# Patient Record
Sex: Female | Born: 1950
Health system: Southern US, Community
[De-identification: ages and names within clinical notes are randomized; demographics above are authoritative.]

## PROBLEM LIST (undated history)

## (undated) DIAGNOSIS — F329 Major depressive disorder, single episode, unspecified: Secondary | ICD-10-CM

## (undated) DIAGNOSIS — I10 Essential (primary) hypertension: Secondary | ICD-10-CM

## (undated) DIAGNOSIS — E119 Type 2 diabetes mellitus without complications: Secondary | ICD-10-CM

## (undated) DIAGNOSIS — I499 Cardiac arrhythmia, unspecified: Secondary | ICD-10-CM

## (undated) DIAGNOSIS — F32A Depression, unspecified: Secondary | ICD-10-CM

## (undated) DIAGNOSIS — D649 Anemia, unspecified: Secondary | ICD-10-CM

## (undated) DIAGNOSIS — F419 Anxiety disorder, unspecified: Secondary | ICD-10-CM

## (undated) DIAGNOSIS — M1990 Unspecified osteoarthritis, unspecified site: Secondary | ICD-10-CM

## (undated) HISTORY — PX: ABDOMINAL SURGERY: SHX537

## (undated) HISTORY — DX: Essential (primary) hypertension: I10

## (undated) HISTORY — PX: ABDOMINAL HYSTERECTOMY: SHX81

## (undated) HISTORY — PX: REFRACTIVE SURGERY: SHX103

## (undated) HISTORY — PX: CHOLECYSTECTOMY: SHX55

---

## 2005-12-01 ENCOUNTER — Emergency Department (HOSPITAL_COMMUNITY): Admission: EM | Admit: 2005-12-01 | Discharge: 2005-12-01 | Payer: Self-pay | Admitting: Emergency Medicine

## 2006-01-24 ENCOUNTER — Ambulatory Visit (HOSPITAL_COMMUNITY): Admission: RE | Admit: 2006-01-24 | Discharge: 2006-01-24 | Payer: Self-pay | Admitting: Family Medicine

## 2007-06-11 ENCOUNTER — Ambulatory Visit (HOSPITAL_COMMUNITY): Admission: RE | Admit: 2007-06-11 | Discharge: 2007-06-11 | Payer: Self-pay | Admitting: Family Medicine

## 2007-11-05 ENCOUNTER — Ambulatory Visit (HOSPITAL_COMMUNITY): Admission: RE | Admit: 2007-11-05 | Discharge: 2007-11-05 | Payer: Self-pay | Admitting: Obstetrics and Gynecology

## 2009-03-04 ENCOUNTER — Ambulatory Visit (HOSPITAL_COMMUNITY): Admission: RE | Admit: 2009-03-04 | Discharge: 2009-03-04 | Payer: Self-pay | Admitting: Family Medicine

## 2012-02-26 ENCOUNTER — Other Ambulatory Visit (HOSPITAL_COMMUNITY): Payer: Self-pay | Admitting: Family Medicine

## 2012-02-26 DIAGNOSIS — Z139 Encounter for screening, unspecified: Secondary | ICD-10-CM

## 2012-03-06 ENCOUNTER — Telehealth: Payer: Self-pay

## 2012-03-06 NOTE — Telephone Encounter (Signed)
LMOM to call.

## 2012-03-11 NOTE — Telephone Encounter (Signed)
LMOM to call.

## 2012-03-12 NOTE — Telephone Encounter (Signed)
Pt called and said that her husband has been in [facility] and having some problems at this time. She will wait til later and schedule colonoscopy. I will send letter to PCP. She will call when ready. York Spaniel she is not having any problems).

## 2012-04-01 ENCOUNTER — Ambulatory Visit (HOSPITAL_COMMUNITY)
Admission: RE | Admit: 2012-04-01 | Discharge: 2012-04-01 | Disposition: A | Discharge status: Home / Self Care | Payer: BC Managed Care – PPO | Source: Ambulatory Visit | Attending: Family Medicine | Admitting: Family Medicine

## 2012-04-01 DIAGNOSIS — Z139 Encounter for screening, unspecified: Secondary | ICD-10-CM

## 2012-04-01 DIAGNOSIS — Z1231 Encounter for screening mammogram for malignant neoplasm of breast: Secondary | ICD-10-CM | POA: Insufficient documentation

## 2012-04-04 ENCOUNTER — Other Ambulatory Visit: Payer: Self-pay | Admitting: Family Medicine

## 2012-04-04 DIAGNOSIS — R928 Other abnormal and inconclusive findings on diagnostic imaging of breast: Secondary | ICD-10-CM

## 2012-04-16 ENCOUNTER — Other Ambulatory Visit: Payer: Self-pay | Admitting: Family Medicine

## 2012-04-16 ENCOUNTER — Ambulatory Visit (HOSPITAL_COMMUNITY)
Admission: RE | Admit: 2012-04-16 | Discharge: 2012-04-16 | Disposition: A | Discharge status: Home / Self Care | Payer: BC Managed Care – PPO | Source: Ambulatory Visit | Attending: Family Medicine | Admitting: Family Medicine

## 2012-04-16 DIAGNOSIS — R928 Other abnormal and inconclusive findings on diagnostic imaging of breast: Secondary | ICD-10-CM

## 2012-04-16 DIAGNOSIS — N6009 Solitary cyst of unspecified breast: Secondary | ICD-10-CM | POA: Insufficient documentation

## 2012-11-18 ENCOUNTER — Other Ambulatory Visit (HOSPITAL_COMMUNITY): Payer: Self-pay | Admitting: Family Medicine

## 2012-11-18 DIAGNOSIS — Z139 Encounter for screening, unspecified: Secondary | ICD-10-CM

## 2012-11-19 ENCOUNTER — Other Ambulatory Visit (HOSPITAL_COMMUNITY): Payer: Self-pay | Admitting: Family Medicine

## 2012-11-19 DIAGNOSIS — M810 Age-related osteoporosis without current pathological fracture: Secondary | ICD-10-CM

## 2012-11-19 DIAGNOSIS — Z09 Encounter for follow-up examination after completed treatment for conditions other than malignant neoplasm: Secondary | ICD-10-CM

## 2012-11-19 DIAGNOSIS — Z139 Encounter for screening, unspecified: Secondary | ICD-10-CM

## 2012-11-20 ENCOUNTER — Ambulatory Visit (HOSPITAL_COMMUNITY)
Admission: RE | Admit: 2012-11-20 | Discharge: 2012-11-20 | Disposition: A | Discharge status: Home / Self Care | Payer: BC Managed Care – PPO | Source: Ambulatory Visit | Attending: Family Medicine | Admitting: Family Medicine

## 2012-11-20 DIAGNOSIS — Z780 Asymptomatic menopausal state: Secondary | ICD-10-CM | POA: Insufficient documentation

## 2012-11-20 DIAGNOSIS — M810 Age-related osteoporosis without current pathological fracture: Secondary | ICD-10-CM

## 2012-11-20 DIAGNOSIS — Z139 Encounter for screening, unspecified: Secondary | ICD-10-CM

## 2012-11-26 ENCOUNTER — Ambulatory Visit (HOSPITAL_COMMUNITY)
Admission: RE | Admit: 2012-11-26 | Discharge: 2012-11-26 | Disposition: A | Discharge status: Home / Self Care | Payer: BC Managed Care – PPO | Source: Ambulatory Visit | Attending: Family Medicine | Admitting: Family Medicine

## 2012-11-26 DIAGNOSIS — Z09 Encounter for follow-up examination after completed treatment for conditions other than malignant neoplasm: Secondary | ICD-10-CM

## 2012-11-26 DIAGNOSIS — R928 Other abnormal and inconclusive findings on diagnostic imaging of breast: Secondary | ICD-10-CM | POA: Insufficient documentation

## 2012-11-27 ENCOUNTER — Telehealth: Payer: Self-pay

## 2012-11-27 NOTE — Telephone Encounter (Signed)
Pt was referred to [place] some time back and letter was mailed in February. Pt's husband had been sick with cancer and has passed. Patient is ready now to schedule her colonoscopy and would like to have it before the end of the year if possible. [phone]

## 2012-12-02 NOTE — Telephone Encounter (Signed)
Called pt and she said she is not quite sure she wants to do it just yet, let her think about it and she will give me a call back.

## 2012-12-09 ENCOUNTER — Other Ambulatory Visit (INDEPENDENT_AMBULATORY_CARE_PROVIDER_SITE_OTHER): Payer: Self-pay | Admitting: *Deleted

## 2012-12-09 ENCOUNTER — Telehealth (INDEPENDENT_AMBULATORY_CARE_PROVIDER_SITE_OTHER): Payer: Self-pay | Admitting: *Deleted

## 2012-12-09 ENCOUNTER — Encounter (INDEPENDENT_AMBULATORY_CARE_PROVIDER_SITE_OTHER): Payer: Self-pay | Admitting: *Deleted

## 2012-12-09 DIAGNOSIS — Z1211 Encounter for screening for malignant neoplasm of colon: Secondary | ICD-10-CM

## 2012-12-09 MED ORDER — PEG-KCL-NACL-NASULF-NA ASC-C 100 G PO SOLR: 1.0000 | Freq: Once | ORAL | Status: DC

## 2012-12-09 NOTE — Telephone Encounter (Signed)
Patient needs movi prep 

## 2012-12-09 NOTE — Telephone Encounter (Signed)
  Procedure: tcs  Reason/Indication:  screening  Has patient had this procedure before?  no  If so, when, by whom and where?    Is there a family history of colon cancer?  no  Who?  What age when diagnosed?    Is patient diabetic?   Pre-diabetic      Does patient have prosthetic heart valve?  no  Do you have a pacemaker?  no  Has patient ever had endocarditis? no  Has patient had joint replacement within last 12 months?  no  Does patient tend to be constipated or take laxatives? no  Is patient on Coumadin, Plavix and/or Aspirin? yes  Medications: asa 81 mg daily, quinapril 40 mg daily, taztia 360 mg daily, hctz 25 mg daily, fish ol daily, alprazolam 0.5 mg prn  Allergies:   Medication Adjustment: asa 2 days  Procedure date & time: 01/02/13 at 825

## 2012-12-09 NOTE — Telephone Encounter (Signed)
agree

## 2012-12-23 NOTE — Telephone Encounter (Signed)
Pt is scheduled with Dr. Karilyn Cota for colonoscopy on 01/02/2013 per epic. Reminder letter that I wrote to her was not mailed.

## 2012-12-24 ENCOUNTER — Encounter (HOSPITAL_COMMUNITY): Payer: Self-pay | Admitting: Pharmacy Technician

## 2013-01-02 ENCOUNTER — Ambulatory Visit (HOSPITAL_COMMUNITY)
Admission: RE | Admit: 2013-01-02 | Discharge: 2013-01-02 | Disposition: A | Discharge status: Home / Self Care | Payer: BC Managed Care – PPO | Source: Ambulatory Visit | Attending: Internal Medicine | Admitting: Internal Medicine

## 2013-01-02 ENCOUNTER — Encounter (HOSPITAL_COMMUNITY): Payer: Self-pay | Admitting: *Deleted

## 2013-01-02 ENCOUNTER — Encounter (HOSPITAL_COMMUNITY)
Admission: RE | Disposition: A | Discharge status: Home / Self Care | Payer: Self-pay | Source: Ambulatory Visit | Attending: Internal Medicine

## 2013-01-02 DIAGNOSIS — D126 Benign neoplasm of colon, unspecified: Secondary | ICD-10-CM | POA: Insufficient documentation

## 2013-01-02 DIAGNOSIS — Z7982 Long term (current) use of aspirin: Secondary | ICD-10-CM | POA: Insufficient documentation

## 2013-01-02 DIAGNOSIS — Z79899 Other long term (current) drug therapy: Secondary | ICD-10-CM | POA: Insufficient documentation

## 2013-01-02 DIAGNOSIS — K644 Residual hemorrhoidal skin tags: Secondary | ICD-10-CM

## 2013-01-02 DIAGNOSIS — I10 Essential (primary) hypertension: Secondary | ICD-10-CM | POA: Insufficient documentation

## 2013-01-02 DIAGNOSIS — Z1211 Encounter for screening for malignant neoplasm of colon: Secondary | ICD-10-CM

## 2013-01-02 DIAGNOSIS — K5730 Diverticulosis of large intestine without perforation or abscess without bleeding: Secondary | ICD-10-CM | POA: Insufficient documentation

## 2013-01-02 HISTORY — DX: Depression, unspecified: F32.A

## 2013-01-02 HISTORY — DX: Essential (primary) hypertension: I10

## 2013-01-02 HISTORY — DX: Major depressive disorder, single episode, unspecified: F32.9

## 2013-01-02 HISTORY — PX: COLONOSCOPY: SHX5424

## 2013-01-02 HISTORY — DX: Anxiety disorder, unspecified: F41.9

## 2013-01-02 SURGERY — COLONOSCOPY
Anesthesia: Moderate Sedation

## 2013-01-02 MED ORDER — STERILE WATER FOR IRRIGATION IR SOLN
Status: DC | PRN
  Administered 2013-01-02: 09:00:00

## 2013-01-02 MED ORDER — MEPERIDINE HCL 50 MG/ML IJ SOLN
INTRAMUSCULAR | Status: DC | PRN
  Administered 2013-01-02 (×2): 25 mg via INTRAVENOUS

## 2013-01-02 MED ORDER — MIDAZOLAM HCL 5 MG/5ML IJ SOLN
INTRAMUSCULAR | Status: AC
  Filled 2013-01-02: qty 10

## 2013-01-02 MED ORDER — MEPERIDINE HCL 50 MG/ML IJ SOLN
INTRAMUSCULAR | Status: AC
  Filled 2013-01-02: qty 1

## 2013-01-02 MED ORDER — MIDAZOLAM HCL 5 MG/5ML IJ SOLN
INTRAMUSCULAR | Status: DC | PRN
  Administered 2013-01-02 (×4): 2 mg via INTRAVENOUS

## 2013-01-02 MED ORDER — SODIUM CHLORIDE 0.9 % IV SOLN
INTRAVENOUS | Status: DC
  Administered 2013-01-02: 08:00:00 via INTRAVENOUS

## 2013-01-02 NOTE — Op Note (Signed)
COLONOSCOPY PROCEDURE REPORT  PATIENT:  Patricia Sharp  MR#:  [id number] Birthdate:  Jun 10, 1950, 62 y.o., female Endoscopist:  Dr. Malissa Hippo, MD Referred By:  Dr. Kirk Ruths, MD  Procedure Date: 01/02/2013  Procedure:   Colonoscopy  Indications:  Patient is 62 year old Caucasian female was undergoing average risk screening colonoscopy.  Informed Consent:  The procedure and risks were reviewed with the patient and informed consent was obtained.  Medications:  Demerol 50 mg IV Versed 8 mg IV  Description of procedure:  After a digital rectal exam was performed, that colonoscope was advanced from the anus through the rectum and colon to the area of the cecum, ileocecal valve and appendiceal orifice. The cecum was deeply intubated. These structures were well-seen and photographed for the record. From the level of the cecum and ileocecal valve, the scope was slowly and cautiously withdrawn. The mucosal surfaces were carefully surveyed utilizing scope tip to flexion to facilitate fold flattening as needed. The scope was pulled down into the rectum where a thorough exam including retroflexion was performed.  Findings:   Prep satisfactory. Two small polyps removed from the sigmoid colon. One polyp was cold snared and other removed using cold biopsy forceps. Few diverticula at sigmoid colon. Normal rectal mucosa. Small hemorrhoids below the dentate line and anal papillae.   Therapeutic/Diagnostic Maneuvers Performed:  See above  Complications:  None  Cecal Withdrawal Time:  11 minutes  Impression:  Examination performed to cecum. Two small polyps removed from the sigmoid colon; one was cold snared and other one removed using biopsy forceps. Mild sigmoid colon diverticulosis. Small external hemorrhoids and anal papillae.  Recommendations:  Standard instructions given. I will contact patient with biopsy results and further recommendations.  Kaysa Roulhac U  01/02/2013  9:00 AM  CC: Dr. Kirk Ruths, MD & Dr. Bonnetta Barry ref. provider found

## 2013-01-02 NOTE — H&P (Signed)
Patricia Sharp is an 62 y.o. female.   Chief Complaint: Patient is here for colonoscopy. HPI: Patient is 10-year-old Caucasian female who is here for screening colonoscopy. She denies abdominal pain change in her bowel habits or rectal bleeding. Family history is negative for CRC. This is patient's first exam.  Past Medical History  Diagnosis Date  . Anxiety   . Hypertension   . Depression     Past Surgical History  Procedure Laterality Date  . Abdominal hysterectomy    . Abdominal surgery      to remove fibroid tumors  . Refractive surgery Bilateral     Family History  Problem Relation Age of Onset  . Colon cancer Neg Hx    Social History:  reports that she has never smoked. She does not have any smokeless tobacco history on file. She reports that she does not drink alcohol or use illicit drugs.  Allergies: No Known Allergies  Medications Prior to Admission  Medication Sig Dispense Refill  . ALPRAZolam (XANAX) 0.5 MG tablet Take 0.5 mg by mouth at bedtime as needed for anxiety.      Marland Kitchen diltiazem (TIAZAC) 360 MG 24 hr capsule Take 360 mg by mouth daily.      . hydrochlorothiazide (HYDRODIURIL) 25 MG tablet Take 25 mg by mouth daily.      . Omega-3 Fatty Acids (FISH OIL) 1000 MG CAPS Take 1,000 mg by mouth daily.      . peg 3350 powder (MOVIPREP) 100 G SOLR Take 1 kit (200 g total) by mouth once.  1 kit  0  . quinapril (ACCUPRIL) 40 MG tablet Take 40 mg by mouth at bedtime.      Marland Kitchen aspirin EC 81 MG tablet Take 81 mg by mouth daily.        No results found for this or any previous visit (from the past 48 hour(s)). No results found.  ROS  Blood pressure 165/68, pulse 70, temperature 97.7 F (36.5 C), temperature source Oral, resp. rate 13, height 5\' 1"  (1.549 m), weight 156 lb (70.761 kg), SpO2 97.00%. Physical Exam  Constitutional: She appears well-developed and well-nourished.  HENT:  Mouth/Throat: Oropharynx is clear and moist.  Eyes: Conjunctivae are normal. No scleral  icterus.  Neck: No thyromegaly present.  Cardiovascular: Normal rate, regular rhythm and normal heart sounds.   Respiratory: Effort normal and breath sounds normal.  GI: Soft. She exhibits no distension and no mass. There is no tenderness.  Musculoskeletal: She exhibits no edema.  Lymphadenopathy:    She has no cervical adenopathy.  Neurological: She is alert.  Skin: Skin is warm and dry.     Assessment/Plan Average risk screening colonoscopy.  Cayce Quezada U 01/02/2013, 8:26 AM

## 2013-01-06 ENCOUNTER — Encounter (HOSPITAL_COMMUNITY): Payer: Self-pay | Admitting: Internal Medicine

## 2013-01-13 ENCOUNTER — Encounter (INDEPENDENT_AMBULATORY_CARE_PROVIDER_SITE_OTHER): Payer: Self-pay | Admitting: *Deleted

## 2014-03-08 ENCOUNTER — Encounter: Payer: Self-pay | Admitting: Obstetrics and Gynecology

## 2014-03-08 ENCOUNTER — Ambulatory Visit (INDEPENDENT_AMBULATORY_CARE_PROVIDER_SITE_OTHER): Payer: BLUE CROSS/BLUE SHIELD | Admitting: Obstetrics and Gynecology

## 2014-03-08 VITALS — BP 140/70 | Ht 61.0 in | Wt 165.8 lb

## 2014-03-08 DIAGNOSIS — N765 Ulceration of vagina: Secondary | ICD-10-CM

## 2014-03-08 MED ORDER — ESTROGENS, CONJUGATED 0.625 MG/GM VA CREA: 0.5000 | TOPICAL_CREAM | Freq: Every day | VAGINAL | Status: DC

## 2014-03-08 MED ORDER — ACYCLOVIR 5 % EX CREA: 1.0000 "application " | TOPICAL_CREAM | CUTANEOUS | Status: DC

## 2014-03-08 NOTE — Progress Notes (Signed)
Patient ID: [name], female   DOB: [birth year], 64 y.o.   MRN: [id number]   [facility] Visit  Patient name: Patricia Sharp MRN [id number]  Date of birth: Feb 23, 1950  CC & HPI:  Patricia Sharp is a 64 y.o. female s/p abdominal hysterectomy presenting today for 3 sores on her buttocks that started last week. Pt states pain becomes worse with sitting. She notes diarrhea 1 week ago and thinks episodes may be cause of the sore. Pt is widowed and has been abstinent, but is considering becoming sexual active again. She has tight band that restricts opening of vagina. Pt denies swollen lymph nodes or similar sores around the mouth.    ROS:  A complete 10 system review of systems was obtained and all systems are negative except as noted in the HPI and PMH.    Pertinent History Reviewed:   Reviewed: Significant for abdominal hysterectomy Medical         Past Medical History  Diagnosis Date  . Anxiety   . Hypertension   . Depression                               Surgical Hx:    Past Surgical History  Procedure Laterality Date  . Abdominal hysterectomy    . Abdominal surgery      to remove fibroid tumors  . Refractive surgery Bilateral   . Colonoscopy N/A 01/02/2013    Procedure: COLONOSCOPY;  Surgeon: Rogene Houston, MD;  [facility];  Service: Endoscopy;  Laterality: N/A;  825   Medications: Reviewed & Updated - see associated section                       Current outpatient prescriptions:  .  ALPRAZolam (XANAX) 0.5 MG tablet, Take 0.5 mg by mouth at bedtime as needed for anxiety., Disp: , Rfl:  .  aspirin EC 81 MG tablet, Take 81 mg by mouth daily., Disp: , Rfl:  .  diltiazem (TIAZAC) 360 MG 24 hr capsule, Take 360 mg by mouth daily., Disp: , Rfl:  .  hydrochlorothiazide (HYDRODIURIL) 25 MG tablet, Take 25 mg by mouth daily., Disp: , Rfl:  .  Omega-3 Fatty Acids (FISH OIL) 1000 MG CAPS, Take 1,000 mg by mouth daily., Disp: , Rfl:  .  quinapril (ACCUPRIL) 40 MG  tablet, Take 40 mg by mouth at bedtime., Disp: , Rfl:    Social History: Reviewed -  reports that she has never smoked. She does not have any smokeless tobacco history on file.  Objective Findings:  Vitals: Blood pressure 140/70, height 5\' 1"  (1.549 m), weight 165 lb 12.8 oz (75.206 kg).  Physical Examination: General appearance - alert, well appearing, and in no distress and oriented to person, place, and time Pelvic - normal external genitalia, vulva, vagina, cervix VULVA: normal appearing vulva with no masses, tenderness or lesions VAGINA: normal appearing vagina with normal color and discharge, no lesions; average vaginal length; atrophic tissues CERVIX: normal appearing cervix without discharge or lesions UTERUS: surgically removed ADNEXA: surgically removed Buttocks: Rapidly healing 3 mm x 3 mm ulceration on right gluteal area; 2 actively healing small 1 mm ulcerations on the left side of introitus  Assessment & Plan:   A:  1. Rapidly healing ulcerations; 1 on gluteal area and 2 left side of introitus 2. Hymen band causing vaginal tightness 3. Postmenopausal state  P:  1. Estrogen per vagina. 2. Follow-up in 2 weeks regarding hymen band 3. Topical anti-viral cream acyclovir.  This chart was scribed for Jonnie Kind, MD by Tula Nakayama, ED Scribe. This patient was seen in room 2 and the patient's care was started at 10:18 AM.   I personally performed the services described in this documentation, which was scribed in my presence. The recorded information has been reviewed and considered accurate. It has been edited as necessary during review. Jonnie Kind, MD

## 2014-03-09 LAB — HSV 2 ANTIBODY, IGG

## 2014-03-25 ENCOUNTER — Ambulatory Visit (INDEPENDENT_AMBULATORY_CARE_PROVIDER_SITE_OTHER): Payer: BC Managed Care – PPO | Admitting: Obstetrics and Gynecology

## 2014-03-25 VITALS — BP 140/76 | Ht 61.0 in | Wt 165.0 lb

## 2014-03-25 DIAGNOSIS — N9089 Other specified noninflammatory disorders of vulva and perineum: Secondary | ICD-10-CM

## 2014-03-25 NOTE — Progress Notes (Signed)
Patient ID: [name], female   DOB: [birth year], 64 y.o.   MRN: [id number] This chart was SCRIBED for Mallory Shirk, MD by Stephania Fragmin, ED Scribe. This patient was seen in room 2 and the patient's care was started at 4:46 PM.   [facility] Visit  Patient name: AMAZIAH GHOSH MRN [id number]  Date of birth: May 05, 1950 CC & HPI:  CELIE DESROCHERS is a 64 y.o. female s/p abdominal hysterectomy presenting today for follow up of sores on her buttocks and vaginal thinning after being seen by me 2.5 weeks ago, on 03/08/14. She reports that she has pain relief on her buttocks since being treated. She is continuing to use Premarin.   Patient reports having a history of Cesarean section.   ROS:  A complete 10 system review of systems was obtained and all systems are negative except as noted in the HPI and PMH.    Pertinent History Reviewed:   Reviewed: Significant for abdominal hysterectomy and removal of fibroid tumors Medical         Past Medical History  Diagnosis Date  . Anxiety   . Hypertension   . Depression                               Surgical Hx:    Past Surgical History  Procedure Laterality Date  . Abdominal hysterectomy    . Abdominal surgery      to remove fibroid tumors  . Refractive surgery Bilateral   . Colonoscopy N/A 01/02/2013    Procedure: COLONOSCOPY;  Surgeon: Rogene Houston, MD;  [facility];  Service: Endoscopy;  Laterality: N/A;  825   Medications: Reviewed & Updated - see associated section                       Current outpatient prescriptions:  .  acyclovir cream (ZOVIRAX) 5 %, Apply 1 application topically every 4 (four) hours. While awake., Disp: 15 g, Rfl: 0 .  ALPRAZolam (XANAX) 0.5 MG tablet, Take 0.5 mg by mouth at bedtime as needed for anxiety., Disp: , Rfl:  .  aspirin EC 81 MG tablet, Take 81 mg by mouth daily., Disp: , Rfl:  .  conjugated estrogens (PREMARIN) vaginal cream, Place 0.5 Applicatorfuls vaginally at bedtime. For vaginal  thinning, Disp: 42.5 g, Rfl: 12 .  diltiazem (TIAZAC) 360 MG 24 hr capsule, Take 360 mg by mouth daily., Disp: , Rfl:  .  hydrochlorothiazide (HYDRODIURIL) 25 MG tablet, Take 25 mg by mouth daily., Disp: , Rfl:  .  Omega-3 Fatty Acids (FISH OIL) 1000 MG CAPS, Take 1,000 mg by mouth daily., Disp: , Rfl:  .  quinapril (ACCUPRIL) 40 MG tablet, Take 40 mg by mouth at bedtime., Disp: , Rfl:    Social History: Reviewed -  reports that she has never smoked. She does not have any smokeless tobacco history on file.  Objective Findings:  Vitals: Blood pressure 140/76, height 5\' 1"  (1.549 m), weight 165 lb (74.844 kg).  Physical Examination: General appearance - alert, well appearing, and in no distress, oriented to person, place, and time and overweight Mental status - alert, oriented to person, place, and time, normal mood, behavior, speech, dress, motor activity, and thought processes, affect appropriate to mood   Pelvic - normal external genitalia VULVA: normal appearing vulva with no masses, tenderness or lesions,  VAGINA: Hymen band remains intact,  but does not preclude large speculum  CERVIX: normal appearing cervix without discharge or lesions,  UTERUS: uterus is normal size, shape, consistency and nontender,  ADNEXA: normal adnexa in size, nontender and no masses,   Assessment & Plan:   A:   1. Vaginal dryness S/P abdominal hysterectomy   P:  1. Will give names of lubricants for patient to use, [place], [place], etc.   I personally performed the services described in this documentation, which was SCRIBED in my presence. The recorded information has been reviewed and considered accurate. It has been edited as necessary during review. Jonnie Kind, MD

## 2014-03-26 ENCOUNTER — Ambulatory Visit: Payer: BLUE CROSS/BLUE SHIELD | Admitting: Obstetrics and Gynecology

## 2014-03-31 ENCOUNTER — Telehealth: Payer: Self-pay | Admitting: *Deleted

## 2014-04-01 ENCOUNTER — Ambulatory Visit: Payer: BC Managed Care – PPO | Admitting: Obstetrics and Gynecology

## 2014-04-01 NOTE — Telephone Encounter (Signed)
Pt states that she has something very personal that she wants to discuss with Dr. Glo Herring. Pt refused to give me any information. Pt was given appointment for in the morning with Dr. Glo Herring at 9:45.

## 2014-04-02 ENCOUNTER — Encounter: Payer: Self-pay | Admitting: Obstetrics and Gynecology

## 2014-04-02 ENCOUNTER — Ambulatory Visit (INDEPENDENT_AMBULATORY_CARE_PROVIDER_SITE_OTHER): Payer: BC Managed Care – PPO | Admitting: Obstetrics and Gynecology

## 2014-04-02 VITALS — BP 220/100 | Ht 61.0 in | Wt 169.0 lb

## 2014-04-02 DIAGNOSIS — N930 Postcoital and contact bleeding: Secondary | ICD-10-CM | POA: Diagnosis not present

## 2014-04-02 DIAGNOSIS — IMO0002 Reserved for concepts with insufficient information to code with codable children: Secondary | ICD-10-CM

## 2014-04-02 DIAGNOSIS — N941 Unspecified dyspareunia: Secondary | ICD-10-CM | POA: Insufficient documentation

## 2014-04-02 NOTE — Progress Notes (Addendum)
Patient ID: [name], female   DOB: Mar 04, 1950, 64 y.o.   MRN: [id number]    [facility] Visit  Patient name: Patricia Sharp MRN [id number]  Date of birth: 08/01/1950  CC & HPI:  Patricia Sharp is a 64 y.o. female presenting today for intermittent vaginal bleeding. Pt was seen here on 2/22 regarding a restrictive hymen band and vaginal dryness. She reports recent intercourse caused bleeding and pain. Pt has tried lubricant and Premarin for her symptoms.  Had bleeding last nite with coitus.  ROS:  A complete 10 system review of systems was obtained and all systems are negative except as noted in the HPI and PMH.   Pertinent History Reviewed:   Reviewed: Significant for abdominal hysterectomy Medical         Past Medical History  Diagnosis Date  . Anxiety   . Hypertension   . Depression                               Surgical Hx:    Past Surgical History  Procedure Laterality Date  . Abdominal hysterectomy    . Abdominal surgery      to remove fibroid tumors  . Refractive surgery Bilateral   . Colonoscopy N/A 01/02/2013    Procedure: COLONOSCOPY;  Surgeon: Rogene Houston, MD;  [facility];  Service: Endoscopy;  Laterality: N/A;  825   Medications: Reviewed & Updated - see associated section                       Current outpatient prescriptions:  .  acyclovir cream (ZOVIRAX) 5 %, Apply 1 application topically every 4 (four) hours. While awake., Disp: 15 g, Rfl: 0 .  ALPRAZolam (XANAX) 0.5 MG tablet, Take 0.5 mg by mouth at bedtime as needed for anxiety., Disp: , Rfl:  .  aspirin EC 81 MG tablet, Take 81 mg by mouth daily., Disp: , Rfl:  .  conjugated estrogens (PREMARIN) vaginal cream, Place 0.5 Applicatorfuls vaginally at bedtime. For vaginal thinning, Disp: 42.5 g, Rfl: 12 .  diltiazem (TIAZAC) 360 MG 24 hr capsule, Take 360 mg by mouth daily., Disp: , Rfl:  .  hydrochlorothiazide (HYDRODIURIL) 25 MG tablet, Take 25 mg by mouth daily., Disp: , Rfl:  .   Omega-3 Fatty Acids (FISH OIL) 1000 MG CAPS, Take 1,000 mg by mouth daily., Disp: , Rfl:  .  quinapril (ACCUPRIL) 40 MG tablet, Take 40 mg by mouth at bedtime., Disp: , Rfl:    Social History: Reviewed -  reports that she has never smoked. She does not have any smokeless tobacco history on file.  Objective Findings:  Vitals: Blood pressure 220/100, height 5\' 1"  (1.549 m), weight 169 lb (76.658 kg).  Physical Examination: General appearance - alert, well appearing, and in no distress and oriented to person, place, and time Mental status - alert, oriented to person, place, and time, normal mood, behavior, speech, dress, motor activity, and thought processes Pelvic - normal external genitalia, vulva, vagina VULVA: normal appearing vulva with no masses, tenderness or lesions VAGINA: normal appearing vagina with normal color and discharge, no lesions; restrictive hymen band causing bleeding with releasing incisons done at 5, 7 oclock after local anesth, as per pt request.. Procedure confirmed, allergies reviewedl. CERVIX: surgically absent UTERUS: surgically absent ADNEXA: surgically absent  10:36 AM 2 releasing incisions made on hymen band at 5  and 7 o'clock  Assessment & Plan:   A:  1. Restrictive hymen band causing bleeding 2. 2 releasing incisions made on hymen band at 5 and 7 o'clock  P:  1. Follow-up PRN.    This chart was scribed for Jonnie Kind, MD by Tula Nakayama, Medical Scribe. This patient was seen in room 2 and the patient's care was started at 10:13 AM.   I personally performed the services described in this documentation, which was SCRIBED in my presence. The recorded information has been reviewed and considered accurate. It has been edited as necessary during review. Jonnie Kind, MD

## 2014-04-02 NOTE — Progress Notes (Signed)
Patient ID: Patricia Sharp, female   DOB: April 24, 1950, 64 y.o.   MRN: [id number] Pt here today to discuss a personal problem with Dr. Glo Herring. Pt would not give any details.

## 2014-05-04 ENCOUNTER — Telehealth: Payer: Self-pay | Admitting: *Deleted

## 2014-05-06 NOTE — Telephone Encounter (Signed)
Pt is s/p bilateral hymenotomy to reduce dyspareunia. Pt's partner is having difficulties with maintaining erection.  Pt will make appt to see if further surgery would be beneficial

## 2014-05-31 ENCOUNTER — Other Ambulatory Visit: Payer: Self-pay | Admitting: Obstetrics and Gynecology

## 2015-03-17 ENCOUNTER — Other Ambulatory Visit (HOSPITAL_COMMUNITY): Payer: Self-pay | Admitting: Family Medicine

## 2015-03-17 DIAGNOSIS — N6002 Solitary cyst of left breast: Secondary | ICD-10-CM

## 2015-03-17 DIAGNOSIS — M8580 Other specified disorders of bone density and structure, unspecified site: Secondary | ICD-10-CM

## 2015-03-17 DIAGNOSIS — Z780 Asymptomatic menopausal state: Secondary | ICD-10-CM

## 2015-03-23 ENCOUNTER — Other Ambulatory Visit (HOSPITAL_COMMUNITY): Payer: BC Managed Care – PPO

## 2015-03-29 ENCOUNTER — Ambulatory Visit (HOSPITAL_COMMUNITY)
Admission: RE | Admit: 2015-03-29 | Discharge: 2015-03-29 | Disposition: A | Discharge status: Home / Self Care | Payer: Medicare Other | Source: Ambulatory Visit | Attending: Family Medicine | Admitting: Family Medicine

## 2015-03-29 DIAGNOSIS — Z780 Asymptomatic menopausal state: Secondary | ICD-10-CM | POA: Insufficient documentation

## 2015-03-29 DIAGNOSIS — Z1231 Encounter for screening mammogram for malignant neoplasm of breast: Secondary | ICD-10-CM | POA: Diagnosis present

## 2015-03-29 DIAGNOSIS — M8580 Other specified disorders of bone density and structure, unspecified site: Secondary | ICD-10-CM

## 2015-03-29 DIAGNOSIS — N6002 Solitary cyst of left breast: Secondary | ICD-10-CM

## 2015-04-18 ENCOUNTER — Other Ambulatory Visit: Payer: Self-pay | Admitting: Obstetrics and Gynecology

## 2015-05-02 ENCOUNTER — Ambulatory Visit (INDEPENDENT_AMBULATORY_CARE_PROVIDER_SITE_OTHER): Payer: Medicare Other | Admitting: Obstetrics and Gynecology

## 2015-05-02 ENCOUNTER — Encounter: Payer: Self-pay | Admitting: Obstetrics and Gynecology

## 2015-05-02 VITALS — BP 120/80 | Ht 61.0 in | Wt 169.5 lb

## 2015-05-02 DIAGNOSIS — N898 Other specified noninflammatory disorders of vagina: Secondary | ICD-10-CM

## 2015-05-02 DIAGNOSIS — Z90710 Acquired absence of both cervix and uterus: Secondary | ICD-10-CM | POA: Diagnosis not present

## 2015-05-02 DIAGNOSIS — R300 Dysuria: Secondary | ICD-10-CM | POA: Diagnosis not present

## 2015-05-02 DIAGNOSIS — N941 Unspecified dyspareunia: Secondary | ICD-10-CM

## 2015-05-02 NOTE — Progress Notes (Addendum)
Patient ID: [name], female   DOB: 1950/12/21, 65 y.o.   MRN: [id number]   [facility] Visit  Patient name: Patricia Sharp MRN [id number]  Date of birth: 1950-03-08  CC & HPI:  Patricia Sharp is a 65 y.o. female, with PMHx noted below, presenting today for improving dysuria with associated vaginal irritation.  Pt remains ambivalent about intimate relationship with occasional partner.  ROS:  Review of Systems  Genitourinary: Positive for dysuria.       + for vaginal irritation   All other systems reviewed and are negative.  Pertinent History Reviewed:   Reviewed: Significant for HTN, abd hysterectomy, abd surgery (removal of fibroid tumors)  Medical         Past Medical History  Diagnosis Date  . Anxiety   . Hypertension   . Depression                               Surgical Hx:    Past Surgical History  Procedure Laterality Date  . Abdominal hysterectomy    . Abdominal surgery      to remove fibroid tumors  . Refractive surgery Bilateral   . Colonoscopy N/A 01/02/2013    Procedure: COLONOSCOPY;  Surgeon: Rogene Houston, MD;  [facility];  Service: Endoscopy;  Laterality: N/A;  825   Medications: Reviewed & Updated - see associated section                       Current outpatient prescriptions:  .  acyclovir ointment (ZOVIRAX) 5 %, APPLY TO AFFECTED AREA EVERY 4 HOURS WHILE AWAKE., Disp: 30 g, Rfl: prn .  ALPRAZolam (XANAX) 0.5 MG tablet, Take 0.5 mg by mouth at bedtime as needed for anxiety., Disp: , Rfl:  .  aspirin EC 81 MG tablet, Take 81 mg by mouth daily., Disp: , Rfl:  .  diltiazem (TIAZAC) 360 MG 24 hr capsule, Take 360 mg by mouth daily., Disp: , Rfl:  .  hydrochlorothiazide (HYDRODIURIL) 25 MG tablet, Take 25 mg by mouth daily., Disp: , Rfl:  .  Omega-3 Fatty Acids (FISH OIL) 1000 MG CAPS, Take 1,000 mg by mouth daily., Disp: , Rfl:  .  PREMARIN vaginal cream, APPLY 1/2 APPLICATORFUL VAGINALLY AT BEDTIME FOR VAGINAL THINNING., Disp: 30 g,  Rfl: 3 .  quinapril (ACCUPRIL) 40 MG tablet, Take 40 mg by mouth at bedtime., Disp: , Rfl:    Social History: Reviewed -  reports that she has never smoked. She does not have any smokeless tobacco history on file.  Objective Findings:  Vitals: Blood pressure 120/80, height 5\' 1"  (1.549 m), weight 169 lb 8 oz (76.885 kg).  Physical Examination: General appearance - alert, well appearing, and in no distress and oriented to person, place, and time Pelvic - normal external genitalia, vulva, vagina, cervix, uterus and adnexa,  VULVA: normal appearing vulva with no masses, tenderness or lesions,  VAGINA: normal appearing vagina with normal color and discharge, no lesions, restrictive hymen band was transected at last visit 1 yr ago,  CERVIX: absent,  UTERUS: absent,  ADNEXA: absent    Assessment & Plan:   A:  1. Dyspareunia 2 conflict over illicit relationship 3 atrophic vag tissue, stable on estrace.  P:  1. Lengthy discussion in exam room x 15 mins and again pt with even more concerns requesting further conversation in office. Over relationship,  whether to continue it. 2. Resolved introital stenosis from hymen band. 3     By signing my name below, I, Terressa Koyanagi, attest that this documentation has been prepared under the direction and in the presence of Mallory Shirk, MD. Electronically Signed: Terressa Koyanagi, ED Scribe. 05/02/2015. 4:03 PM.   I personally performed the services described in this documentation, which was SCRIBED in my presence. The recorded information has been reviewed and considered accurate. It has been edited as necessary during review. Jonnie Kind, MD

## 2015-05-02 NOTE — Progress Notes (Signed)
Patient ID: Patricia Sharp, female   DOB: June 21, 1950, 65 y.o.   MRN: [id number] Pt here today for vaginal irritation, burning with urination, and dysuria. Pt states that her symptoms are better but she just wants to be sure. Pt states that she has something that she would like to speak with Dr. Glo Herring alone about.

## 2015-05-13 ENCOUNTER — Telehealth: Payer: Self-pay | Admitting: Obstetrics and Gynecology

## 2015-05-17 NOTE — Telephone Encounter (Signed)
Pt called on phone, she doesn't recall what the medicine was to be for, so there's nothing to call in at this time . Pt okay with this.

## 2016-04-17 ENCOUNTER — Other Ambulatory Visit (HOSPITAL_COMMUNITY): Payer: Self-pay | Admitting: Family Medicine

## 2016-04-17 DIAGNOSIS — Z1231 Encounter for screening mammogram for malignant neoplasm of breast: Secondary | ICD-10-CM

## 2016-04-25 ENCOUNTER — Ambulatory Visit (HOSPITAL_COMMUNITY)
Admission: RE | Admit: 2016-04-25 | Discharge: 2016-04-25 | Disposition: A | Discharge status: Home / Self Care | Payer: Medicare Other | Source: Ambulatory Visit | Attending: Family Medicine | Admitting: Family Medicine

## 2016-04-25 DIAGNOSIS — Z1231 Encounter for screening mammogram for malignant neoplasm of breast: Secondary | ICD-10-CM | POA: Diagnosis present

## 2017-04-03 IMAGING — US US BREAST LTD UNI LEFT INC AXILLA
1 series · 4 of 4 positions shown · non-contrast
Comparison: Previous exam(s).

CLINICAL DATA: Patient presents for short-term evaluation of
probably benign left breast mass.

EXAM:
DIGITAL DIAGNOSTIC BILATERAL MAMMOGRAM WITH 3D TOMOSYNTHESIS WITH
CAD
ULTRASOUND LEFT BREAST

[Series 1: us breast ltd uni left inc axilla · 0.07mm/px · 4 of 4 slices shown]
[im 1/4]
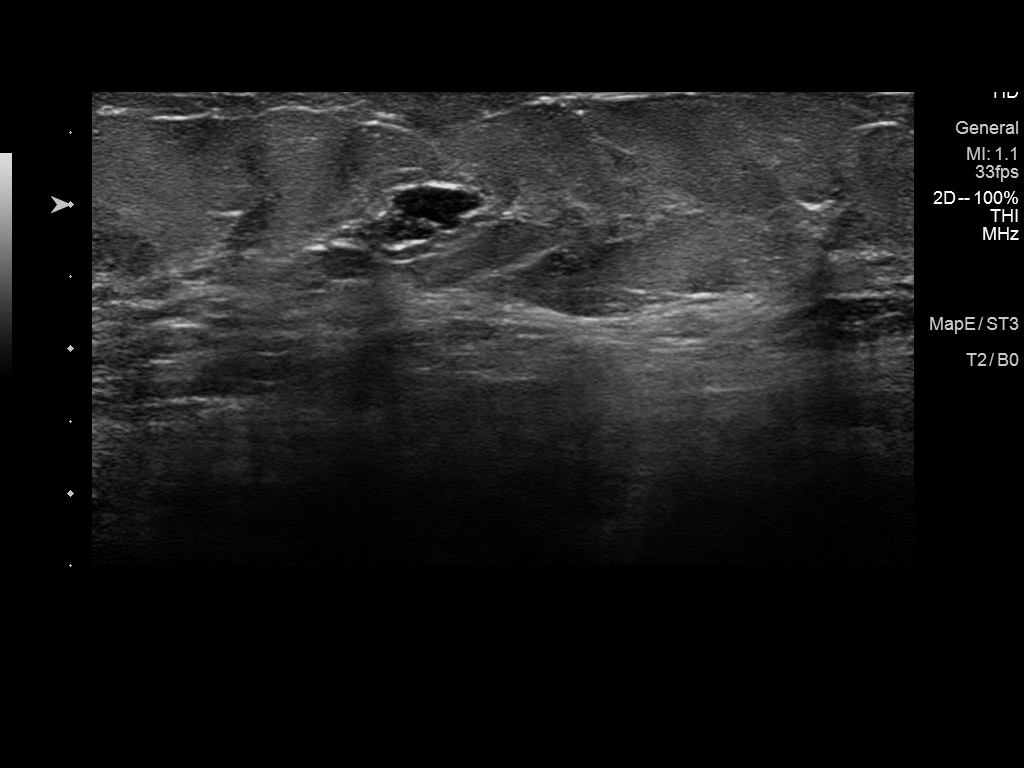
[im 2/4]
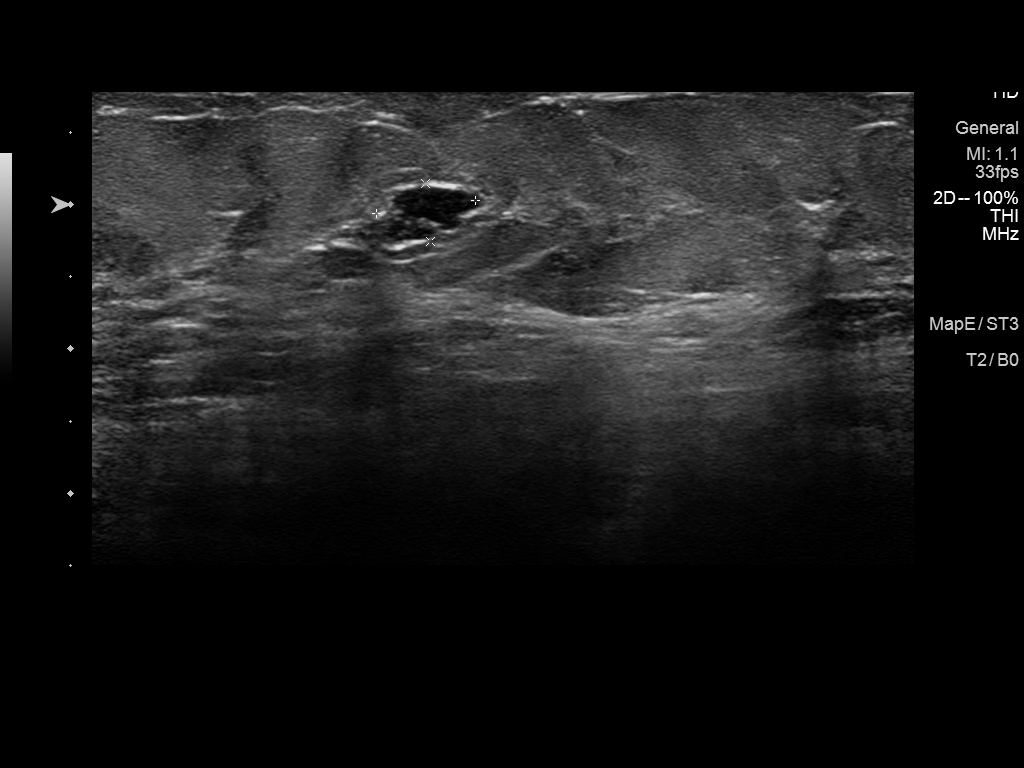
[im 3/4]
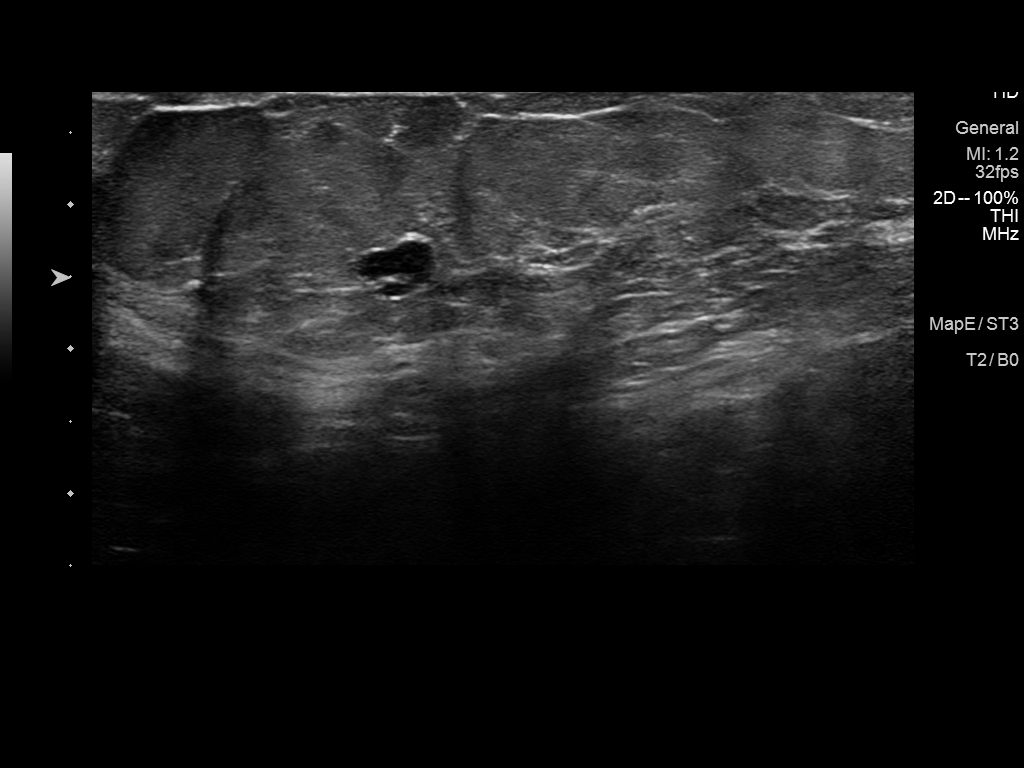
[im 4/4]
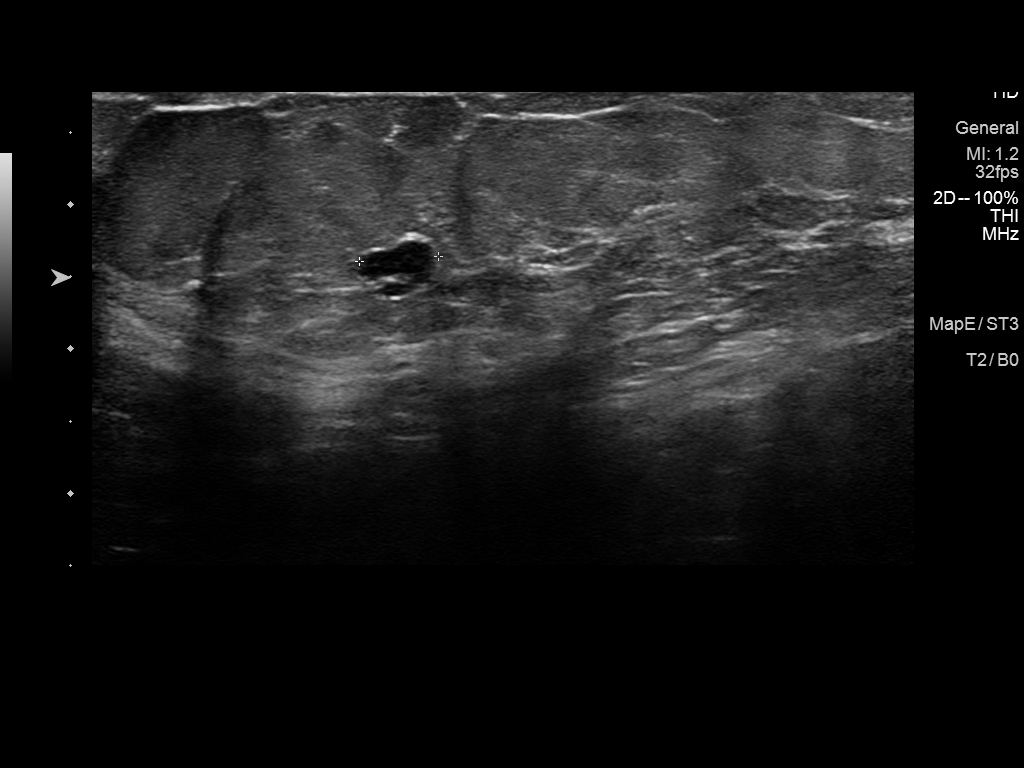

[4 of 4 positions shown; findings below may reference images not displayed]

ACR Breast Density Category b: There are scattered areas of
fibroglandular density.
FINDINGS: Grossly unchanged mass within the 6 o'clock position left breast. No
additional concerning masses, calcifications or architectural
distortion identified within either breast.

Mammographic images were processed with CAD.

On physical exam, I palpate no discrete mass within the 6 o'clock
position left breast.

Targeted ultrasound is performed, showing a 7 x 4 x 6 mm cluster of
cysts within the left breast 6 o'clock position 3 cm from nipple,
grossly unchanged from prior exam.
IMPRESSION: Benign left breast cluster of microcysts.

No mammographic evidence for malignancy.

RECOMMENDATION:
Screening mammogram in one year.(Code:75-H-KN1)

I have discussed the findings and recommendations with the patient.
Results were also provided in writing at the conclusion of the
visit. If applicable, a reminder letter will be sent to the patient
regarding the next appointment.

BI-RADS CATEGORY  2: Benign.

## 2017-04-26 ENCOUNTER — Other Ambulatory Visit (HOSPITAL_COMMUNITY): Payer: Self-pay | Admitting: Family Medicine

## 2017-04-26 DIAGNOSIS — Z1231 Encounter for screening mammogram for malignant neoplasm of breast: Secondary | ICD-10-CM

## 2017-05-01 ENCOUNTER — Ambulatory Visit (HOSPITAL_COMMUNITY)
Admission: RE | Admit: 2017-05-01 | Discharge: 2017-05-01 | Disposition: A | Discharge status: Home / Self Care | Payer: Medicare Other | Source: Ambulatory Visit | Attending: Family Medicine | Admitting: Family Medicine

## 2017-05-01 DIAGNOSIS — Z1231 Encounter for screening mammogram for malignant neoplasm of breast: Secondary | ICD-10-CM | POA: Insufficient documentation

## 2017-06-13 ENCOUNTER — Other Ambulatory Visit (HOSPITAL_COMMUNITY): Payer: Self-pay | Admitting: Physician Assistant

## 2017-06-13 ENCOUNTER — Ambulatory Visit (HOSPITAL_COMMUNITY)
Admission: RE | Admit: 2017-06-13 | Discharge: 2017-06-13 | Disposition: A | Discharge status: Home / Self Care | Payer: Medicare Other | Source: Ambulatory Visit | Attending: Physician Assistant | Admitting: Physician Assistant

## 2017-06-13 DIAGNOSIS — M25562 Pain in left knee: Secondary | ICD-10-CM

## 2017-06-13 DIAGNOSIS — M2342 Loose body in knee, left knee: Secondary | ICD-10-CM | POA: Insufficient documentation

## 2017-06-13 DIAGNOSIS — Z6833 Body mass index (BMI) 33.0-33.9, adult: Secondary | ICD-10-CM | POA: Insufficient documentation

## 2017-06-13 DIAGNOSIS — M25462 Effusion, left knee: Secondary | ICD-10-CM | POA: Insufficient documentation

## 2017-06-13 DIAGNOSIS — E6609 Other obesity due to excess calories: Secondary | ICD-10-CM | POA: Diagnosis present

## 2017-10-29 ENCOUNTER — Encounter (INDEPENDENT_AMBULATORY_CARE_PROVIDER_SITE_OTHER): Payer: Self-pay | Admitting: *Deleted

## 2017-10-29 ENCOUNTER — Ambulatory Visit (INDEPENDENT_AMBULATORY_CARE_PROVIDER_SITE_OTHER): Payer: Medicare Other | Admitting: Internal Medicine

## 2017-10-29 ENCOUNTER — Encounter (INDEPENDENT_AMBULATORY_CARE_PROVIDER_SITE_OTHER): Payer: Self-pay | Admitting: Internal Medicine

## 2017-10-29 VITALS — BP 190/80 | HR 64 | Temp 97.9°F | Ht 61.0 in | Wt 169.6 lb

## 2017-10-29 DIAGNOSIS — R1013 Epigastric pain: Secondary | ICD-10-CM

## 2017-10-29 DIAGNOSIS — I10 Essential (primary) hypertension: Secondary | ICD-10-CM

## 2017-10-29 HISTORY — DX: Essential (primary) hypertension: I10

## 2017-10-29 NOTE — Progress Notes (Signed)
   Subjective:    Patient ID: Patricia Sharp, female    DOB: Nov 18, 1950, 67 y.o.   MRN: [id number]  HPI Referred by Dr. Hilma Favors for epigastric pain.  A couple of weeks ago she felt bad. She says her knee and leg were swollen. She was given a steroid injection. She says she was also treated for a UTI. She also had tightness and bloating in her abdomen. Bloating and tightness lasted for about 2 weeks. She also had a feeling in her chest (burning). She says today her symptoms are better. She says the Protonix is working. She says she had an EKG which was normal. She also had an US abdomen 10/17/2017 which was normal.  She had been taking Nabumetone  500mg  BID for about 2 weeks. She says she feels 50% better.  Her last colonoscopy was in November of 2014 (average risk). Examination performed to cecum. Two small polyps removed from the sigmoid colon; one was cold snared and other one removed using biopsy forceps. Mild sigmoid colon diverticulosis. Small external hemorrhoids and anal papillae. Biopsy: benign polypoid colonic mucosa. Appetite is good. No weight loss. BMs are normal. No change in her stools.  10/10/2017 Albumin 4.5, toal bili 0.4, ALP 92, AST 12, ALT 16.   Hx of hypertension.   Review of Systems     Objective:   Physical Exam Blood pressure (!) 190/80, pulse 64, temperature 97.9 F (36.6 C), height 5\' 1"  (1.549 m), weight 169 lb 9.6 oz (76.9 kg). Alert and oriented. Skin warm and dry. Oral mucosa is moist.   . Sclera anicteric, conjunctivae is pink. Thyroid not enlarged. No cervical lymphadenopathy. Lungs clear. Heart regular rate and rhythm.  Abdomen is soft. Bowel sounds are positive. No hepatomegaly. No abdominal masses felt. No tenderness.  No edema to lower extremities.           Assessment & Plan:  Epigastric pain: GERD needs to be ruled out.  The risks of bleeding, perforation and infection were reviewed with patient.

## 2017-10-29 NOTE — Patient Instructions (Signed)
The risks of bleeding, perforation and infection were reviewed with patient.  

## 2017-11-04 ENCOUNTER — Ambulatory Visit (INDEPENDENT_AMBULATORY_CARE_PROVIDER_SITE_OTHER): Payer: Medicare Other | Admitting: Internal Medicine

## 2017-11-25 ENCOUNTER — Encounter (HOSPITAL_COMMUNITY): Payer: Self-pay | Admitting: *Deleted

## 2017-11-25 ENCOUNTER — Other Ambulatory Visit: Payer: Self-pay

## 2017-11-25 ENCOUNTER — Ambulatory Visit (HOSPITAL_COMMUNITY)
Admission: RE | Admit: 2017-11-25 | Discharge: 2017-11-25 | Disposition: A | Discharge status: Home / Self Care | Payer: Medicare Other | Source: Ambulatory Visit | Attending: Internal Medicine | Admitting: Internal Medicine

## 2017-11-25 ENCOUNTER — Encounter (HOSPITAL_COMMUNITY)
Admission: RE | Disposition: A | Discharge status: Home / Self Care | Payer: Self-pay | Source: Ambulatory Visit | Attending: Internal Medicine

## 2017-11-25 DIAGNOSIS — E119 Type 2 diabetes mellitus without complications: Secondary | ICD-10-CM | POA: Diagnosis not present

## 2017-11-25 DIAGNOSIS — R1013 Epigastric pain: Secondary | ICD-10-CM | POA: Insufficient documentation

## 2017-11-25 DIAGNOSIS — Z7982 Long term (current) use of aspirin: Secondary | ICD-10-CM | POA: Diagnosis not present

## 2017-11-25 DIAGNOSIS — K2950 Unspecified chronic gastritis without bleeding: Secondary | ICD-10-CM | POA: Insufficient documentation

## 2017-11-25 DIAGNOSIS — F419 Anxiety disorder, unspecified: Secondary | ICD-10-CM | POA: Insufficient documentation

## 2017-11-25 DIAGNOSIS — I10 Essential (primary) hypertension: Secondary | ICD-10-CM | POA: Insufficient documentation

## 2017-11-25 DIAGNOSIS — K228 Other specified diseases of esophagus: Secondary | ICD-10-CM | POA: Insufficient documentation

## 2017-11-25 DIAGNOSIS — Z79899 Other long term (current) drug therapy: Secondary | ICD-10-CM | POA: Insufficient documentation

## 2017-11-25 DIAGNOSIS — K2970 Gastritis, unspecified, without bleeding: Secondary | ICD-10-CM | POA: Diagnosis not present

## 2017-11-25 DIAGNOSIS — Z881 Allergy status to other antibiotic agents status: Secondary | ICD-10-CM | POA: Diagnosis not present

## 2017-11-25 DIAGNOSIS — Z7984 Long term (current) use of oral hypoglycemic drugs: Secondary | ICD-10-CM | POA: Insufficient documentation

## 2017-11-25 HISTORY — DX: Type 2 diabetes mellitus without complications: E11.9

## 2017-11-25 HISTORY — PX: ESOPHAGOGASTRODUODENOSCOPY: SHX5428

## 2017-11-25 HISTORY — PX: BIOPSY: SHX5522

## 2017-11-25 SURGERY — EGD (ESOPHAGOGASTRODUODENOSCOPY)
Anesthesia: Moderate Sedation

## 2017-11-25 MED ORDER — MEPERIDINE HCL 50 MG/ML IJ SOLN
INTRAMUSCULAR | Status: DC | PRN
  Administered 2017-11-25 (×2): 25 mg

## 2017-11-25 MED ORDER — SODIUM CHLORIDE 0.9 % IV SOLN
INTRAVENOUS | Status: DC
  Administered 2017-11-25: 10:00:00 via INTRAVENOUS

## 2017-11-25 MED ORDER — LIDOCAINE VISCOUS HCL 2 % MT SOLN
OROMUCOSAL | Status: DC | PRN
  Administered 2017-11-25: 1 via OROMUCOSAL

## 2017-11-25 MED ORDER — STERILE WATER FOR IRRIGATION IR SOLN
Status: DC | PRN
  Administered 2017-11-25: 1.5 mL

## 2017-11-25 MED ORDER — PANTOPRAZOLE SODIUM 40 MG PO TBEC: 40.0000 mg | DELAYED_RELEASE_TABLET | Freq: Every day | ORAL | 2 refills | Status: DC

## 2017-11-25 MED ORDER — MIDAZOLAM HCL 5 MG/5ML IJ SOLN
INTRAMUSCULAR | Status: AC
  Filled 2017-11-25: qty 10

## 2017-11-25 MED ORDER — MIDAZOLAM HCL 5 MG/5ML IJ SOLN
INTRAMUSCULAR | Status: DC | PRN
  Administered 2017-11-25 (×4): 2 mg via INTRAVENOUS
  Administered 2017-11-25: 1 mg via INTRAVENOUS

## 2017-11-25 MED ORDER — LIDOCAINE VISCOUS HCL 2 % MT SOLN
OROMUCOSAL | Status: AC
  Filled 2017-11-25: qty 15

## 2017-11-25 MED ORDER — MEPERIDINE HCL 50 MG/ML IJ SOLN
INTRAMUSCULAR | Status: AC
  Filled 2017-11-25: qty 1

## 2017-11-25 NOTE — Discharge Instructions (Signed)
Resume aspirin on 11/26/2017. Pantoprazole 40 mg by mouth 30 minutes before breakfast daily. Resume other medications and diet as before. No driving for 24 hours. Physician will call with biopsy results.  Esophagogastroduodenoscopy, Care After Refer to this sheet in the next few weeks. These instructions provide you with information about caring for yourself after your procedure. Your health care provider may also give you more specific instructions. Your treatment has been planned according to current medical practices, but problems sometimes occur. Call your health care provider if you have any problems or questions after your procedure. Dr Laural Golden:  [phone]; after hours and weekends call the hospital and have the GI doctor on call paged.  They will call you back. What can I expect after the procedure? After the procedure, it is common to have:  A sore throat.  Bloating..  Fatigue.  Follow these instructions at home:  Do not eat or drink anything until the numbing medicine (local anesthetic) has worn off and your gag reflex has returned. You will know that the local anesthetic has worn off when you can swallow comfortably.  Do not drive for 24 hours if you received a medicine to help you relax (sedative).  If your health care provider took a tissue sample for testing during the procedure, make sure to get your test results. This is your responsibility. Ask your health care provider or the department performing the test when your results will be ready.  Keep all follow-up visits as told by your health care provider. This is important. Contact a health care provider if:  You cannot stop coughing.  You are not urinating.  You are urinating less than usual. Get help right away if:  You have trouble swallowing.  You cannot eat or drink.  You have throat or chest pain that gets worse.  You have nausea or vomiting.  You have a fever.  You have severe abdominal pain.  You  have black, tarry, or bloody stools. This information is not intended to replace advice given to you by your health care provider. Make sure you discuss any questions you have with your health care provider. Document Released: 12/19/2011 Document Revised: 06/09/2015 Document Reviewed: 11/25/2014 Elsevier Interactive Patient Education  [employer].

## 2017-11-25 NOTE — Progress Notes (Signed)
Blood glucose 140

## 2017-11-25 NOTE — H&P (Signed)
Patricia Sharp is an 67 y.o. female.   Chief Complaint: Patient is here for EGD. HPI: Patient is 67 year old Caucasian female who presents with few weeks history of epigastric pain as well as retrosternal pain which she is not sure if it has heartburn or otherwise.  She took pantoprazole and noted 50% improvement.  She denies nausea vomiting melena or rectal bleeding.  She she had an EKG by Dr. Hilma Favors and was within normal limits.  She has an appointment to see her cardiologist.  Pain is not associated with dyspnea and usually occurs after meals. She also had ultrasound on 10/17/2017 which revealed 10 mm hemangioma fatty liver but no evidence of dilated bile duct. She is status post cholecystectomy. She is on low-dose aspirin and she may take Advil occasionally.  Past Medical History:  Diagnosis Date  . Anxiety   . Depression   . Diabetes mellitus without complication ([place])   . Essential hypertension, benign 10/29/2017  . Hypertension     Past Surgical History:  Procedure Laterality Date  . ABDOMINAL HYSTERECTOMY    . ABDOMINAL SURGERY     to remove fibroid tumors  . COLONOSCOPY N/A 01/02/2013   Procedure: COLONOSCOPY;  Surgeon: Rogene Houston, MD;  [facility];  Service: Endoscopy;  Laterality: N/A;  825  . REFRACTIVE SURGERY Bilateral     Family History  Problem Relation Age of Onset  . Colon cancer Neg Hx    Social History:  reports that she has never smoked. She has never used smokeless tobacco. She reports that she does not drink alcohol or use drugs.  Allergies:  Allergies  Allergen Reactions  . Azithromycin Rash  . Cefuroxime Axetil Rash    Medications Prior to Admission  Medication Sig Dispense Refill  . ALPRAZolam (XANAX) 0.5 MG tablet Take 0.25-0.5 mg by mouth at bedtime as needed for anxiety or sleep.     . Carboxymethylcellulose Sodium (THERATEARS OP) Place 1 drop into both eyes daily.    . Cholecalciferol (VITAMIN D3) 1000 units CAPS Take 1,000  Units by mouth daily.     Marland Kitchen diltiazem (TIAZAC) 360 MG 24 hr capsule Take 360 mg by mouth daily.    . furosemide (LASIX) 40 MG tablet Take 40 mg by mouth daily as needed for fluid.     . hydrochlorothiazide (HYDRODIURIL) 25 MG tablet Take 25 mg by mouth daily.    . metFORMIN (GLUCOPHAGE) 500 MG tablet Take 250 mg by mouth 2 (two) times daily with a meal.    . Omega-3 Fatty Acids (FISH OIL) 1000 MG CAPS Take 1,000 mg by mouth daily.    . pantoprazole (PROTONIX) 40 MG tablet Take 40 mg by mouth daily as needed (heartburn).     . quinapril (ACCUPRIL) 40 MG tablet Take 40 mg by mouth daily.     Marland Kitchen aspirin EC 81 MG tablet Take 81 mg by mouth daily.      No results found for this or any previous visit (from the past 48 hour(s)). No results found.  ROS  Blood pressure (!) 169/59, pulse 66, temperature 97.8 F (36.6 C), temperature source Oral, resp. rate 19, height 5\' 1"  (1.549 m), weight 76.9 kg, SpO2 96 %. Physical Exam  Constitutional: She appears well-developed and well-nourished.  HENT:  Mouth/Throat: Oropharynx is clear and moist.  Eyes: Conjunctivae are normal. No scleral icterus.  Neck: No thyromegaly present.  Cardiovascular: Normal rate, regular rhythm and normal heart sounds.  No murmur heard. Respiratory: Effort  normal and breath sounds normal.  GI:  Abdomen is full with long midline scar.  It is soft and nontender with organomegaly or masses.  Musculoskeletal: She exhibits edema.  Trace edema to right leg and 1+ edema to left leg. No calf tenderness noted.  Lymphadenopathy:    She has no cervical adenopathy.  Neurological: She is alert.  Skin: Skin is warm and dry.     Assessment/Plan Epigastric pain as well as atypical chest pain. Diagnostic EGD.  Hildred Laser, MD 11/25/2017, 10:14 AM

## 2017-11-25 NOTE — Op Note (Signed)
[facility] Patient Name: Gwendy Boeder Procedure Date: 11/25/2017 10:02 AM MRN: [id number] Date of Birth: 1950-08-22 Attending MD: Hildred Laser , MD CSN: [id number] Age: 67 Admit Type: Outpatient Procedure:                Upper GI endoscopy Indications:              Epigastric abdominal pain Providers:                Hildred Laser, MD, Lurline Del, RN Referring MD:             Halford Chessman, MD Medicines:                Lidocaine spray, Meperidine 50 mg IV, Midazolam 9                            mg IV Complications:            No immediate complications. Estimated Blood Loss:     Estimated blood loss was minimal. Procedure:                Pre-Anesthesia Assessment:                           - Prior to the procedure, a History and Physical                            was performed, and patient medications and                            allergies were reviewed. The patient's tolerance of                            previous anesthesia was also reviewed. The risks                            and benefits of the procedure and the sedation                            options and risks were discussed with the patient.                            All questions were answered, and informed consent                            was obtained. Prior Anticoagulants: The patient                            last took aspirin 2 days prior to the procedure.                            ASA Grade Assessment: II - A patient with mild                            systemic disease. After reviewing the risks and  benefits, the patient was deemed in satisfactory                            condition to undergo the procedure.                           After obtaining informed consent, the endoscope was                            passed under direct vision. Throughout the                            procedure, the patient's blood pressure, pulse, and                            oxygen  saturations were monitored continuously. The                            GIF-H190 ([id number]) scope was introduced through the                            mouth, and advanced to the second part of duodenum.                            The upper GI endoscopy was accomplished without                            difficulty. The patient tolerated the procedure                            well. Scope In: 10:28:26 AM Scope Out: 10:36:14 AM Total Procedure Duration: 0 hours 7 minutes 48 seconds  Findings:      The examined esophagus was normal.      The Z-line was irregular and was found 36 cm from the incisors.      Patchy inflammation characterized by congestion (edema), erosions and       erythema was found in the gastric body. Biopsies were taken with a cold       forceps for histology.      The exam of the stomach was otherwise normal.      The duodenal bulb and second portion of the duodenum were normal. Impression:               - Normal esophagus.                           - Z-line irregular, 36 cm from the incisors.                           - Gastritis at body. Biopsied.                           - Normal duodenal bulb and second portion of the                            duodenum.  Moderate Sedation:      Moderate (conscious) sedation was administered by the endoscopy nurse       and supervised by the endoscopist. The following parameters were       monitored: oxygen saturation, heart rate, blood pressure, CO2       capnography and response to care. Total physician intraservice time was       16 minutes. Recommendation:           - Patient has a contact number available for                            emergencies. The signs and symptoms of potential                            delayed complications were discussed with the                            patient. Return to normal activities tomorrow.                            Written discharge instructions were provided to the                             patient.                           - Resume previous diet today.                           - Continue present medications.                           - Take Pantoprazole daily instead of on as needed                            basis.                           - No aspirin, ibuprofen, naproxen, or other                            non-steroidal anti-inflammatory drugs for 1 day.                           - Await pathology results. Procedure Code(s):        --- Professional ---                           [phone], Esophagogastroduodenoscopy, flexible,                            transoral; with biopsy, single or multiple                           G0500, Moderate sedation services provided by the  same physician or other qualified health care                            professional performing a gastrointestinal                            endoscopic service that sedation supports,                            requiring the presence of an independent trained                            observer to assist in the monitoring of the                            patient's level of consciousness and physiological                            status; initial 15 minutes of intra-service time;                            patient age 27 years or older (additional time may                            be reported with [phone], as appropriate) Diagnosis Code(s):        --- Professional ---                           K22.8, Other specified diseases of esophagus                           K29.70, Gastritis, unspecified, without bleeding                           R10.13, Epigastric pain CPT copyright 2018 American Medical Association. All rights reserved. The codes documented in this report are preliminary and upon coder review may  be revised to meet current compliance requirements. Hildred Laser, MD Hildred Laser, MD 11/25/2017 10:49:23 AM This report has been signed electronically. Number  of Addenda: 0

## 2017-11-28 LAB — GLUCOSE, CAPILLARY: GLUCOSE-CAPILLARY: 140 mg/dL — AB (ref 70–99)

## 2017-11-29 ENCOUNTER — Encounter (HOSPITAL_COMMUNITY): Payer: Self-pay | Admitting: Internal Medicine

## 2017-12-03 NOTE — Progress Notes (Signed)
Cardiology Office Note   Date:  12/06/2017   ID:  Patricia Sharp, Patricia Sharp 1950-09-30, MRN [id number]  PCP:  Sharilyn Sites, MD  Cardiologist:   Jenkins Rouge, MD   No chief complaint on file.     History of Present Illness: Patricia Sharp is a 67 y.o. female who presents for consultation regarding chest pain Referred by Dr Hilma Favors CRF;s include DM and HTN. Significant GERD Seen by GI Dr Laural Golden with 50% improvement in epigastric / retrosternal pain with Protonix.   EGD 11/25/17 with gastritis normal esophagus.  No history of CAD or vascular disease Has not been evaluated by Cardiology Before  Most of her symptoms in am after taking diuretic. Feels something funny in her chest Not usually exertional  She seems a bit anxious about her health. Has exertional dyspnea    Past Medical History:  Diagnosis Date  . Anxiety   . Depression   . Diabetes mellitus without complication ([place])   . Essential hypertension, benign 10/29/2017  . Hypertension     Past Surgical History:  Procedure Laterality Date  . ABDOMINAL HYSTERECTOMY    . ABDOMINAL SURGERY     to remove fibroid tumors  . BIOPSY  11/25/2017   Procedure: BIOPSY;  Surgeon: Rogene Houston, MD;  [facility];  Service: Endoscopy;;  gastric  . COLONOSCOPY N/A 01/02/2013   Procedure: COLONOSCOPY;  Surgeon: Rogene Houston, MD;  [facility];  Service: Endoscopy;  Laterality: N/A;  825  . ESOPHAGOGASTRODUODENOSCOPY N/A 11/25/2017   Procedure: ESOPHAGOGASTRODUODENOSCOPY (EGD);  Surgeon: Rogene Houston, MD;  [facility];  Service: Endoscopy;  Laterality: N/A;  12:00-moved to 11/11 @ 9:55am per Lelon Frohlich  . REFRACTIVE SURGERY Bilateral      Current Outpatient Medications  Medication Sig Dispense Refill  . ALPRAZolam (XANAX) 0.5 MG tablet Take 0.25-0.5 mg by mouth at bedtime as needed for anxiety or sleep.     Marland Kitchen aspirin EC 81 MG tablet Take 1 tablet (81 mg total) by mouth daily.    .  Carboxymethylcellulose Sodium (THERATEARS OP) Place 1 drop into both eyes daily.    . Cholecalciferol (VITAMIN D3) 1000 units CAPS Take 1,000 Units by mouth daily.     Marland Kitchen diltiazem (TIAZAC) 360 MG 24 hr capsule Take 360 mg by mouth daily.    . furosemide (LASIX) 40 MG tablet Take 40 mg by mouth daily as needed for fluid.     . hydrochlorothiazide (HYDRODIURIL) 25 MG tablet Take 25 mg by mouth daily.    . metFORMIN (GLUCOPHAGE) 500 MG tablet Take 250 mg by mouth 2 (two) times daily with a meal.    . Omega-3 Fatty Acids (FISH OIL) 1000 MG CAPS Take 1,000 mg by mouth daily.    . pantoprazole (PROTONIX) 40 MG tablet Take 1 tablet (40 mg total) by mouth daily before breakfast. 30 tablet 2  . quinapril (ACCUPRIL) 40 MG tablet Take 40 mg by mouth daily.      No current facility-administered medications for this visit.     Allergies:   Azithromycin and Cefuroxime axetil    Social History:  The patient  reports that she has never smoked. She has never used smokeless tobacco. She reports that she does not drink alcohol or use drugs.   Family History:  The patient's family history is not on file.    ROS:  Please see the history of present illness.   Otherwise, review of systems are positive  for none.   All other systems are reviewed and negative.    PHYSICAL EXAM: VS:  BP (!) 146/60   Pulse 79   Ht 5\' 1"  (1.549 m)   Wt 173 lb (78.5 kg)   SpO2 93%   BMI 32.69 kg/m  , BMI Body mass index is 32.69 kg/m. Affect appropriate Healthy:  appears stated age 27: normal Neck supple with no adenopathy JVP normal no bruits no thyromegaly Lungs clear with no wheezing and good diaphragmatic motion Heart:  S1/S2 no murmur, no rub, gallop or click PMI normal Abdomen: benighn, BS positve, no tenderness, no AAA no bruit.  No HSM or HJR Distal pulses intact with no bruits No edema Neuro non-focal Skin warm and dry No muscular weakness    EKG:  NSR nonspecific ST changes    Recent Labs: No  results found for requested labs within last 8760 hours.    Lipid Panel No results found for: CHOL, TRIG, HDL, CHOLHDL, VLDL, LDLCALC, LDLDIRECT    Wt Readings from Last 3 Encounters:  12/06/17 173 lb (78.5 kg)  11/25/17 169 lb 8.5 oz (76.9 kg)  10/29/17 169 lb 9.6 oz (76.9 kg)      Other studies Reviewed: Additional studies/ records that were reviewed today include: Notes from primary Dr Hilma Favors , ECG labs Notes from GI Dr Laural Golden With EGD .    ASSESSMENT AND PLAN:  1.  Chest Pain:  Atypical but persistent with no GI source found on EGD Given risk factors including DM will order lexiscan myovue  2.  GERD:  Continue protonix biopsy results from EGD pending 3. HTN:  Well controlled.  Continue current medications and low sodium Dash type diet.   4.  DM:  Discussed low carb diet.  Target hemoglobin A1c is 6.5 or less.  Continue current medications. 5. Dyspnea:  Functional normal exam f/u TTE to assess RV/LV function    Current medicines are reviewed at length with the patient today.  The patient does not have concerns regarding medicines.  The following changes have been made:  no change  Labs/ tests ordered today include: Lex Myovue and TTE  No orders of the defined types were placed in this encounter.    Disposition:   FU with cardiology PRN if stress testing normal      Signed, Jenkins Rouge, MD  12/06/2017 10:54 AM    [facility] [place], [place],   [address] Phone: [phone]; Fax: [phone]

## 2017-12-06 ENCOUNTER — Encounter: Payer: Self-pay | Admitting: Cardiovascular Disease

## 2017-12-06 ENCOUNTER — Ambulatory Visit: Payer: Medicare Other | Admitting: Cardiovascular Disease

## 2017-12-06 VITALS — BP 146/60 | HR 79 | Ht 61.0 in | Wt 173.0 lb

## 2017-12-06 DIAGNOSIS — R079 Chest pain, unspecified: Secondary | ICD-10-CM

## 2017-12-06 DIAGNOSIS — R0600 Dyspnea, unspecified: Secondary | ICD-10-CM | POA: Diagnosis not present

## 2017-12-06 NOTE — Patient Instructions (Signed)
Medication Instructions:  Your physician recommends that you continue on your current medications as directed. Please refer to the Current Medication list given to you today.  If you need a refill on your cardiac medications before your next appointment, please call your pharmacy.   Lab work: none If you have labs (blood work) drawn today and your tests are completely normal, you will receive your results only by: Marland Kitchen MyChart Message (if you have MyChart) OR . A paper copy in the mail If you have any lab test that is abnormal or we need to change your treatment, we will call you to review the results.  Testing/Procedures: Your physician has requested that you have an echocardiogram. Echocardiography is a painless test that uses sound waves to create images of your heart. It provides your doctor with information about the size and shape of your heart and how well your heart's chambers and valves are working. This procedure takes approximately one hour. There are no restrictions for this procedure.  Your physician has requested that you have a lexiscan myoview. For further information please visit [email or web address]. Please follow instruction sheet, as given.    Follow-Up: as needed with Dr.Nishan

## 2017-12-16 ENCOUNTER — Ambulatory Visit (HOSPITAL_COMMUNITY)
Admission: RE | Admit: 2017-12-16 | Discharge: 2017-12-16 | Disposition: A | Discharge status: Home / Self Care | Payer: Medicare Other | Source: Ambulatory Visit | Attending: Cardiovascular Disease | Admitting: Cardiovascular Disease

## 2017-12-16 ENCOUNTER — Telehealth: Payer: Self-pay | Admitting: Cardiovascular Disease

## 2017-12-16 ENCOUNTER — Encounter (HOSPITAL_COMMUNITY)
Admission: RE | Admit: 2017-12-16 | Discharge: 2017-12-16 | Disposition: A | Discharge status: Home / Self Care | Payer: Medicare Other | Source: Ambulatory Visit | Attending: Cardiovascular Disease | Admitting: Cardiovascular Disease

## 2017-12-16 ENCOUNTER — Encounter (HOSPITAL_BASED_OUTPATIENT_CLINIC_OR_DEPARTMENT_OTHER)
Admission: RE | Admit: 2017-12-16 | Discharge: 2017-12-16 | Disposition: A | Discharge status: Home / Self Care | Payer: Medicare Other | Source: Ambulatory Visit | Attending: Cardiovascular Disease | Admitting: Cardiovascular Disease

## 2017-12-16 DIAGNOSIS — R079 Chest pain, unspecified: Secondary | ICD-10-CM | POA: Diagnosis present

## 2017-12-16 DIAGNOSIS — R0600 Dyspnea, unspecified: Secondary | ICD-10-CM | POA: Diagnosis present

## 2017-12-16 LAB — NM MYOCAR MULTI W/SPECT W/WALL MOTION / EF
CHL CUP NUCLEAR SDS: 3
CHL CUP NUCLEAR SRS: 0
CSEPPHR: 86 {beats}/min
LV dias vol: 78 mL (ref 46–106)
LV sys vol: 24 mL
RATE: 0.28
Rest HR: 62 {beats}/min
SSS: 3
TID: 1.04

## 2017-12-16 MED ORDER — REGADENOSON 0.4 MG/5ML IV SOLN
INTRAVENOUS | Status: AC
  Administered 2017-12-16: 0.4 mg via INTRAVENOUS
  Filled 2017-12-16: qty 5

## 2017-12-16 MED ORDER — TECHNETIUM TC 99M TETROFOSMIN IV KIT
10.0000 | PACK | Freq: Once | INTRAVENOUS | Status: AC | PRN
  Administered 2017-12-16: 10.22 via INTRAVENOUS

## 2017-12-16 MED ORDER — TECHNETIUM TC 99M TETROFOSMIN IV KIT
30.0000 | PACK | Freq: Once | INTRAVENOUS | Status: AC | PRN
  Administered 2017-12-16: 30 via INTRAVENOUS

## 2017-12-16 MED ORDER — SODIUM CHLORIDE 0.9% FLUSH
INTRAVENOUS | Status: AC
  Administered 2017-12-16: 10 mL via INTRAVENOUS
  Filled 2017-12-16: qty 10

## 2017-12-16 NOTE — Telephone Encounter (Signed)
Pt rtn call re Echo, told EF normal, if anything else she needs to know pls call  [phone] otherwise will wait until she is called for stress test results

## 2017-12-16 NOTE — Progress Notes (Signed)
*  PRELIMINARY RESULTS* Echocardiogram 2D Echocardiogram has been performed.  Leavy Cella 12/16/2017, 9:40 AM

## 2017-12-16 NOTE — Telephone Encounter (Signed)
Patient aware of echo and stress test results.

## 2018-06-19 ENCOUNTER — Other Ambulatory Visit (HOSPITAL_COMMUNITY): Payer: Self-pay | Admitting: Family Medicine

## 2018-06-19 DIAGNOSIS — Z1231 Encounter for screening mammogram for malignant neoplasm of breast: Secondary | ICD-10-CM

## 2018-06-25 ENCOUNTER — Ambulatory Visit (HOSPITAL_COMMUNITY)
Admission: RE | Admit: 2018-06-25 | Discharge: 2018-06-25 | Disposition: A | Discharge status: Home / Self Care | Payer: Medicare Other | Source: Ambulatory Visit | Attending: Family Medicine | Admitting: Family Medicine

## 2018-06-25 ENCOUNTER — Other Ambulatory Visit: Payer: Self-pay

## 2018-06-25 DIAGNOSIS — Z1231 Encounter for screening mammogram for malignant neoplasm of breast: Secondary | ICD-10-CM | POA: Diagnosis present

## 2019-02-12 DIAGNOSIS — H401131 Primary open-angle glaucoma, bilateral, mild stage: Secondary | ICD-10-CM | POA: Diagnosis not present

## 2019-02-12 DIAGNOSIS — H2513 Age-related nuclear cataract, bilateral: Secondary | ICD-10-CM | POA: Diagnosis not present

## 2019-04-08 DIAGNOSIS — M25562 Pain in left knee: Secondary | ICD-10-CM | POA: Diagnosis not present

## 2019-05-14 DIAGNOSIS — H2513 Age-related nuclear cataract, bilateral: Secondary | ICD-10-CM | POA: Diagnosis not present

## 2019-05-14 DIAGNOSIS — H401131 Primary open-angle glaucoma, bilateral, mild stage: Secondary | ICD-10-CM | POA: Diagnosis not present

## 2019-05-19 DIAGNOSIS — Z0000 Encounter for general adult medical examination without abnormal findings: Secondary | ICD-10-CM | POA: Diagnosis not present

## 2019-05-19 DIAGNOSIS — E119 Type 2 diabetes mellitus without complications: Secondary | ICD-10-CM | POA: Diagnosis not present

## 2019-05-19 DIAGNOSIS — I10 Essential (primary) hypertension: Secondary | ICD-10-CM | POA: Diagnosis not present

## 2019-05-19 DIAGNOSIS — Z6834 Body mass index (BMI) 34.0-34.9, adult: Secondary | ICD-10-CM | POA: Diagnosis not present

## 2019-05-19 DIAGNOSIS — M2342 Loose body in knee, left knee: Secondary | ICD-10-CM | POA: Diagnosis not present

## 2019-05-19 DIAGNOSIS — Z1389 Encounter for screening for other disorder: Secondary | ICD-10-CM | POA: Diagnosis not present

## 2019-05-19 DIAGNOSIS — F419 Anxiety disorder, unspecified: Secondary | ICD-10-CM | POA: Diagnosis not present

## 2019-07-08 DIAGNOSIS — M1712 Unilateral primary osteoarthritis, left knee: Secondary | ICD-10-CM | POA: Diagnosis not present

## 2019-08-13 DIAGNOSIS — H2513 Age-related nuclear cataract, bilateral: Secondary | ICD-10-CM | POA: Diagnosis not present

## 2019-08-13 DIAGNOSIS — H401131 Primary open-angle glaucoma, bilateral, mild stage: Secondary | ICD-10-CM | POA: Diagnosis not present

## 2019-08-24 DIAGNOSIS — E6609 Other obesity due to excess calories: Secondary | ICD-10-CM | POA: Diagnosis not present

## 2019-08-24 DIAGNOSIS — Z6834 Body mass index (BMI) 34.0-34.9, adult: Secondary | ICD-10-CM | POA: Diagnosis not present

## 2019-08-24 DIAGNOSIS — M2342 Loose body in knee, left knee: Secondary | ICD-10-CM | POA: Diagnosis not present

## 2019-08-24 DIAGNOSIS — D509 Iron deficiency anemia, unspecified: Secondary | ICD-10-CM | POA: Diagnosis not present

## 2019-08-24 DIAGNOSIS — M1712 Unilateral primary osteoarthritis, left knee: Secondary | ICD-10-CM | POA: Diagnosis not present

## 2019-08-24 DIAGNOSIS — E7849 Other hyperlipidemia: Secondary | ICD-10-CM | POA: Diagnosis not present

## 2019-08-24 DIAGNOSIS — E559 Vitamin D deficiency, unspecified: Secondary | ICD-10-CM | POA: Diagnosis not present

## 2019-08-24 DIAGNOSIS — Z01810 Encounter for preprocedural cardiovascular examination: Secondary | ICD-10-CM | POA: Diagnosis not present

## 2019-08-24 DIAGNOSIS — E119 Type 2 diabetes mellitus without complications: Secondary | ICD-10-CM | POA: Diagnosis not present

## 2019-08-25 ENCOUNTER — Other Ambulatory Visit (HOSPITAL_COMMUNITY): Payer: Self-pay | Admitting: Family Medicine

## 2019-08-25 DIAGNOSIS — Z1231 Encounter for screening mammogram for malignant neoplasm of breast: Secondary | ICD-10-CM

## 2019-09-01 ENCOUNTER — Ambulatory Visit (HOSPITAL_COMMUNITY)
Admission: RE | Admit: 2019-09-01 | Discharge: 2019-09-01 | Disposition: A | Discharge status: Home / Self Care | Payer: Medicare PPO | Source: Ambulatory Visit | Attending: Family Medicine | Admitting: Family Medicine

## 2019-09-01 ENCOUNTER — Other Ambulatory Visit: Payer: Self-pay

## 2019-09-01 DIAGNOSIS — Z1231 Encounter for screening mammogram for malignant neoplasm of breast: Secondary | ICD-10-CM | POA: Diagnosis not present

## 2019-09-08 ENCOUNTER — Other Ambulatory Visit (HOSPITAL_COMMUNITY): Payer: Self-pay | Admitting: Physician Assistant

## 2019-09-08 DIAGNOSIS — R928 Other abnormal and inconclusive findings on diagnostic imaging of breast: Secondary | ICD-10-CM

## 2019-09-15 ENCOUNTER — Ambulatory Visit (HOSPITAL_COMMUNITY)
Admission: RE | Admit: 2019-09-15 | Discharge: 2019-09-15 | Disposition: A | Discharge status: Home / Self Care | Payer: Medicare PPO | Source: Ambulatory Visit | Attending: Physician Assistant | Admitting: Physician Assistant

## 2019-09-15 ENCOUNTER — Other Ambulatory Visit: Payer: Self-pay

## 2019-09-15 DIAGNOSIS — R928 Other abnormal and inconclusive findings on diagnostic imaging of breast: Secondary | ICD-10-CM | POA: Insufficient documentation

## 2019-10-07 DIAGNOSIS — M1712 Unilateral primary osteoarthritis, left knee: Secondary | ICD-10-CM | POA: Diagnosis not present

## 2019-10-14 DIAGNOSIS — M1712 Unilateral primary osteoarthritis, left knee: Secondary | ICD-10-CM | POA: Diagnosis not present

## 2019-10-21 DIAGNOSIS — M1712 Unilateral primary osteoarthritis, left knee: Secondary | ICD-10-CM | POA: Diagnosis not present

## 2019-11-17 DIAGNOSIS — H2513 Age-related nuclear cataract, bilateral: Secondary | ICD-10-CM | POA: Diagnosis not present

## 2019-11-17 DIAGNOSIS — H401131 Primary open-angle glaucoma, bilateral, mild stage: Secondary | ICD-10-CM | POA: Diagnosis not present

## 2020-02-08 DIAGNOSIS — M1712 Unilateral primary osteoarthritis, left knee: Secondary | ICD-10-CM | POA: Diagnosis not present

## 2020-03-02 DIAGNOSIS — S8002XA Contusion of left knee, initial encounter: Secondary | ICD-10-CM | POA: Diagnosis not present

## 2020-03-02 DIAGNOSIS — M1712 Unilateral primary osteoarthritis, left knee: Secondary | ICD-10-CM | POA: Diagnosis not present

## 2020-03-23 DIAGNOSIS — H401131 Primary open-angle glaucoma, bilateral, mild stage: Secondary | ICD-10-CM | POA: Diagnosis not present

## 2020-03-23 DIAGNOSIS — H2513 Age-related nuclear cataract, bilateral: Secondary | ICD-10-CM | POA: Diagnosis not present

## 2020-04-13 DIAGNOSIS — F419 Anxiety disorder, unspecified: Secondary | ICD-10-CM | POA: Diagnosis not present

## 2020-04-13 DIAGNOSIS — E7849 Other hyperlipidemia: Secondary | ICD-10-CM | POA: Diagnosis not present

## 2020-04-13 DIAGNOSIS — I10 Essential (primary) hypertension: Secondary | ICD-10-CM | POA: Diagnosis not present

## 2020-04-13 DIAGNOSIS — Z6833 Body mass index (BMI) 33.0-33.9, adult: Secondary | ICD-10-CM | POA: Diagnosis not present

## 2020-04-13 DIAGNOSIS — E6609 Other obesity due to excess calories: Secondary | ICD-10-CM | POA: Diagnosis not present

## 2020-04-13 DIAGNOSIS — M1712 Unilateral primary osteoarthritis, left knee: Secondary | ICD-10-CM | POA: Diagnosis not present

## 2020-04-13 DIAGNOSIS — Z7901 Long term (current) use of anticoagulants: Secondary | ICD-10-CM | POA: Diagnosis not present

## 2020-04-13 DIAGNOSIS — Z7984 Long term (current) use of oral hypoglycemic drugs: Secondary | ICD-10-CM | POA: Diagnosis not present

## 2020-04-13 DIAGNOSIS — Z01810 Encounter for preprocedural cardiovascular examination: Secondary | ICD-10-CM | POA: Diagnosis not present

## 2020-04-13 DIAGNOSIS — Z1389 Encounter for screening for other disorder: Secondary | ICD-10-CM | POA: Diagnosis not present

## 2020-04-13 DIAGNOSIS — E118 Type 2 diabetes mellitus with unspecified complications: Secondary | ICD-10-CM | POA: Diagnosis not present

## 2020-04-13 DIAGNOSIS — E559 Vitamin D deficiency, unspecified: Secondary | ICD-10-CM | POA: Diagnosis not present

## 2020-04-13 DIAGNOSIS — D509 Iron deficiency anemia, unspecified: Secondary | ICD-10-CM | POA: Diagnosis not present

## 2020-04-25 DIAGNOSIS — M1712 Unilateral primary osteoarthritis, left knee: Secondary | ICD-10-CM | POA: Diagnosis not present

## 2020-04-28 NOTE — Patient Instructions (Addendum)
DUE TO COVID-19 ONLY ONE VISITOR IS ALLOWED TO COME WITH YOU AND STAY IN THE WAITING ROOM ONLY DURING PRE OP AND PROCEDURE DAY OF SURGERY. THE 1 VISITOR  MAY VISIT WITH YOU AFTER SURGERY IN YOUR PRIVATE ROOM DURING VISITING HOURS ONLY!  YOU NEED TO HAVE A COVID 19 TEST ON: 05/06/20 @ 9:00 AM , THIS TEST MUST BE DONE BEFORE SURGERY,  COVID TESTING SITE [facility] [place] [address], IT IS ON THE RIGHT GOING OUT [address] APPROXIMATELY  2 MINUTES PAST [employer] ON THE RIGHT. ONCE YOUR COVID TEST IS COMPLETED,  PLEASE BEGIN THE QUARANTINE INSTRUCTIONS AS OUTLINED IN YOUR HANDOUT.                Stephonie Wilcoxen Morss    Your procedure is scheduled on: 05/10/20   Report to [facility] Main  Entrance   Report to short stay at: 5:15 AM     Call this number if you have problems the morning of surgery [phone]    Remember:  NO SOLID FOOD AFTER MIDNIGHT THE NIGHT PRIOR TO SURGERY. NOTHING BY MOUTH EXCEPT CLEAR LIQUIDS UNTIL: 4:30 AM .   CLEAR LIQUID DIET  Foods Allowed                                                                     Foods Excluded  Coffee and tea, regular and decaf                             liquids that you cannot  Plain Jell-O any favor except red or purple                                           see through such as: Fruit ices (not with fruit pulp)                                     milk, soups, orange juice  Iced Popsicles                                    All solid food Carbonated beverages, regular and diet                                    Cranberry, grape and apple juices Sports drinks like Gatorade Lightly seasoned clear broth or consume(fat free) Sugar, honey syrup  Sample Menu Breakfast                                Lunch                                     Supper Cranberry juice  Beef broth                            Chicken broth Jell-O                                     Grape juice                            Apple juice Coffee or tea                        Jell-O                                      Popsicle                                                Coffee or tea                        Coffee or tea  _____________________________________________________________________   BRUSH YOUR TEETH MORNING OF SURGERY AND RINSE YOUR MOUTH OUT, NO CHEWING GUM CANDY OR MINTS.    Take these medicines the morning of surgery with A SIP OF WATER: diltiazem.  How to Manage Your Diabetes Before and After Surgery  Why is it important to control my blood sugar before and after surgery? . Improving blood sugar levels before and after surgery helps healing and can limit problems. . A way of improving blood sugar control is eating a healthy diet by: o  Eating less sugar and carbohydrates o  Increasing activity/exercise o  Talking with your doctor about reaching your blood sugar goals . High blood sugars (greater than 180 mg/dL) can raise your risk of infections and slow your recovery, so you will need to focus on controlling your diabetes during the weeks before surgery. . Make sure that the doctor who takes care of your diabetes knows about your planned surgery including the date and location.  How do I manage my blood sugar before surgery? . Check your blood sugar at least 4 times a day, starting 2 days before surgery, to make sure that the level is not too high or low. o Check your blood sugar the morning of your surgery when you wake up and every 2 hours until you get to the Short Stay unit. . If your blood sugar is less than 70 mg/dL, you will need to treat for low blood sugar: o Do not take insulin. o Treat a low blood sugar (less than 70 mg/dL) with  cup of clear juice (cranberry or apple), 4 glucose tablets, OR glucose gel. o Recheck blood sugar in 15 minutes after treatment (to make sure it is greater than 70 mg/dL). If your blood sugar is not greater than 70 mg/dL on recheck, call  [phone] for further instructions. . Report your blood sugar to the short stay nurse when you get to Short Stay.  . If you are admitted to the hospital after surgery: o Your blood sugar will be checked by the staff and you will probably be given  insulin after surgery (instead of oral diabetes medicines) to make sure you have good blood sugar levels. o The goal for blood sugar control after surgery is 80-180 mg/dL.   WHAT DO I DO ABOUT MY DIABETES MEDICATION?  Marland Kitchen Do not take oral diabetes medicines (pills) the morning of surgery.  . THE DAY BEFORE SURGERY, take Metformin as usual.      THE MORNING OF SURGERY, DO NOT TAKE ANY DIABETIC MEDICATIONS DAY OF YOUR SURGERY                               You may not have any metal on your body including hair pins and              piercings  Do not wear jewelry, make-up, lotions, powders or perfumes, deodorant             Do not wear nail polish on your fingernails.  Do not shave  48 hours prior to surgery.    Do not bring valuables to the hospital. [place].  Contacts, dentures or bridgework may not be worn into surgery.  Leave suitcase in the car. After surgery it may be brought to your room.     Patients discharged the day of surgery will not be allowed to drive home. IF YOU ARE HAVING SURGERY AND GOING HOME THE SAME DAY, YOU MUST HAVE AN ADULT TO DRIVE YOU HOME AND BE WITH YOU FOR 24 HOURS. YOU MAY GO HOME BY TAXI OR UBER OR ORTHERWISE, BUT AN ADULT MUST ACCOMPANY YOU HOME AND STAY WITH YOU FOR 24 HOURS.  Name and phone number of your driver:  Special Instructions: N/A              Please read over the following fact sheets you were given: _____________________________________________________________________          [facility] - Preparing for Surgery Before surgery, you can play an important role.  Because skin is not sterile, your skin needs to be as free of germs as possible.  You  can reduce the number of germs on your skin by washing with CHG (chlorahexidine gluconate) soap before surgery.  CHG is an antiseptic cleaner which kills germs and bonds with the skin to continue killing germs even after washing. Please DO NOT use if you have an allergy to CHG or antibacterial soaps.  If your skin becomes reddened/irritated stop using the CHG and inform your nurse when you arrive at Short Stay. Do not shave (including legs and underarms) for at least 48 hours prior to the first CHG shower.  You may shave your face/neck. Please follow these instructions carefully:  1.  Shower with CHG Soap the night before surgery and the  morning of Surgery.  2.  If you choose to wash your hair, wash your hair first as usual with your  normal  shampoo.  3.  After you shampoo, rinse your hair and body thoroughly to remove the  shampoo.                           4.  Use CHG as you would any other liquid soap.  You can apply chg directly  to the skin and wash  Gently with a scrungie or clean washcloth.  5.  Apply the CHG Soap to your body ONLY FROM THE NECK DOWN.   Do not use on face/ open                           Wound or open sores. Avoid contact with eyes, ears mouth and genitals (private parts).                       Wash face,  Genitals (private parts) with your normal soap.             6.  Wash thoroughly, paying special attention to the area where your surgery  will be performed.  7.  Thoroughly rinse your body with warm water from the neck down.  8.  DO NOT shower/wash with your normal soap after using and rinsing off  the CHG Soap.                9.  Pat yourself dry with a clean towel.            10.  Wear clean pajamas.            11.  Place clean sheets on your bed the night of your first shower and do not  sleep with pets. Day of Surgery : Do not apply any lotions/deodorants the morning of surgery.  Please wear clean clothes to the hospital/surgery center.  FAILURE  TO FOLLOW THESE INSTRUCTIONS MAY RESULT IN THE CANCELLATION OF YOUR SURGERY PATIENT SIGNATURE_________________________________  NURSE SIGNATURE__________________________________  ________________________________________________________________________   Adam Phenix  An incentive spirometer is a tool that can help keep your lungs clear and active. This tool measures how well you are filling your lungs with each breath. Taking long deep breaths may help reverse or decrease the chance of developing breathing (pulmonary) problems (especially infection) following:  A long period of time when you are unable to move or be active. BEFORE THE PROCEDURE   If the spirometer includes an indicator to show your best effort, your nurse or respiratory therapist will set it to a desired goal.  If possible, sit up straight or lean slightly forward. Try not to slouch.  Hold the incentive spirometer in an upright position. INSTRUCTIONS FOR USE  1. Sit on the edge of your bed if possible, or sit up as far as you can in bed or on a chair. 2. Hold the incentive spirometer in an upright position. 3. Breathe out normally. 4. Place the mouthpiece in your mouth and seal your lips tightly around it. 5. Breathe in slowly and as deeply as possible, raising the piston or the ball toward the top of the column. 6. Hold your breath for 3-5 seconds or for as long as possible. Allow the piston or ball to fall to the bottom of the column. 7. Remove the mouthpiece from your mouth and breathe out normally. 8. Rest for a few seconds and repeat Steps 1 through 7 at least 10 times every 1-2 hours when you are awake. Take your time and take a few normal breaths between deep breaths. 9. The spirometer may include an indicator to show your best effort. Use the indicator as a goal to work toward during each repetition. 10. After each set of 10 deep breaths, practice coughing to be sure your lungs are clear. If you have an  incision (the cut made at the time of  surgery), support your incision when coughing by placing a pillow or rolled up towels firmly against it. Once you are able to get out of bed, walk around indoors and cough well. You may stop using the incentive spirometer when instructed by your caregiver.  RISKS AND COMPLICATIONS  Take your time so you do not get dizzy or light-headed.  If you are in pain, you may need to take or ask for pain medication before doing incentive spirometry. It is harder to take a deep breath if you are having pain. AFTER USE  Rest and breathe slowly and easily.  It can be helpful to keep track of a log of your progress. Your caregiver can provide you with a simple table to help with this. If you are using the spirometer at home, follow these instructions: [place] IF:   You are having difficultly using the spirometer.  You have trouble using the spirometer as often as instructed.  Your pain medication is not giving enough relief while using the spirometer.  You develop fever of 100.5 F (38.1 C) or higher. SEEK IMMEDIATE MEDICAL CARE IF:   You cough up bloody sputum that had not been present before.  You develop fever of 102 F (38.9 C) or greater.  You develop worsening pain at or near the incision site. MAKE SURE YOU:   Understand these instructions.  Will watch your condition.  Will get help right away if you are not doing well or get worse. Document Released: 05/14/2006 Document Revised: 03/26/2011 Document Reviewed: 07/15/2006 [facility] Patient Information 2014 [place], [place].   ________________________________________________________________________

## 2020-05-02 ENCOUNTER — Other Ambulatory Visit: Payer: Self-pay

## 2020-05-02 ENCOUNTER — Encounter (HOSPITAL_COMMUNITY): Payer: Self-pay

## 2020-05-02 ENCOUNTER — Encounter (HOSPITAL_COMMUNITY)
Admission: RE | Admit: 2020-05-02 | Discharge: 2020-05-02 | Disposition: A | Discharge status: Home / Self Care | Payer: Medicare PPO | Source: Ambulatory Visit | Attending: Orthopedic Surgery | Admitting: Orthopedic Surgery

## 2020-05-02 DIAGNOSIS — Z01812 Encounter for preprocedural laboratory examination: Secondary | ICD-10-CM | POA: Diagnosis not present

## 2020-05-02 HISTORY — DX: Unspecified osteoarthritis, unspecified site: M19.90

## 2020-05-02 LAB — CBC
HCT: 42.7 % (ref 36.0–46.0)
Hemoglobin: 14 g/dL (ref 12.0–15.0)
MCH: 29.4 pg (ref 26.0–34.0)
MCHC: 32.8 g/dL (ref 30.0–36.0)
MCV: 89.7 fL (ref 80.0–100.0)
Platelets: 340 10*3/uL (ref 150–400)
RBC: 4.76 MIL/uL (ref 3.87–5.11)
RDW: 13 % (ref 11.5–15.5)
WBC: 10.6 10*3/uL — ABNORMAL HIGH (ref 4.0–10.5)
nRBC: 0 % (ref 0.0–0.2)

## 2020-05-02 LAB — SURGICAL PCR SCREEN
MRSA, PCR: NEGATIVE
Staphylococcus aureus: NEGATIVE

## 2020-05-02 LAB — BASIC METABOLIC PANEL
Anion gap: 10 (ref 5–15)
BUN: 15 mg/dL (ref 8–23)
CO2: 30 mmol/L (ref 22–32)
Calcium: 9.9 mg/dL (ref 8.9–10.3)
Chloride: 102 mmol/L (ref 98–111)
Creatinine, Ser: 0.84 mg/dL (ref 0.44–1.00)
GFR, Estimated: >60 mL/min (ref >60)
Glucose, Bld: 87 mg/dL (ref 70–99)
Potassium: 3.6 mmol/L (ref 3.5–5.1)
Sodium: 142 mmol/L (ref 135–145)

## 2020-05-02 LAB — HEMOGLOBIN A1C
Hgb A1c MFr Bld: 6.5 % — ABNORMAL HIGH (ref 4.8–5.6)
Mean Plasma Glucose: 139.85 mg/dL

## 2020-05-02 LAB — GLUCOSE, CAPILLARY: Glucose-Capillary: 90 mg/dL (ref 70–99)

## 2020-05-02 NOTE — Care Plan (Signed)
Ortho Bundle Case Management Note  Patient Details  Name: Patricia Sharp MRN: [id number] Date of Birth: March 19, 1950   met with patient in the office prior to surgery. She will discharge to home with family to assist. Has walker, CPM ordered. OPPT set up with [facility]. Patient and MD in agreement with plan. Choice offered                   DME Arranged:  [facility] DME Agency:  [employer]  HH Arranged:    [place] Agency:     Additional Comments: Please contact me with any questions of if this plan should need to change.  Ladell Heads,  [place] [facility] Specialist  [phone] 05/02/2020, 4:56 PM

## 2020-05-02 NOTE — Progress Notes (Signed)
COVID Vaccine Completed: Yes Date COVID Vaccine completed: 04/14/19 COVID vaccine manufacturer:  Moderna     PCP -Dr. Sharilyn Sites  Cardiologist -   Chest x-ray -  EKG - 04/13/20: Chart Stress Test -  ECHO - 12/16/17 Cardiac Cath -  Pacemaker/ICD device last checked:  Sleep Study -  CPAP -   Fasting Blood Sugar - N/A Checks Blood Sugar __0___ times a day  Blood Thinner Instructions: Aspirin Instructions:Hold 5 days before as per surgeon's instructions. Last Dose:  Anesthesia review: HTN,DIA  Patient denies shortness of breath, fever, cough and chest pain at PAT appointment   Patient verbalized understanding of instructions that were given to them at the PAT appointment. Patient was also instructed that they will need to review over the PAT instructions again at home before surgery.

## 2020-05-06 ENCOUNTER — Other Ambulatory Visit (HOSPITAL_COMMUNITY)
Admission: RE | Admit: 2020-05-06 | Discharge: 2020-05-06 | Disposition: A | Discharge status: Home / Self Care | Payer: Medicare PPO | Source: Ambulatory Visit | Attending: Orthopedic Surgery | Admitting: Orthopedic Surgery

## 2020-05-06 DIAGNOSIS — Z20822 Contact with and (suspected) exposure to covid-19: Secondary | ICD-10-CM | POA: Insufficient documentation

## 2020-05-06 DIAGNOSIS — Z01812 Encounter for preprocedural laboratory examination: Secondary | ICD-10-CM | POA: Diagnosis not present

## 2020-05-06 LAB — SARS CORONAVIRUS 2 (TAT 6-24 HRS): SARS Coronavirus 2: NEGATIVE

## 2020-05-06 NOTE — H&P (Signed)
KNEE ARTHROPLASTY ADMISSION H&P  Patient ID: DANNON PERLOW MRN: [id number] DOB/AGE: 1950/04/07 70 y.o.  Chief Complaint: left knee pain.  Planned Procedure Date: 05/10/20 Medical Clearance by Dr. Hilma Favors   Cardiac Clearance by Dr. Hilma Favors   HPI: Patricia Sharp is a 70 y.o. female who presents for evaluation of djd left knee. The patient has a history of pain and functional disability in the left knee due to arthritis and has failed non-surgical conservative treatments for greater than 12 weeks to include NSAID's and/or analgesics, corticosteriod injections, viscosupplementation injections, use of assistive devices, weight reduction as appropriate and activity modification.  Onset of symptoms was gradual, starting 2 years ago with gradually worsening course since that time. The patient noted no past surgery on the left knee.  Patient currently rates pain at 5 out of 10 with activity. Patient has night pain, worsening of pain with activity and weight bearing and pain that interferes with activities of daily living.  Patient has evidence of subchondral sclerosis, periarticular osteophytes and joint space narrowing by imaging studies.  There is no active infection.  Past Medical History:  Diagnosis Date  . Anxiety   . Arthritis   . Depression   . Diabetes mellitus without complication ([place])   . Essential hypertension, benign 10/29/2017  . Hypertension    Past Surgical History:  Procedure Laterality Date  . ABDOMINAL HYSTERECTOMY    . ABDOMINAL SURGERY     to remove fibroid tumors  . BIOPSY  11/25/2017   Procedure: BIOPSY;  Surgeon: Rogene Houston, MD;  Location: [facility];  Service: Endoscopy;;  gastric  . CHOLECYSTECTOMY    . COLONOSCOPY N/A 01/02/2013   Procedure: COLONOSCOPY;  Surgeon: Rogene Houston, MD;  Location: [facility];  Service: Endoscopy;  Laterality: N/A;  825  . ESOPHAGOGASTRODUODENOSCOPY N/A 11/25/2017   Procedure: ESOPHAGOGASTRODUODENOSCOPY (EGD);  Surgeon:  Rogene Houston, MD;  Location: [facility];  Service: Endoscopy;  Laterality: N/A;  12:00-moved to 11/11 @ 9:55am per Lelon Frohlich  . REFRACTIVE SURGERY Bilateral    Allergies  Allergen Reactions  . Azithromycin Rash  . Cefuroxime Axetil Rash   Prior to Admission medications   Medication Sig Start Date End Date Taking? Authorizing Provider  acetaminophen (TYLENOL) 650 MG CR tablet Take 1,300 mg by mouth every 8 (eight) hours as needed for pain.   Yes [provider]  ALPRAZolam (XANAX) 0.5 MG tablet Take 0.25-0.5 mg by mouth at bedtime as needed for anxiety or sleep.    Yes [provider]  aspirin EC 81 MG tablet Take 1 tablet (81 mg total) by mouth daily. 11/26/17  Yes Rehman, Mechele Dawley, MD  Carboxymethylcellulose Sodium (THERATEARS OP) Place 1 drop into both eyes daily.   Yes [provider]  Cholecalciferol (VITAMIN D3) 1000 units CAPS Take 1,000 Units by mouth daily.    Yes [provider]  diltiazem (TIAZAC) 360 MG 24 hr capsule Take 360 mg by mouth daily.   Yes [provider]  furosemide (LASIX) 40 MG tablet Take 40 mg by mouth daily as needed for fluid.    Yes [provider]  hydrochlorothiazide (HYDRODIURIL) 25 MG tablet Take 25 mg by mouth daily.   Yes [provider]  ibuprofen (ADVIL) 200 MG tablet Take 400 mg by mouth every 8 (eight) hours as needed for moderate pain.   Yes [provider]  metFORMIN (GLUCOPHAGE) 500 MG tablet Take 250 mg by mouth 2 (two) times daily with a  meal.   Yes [provider]  Omega-3 Fatty Acids (FISH OIL) 1000 MG CAPS Take 1,000 mg by mouth daily.   Yes [provider]  quinapril (ACCUPRIL) 40 MG tablet Take 40 mg by mouth daily.    Yes [provider]   Social History   Socioeconomic History  . Marital status: Widowed    Spouse name: Not on file  . Number of children: Not on file  . Years of education: Not on file  . Highest education level: Not on  file  Occupational History  . Not on file  Tobacco Use  . Smoking status: Never Smoker  . Smokeless tobacco: Never Used  Vaping Use  . Vaping Use: Never used  Substance and Sexual Activity  . Alcohol use: No  . Drug use: No  . Sexual activity: Not on file  Other Topics Concern  . Not on file  Social History Narrative  . Not on file   Social Determinants of Health   Financial Resource Strain: Not on file  Food Insecurity: Not on file  Transportation Needs: Not on file  Physical Activity: Not on file  Stress: Not on file  Social Connections: Not on file   Family History  Problem Relation Age of Onset  . Colon cancer Neg Hx     ROS: Currently denies lightheadedness, dizziness, Fever, chills, CP, SOB.   No personal history of DVT, PE, MI, or CVA. No loose teeth or dentures. All other systems have been reviewed and were otherwise currently negative with the exception of those mentioned in the HPI and as above.  Objective: Vitals: Ht: 4'11" Wt: 165.8 lbs Temp: 97.6 BP: 171/76 Pulse: 65 O2 95% on room air.   Physical Exam: General: Alert, NAD.  Antalgic Gait  HEENT: EOMI, Good Neck Extension, head is normocephalic, atraumatic. No pharyngeal erythema Pulm: No increased work of breathing.  Clear B/L A/P w/o crackle or wheeze.  CV: RRR, No m/g/r appreciated  GI: soft, NT, ND. BS x 4 quadrants Neuro: CN II-XII grossly intact without focal deficit.  Sensation intact distally Skin: No lesions in the area of chief complaint MSK/Surgical Site: left knee w/o redness or effusion. Pain with ROM. Not TTP. ROM 0-120.  Decreased strength.  +EHL/FHL. NVI.    Imaging Review Plain radiographs demonstrate moderate degenerative joint disease of the left knee.   The overall alignment ismild valgus. The bone quality appears to be fair for age and reported activity level.  Preoperative templating of the joint replacement has been completed, documented, and submitted to the Operating Room  personnel in order to optimize intra-operative equipment management.  Assessment: djd left knee Active Problems:   * No active hospital problems. *   Plan: Plan for Procedure(s): TOTAL KNEE ARTHROPLASTY  The patient history, physical exam, clinical judgement of the provider and imaging are consistent with end stage degenerative joint disease and total joint arthroplasty is deemed medically necessary. The treatment options including medical management, injection therapy, and arthroplasty were discussed at length. The risks and benefits of Procedure(s): TOTAL KNEE ARTHROPLASTY were presented and reviewed.  The risks of nonoperative treatment, versus surgical intervention including but not limited to continued pain, aseptic loosening, stiffness, dislocation/subluxation, infection, bleeding, nerve injury, blood clots, cardiopulmonary complications, morbidity, mortality, among others were discussed. The patient verbalizes understanding and wishes to proceed with the plan.   Patient is being [facility] to [facility] for inpatient treatment for surgery, pain control, PT, prophylactic antibiotics, VTE prophylaxis, progressive ambulation, ADL's and  discharge planning.   Dental prophylaxis discussed and recommended for 2 years postoperatively.   The patient does meet the criteria for TXA which will be used perioperatively.    ASA 81 mg BID will be used postoperatively for DVT prophylaxis in addition to SCDs, and early ambulation.  Plan for Tylenol, Celebrex, oxycodone for pain.   Robaxin for muscle spasms.    Zofran for nausea and vomiting.  Omeprazole for gastric protection.  Prescriptions will be sent to [facility].  The patient is planning to be discharged home with OPPT and into the care of her friend Terrence Dupont who can be reached at [phone] and her daughter Lexine Baton who can be reached at [phone].  Follow up appt is 05/18/20 at 11:15am    Alisa Graff Office  [phone] 05/06/2020 1:35 PM

## 2020-05-10 ENCOUNTER — Observation Stay (HOSPITAL_COMMUNITY): Payer: Medicare PPO

## 2020-05-10 ENCOUNTER — Other Ambulatory Visit: Payer: Self-pay

## 2020-05-10 ENCOUNTER — Ambulatory Visit (HOSPITAL_COMMUNITY): Payer: Medicare PPO | Admitting: Certified Registered"

## 2020-05-10 ENCOUNTER — Observation Stay (HOSPITAL_COMMUNITY)
Admission: RE | Admit: 2020-05-10 | Discharge: 2020-05-11 | Disposition: A | Discharge status: Home / Self Care | Payer: Medicare PPO | Source: Ambulatory Visit | Attending: Orthopedic Surgery | Admitting: Orthopedic Surgery

## 2020-05-10 ENCOUNTER — Encounter (HOSPITAL_COMMUNITY)
Admission: RE | Disposition: A | Discharge status: Home / Self Care | Payer: Self-pay | Source: Ambulatory Visit | Attending: Orthopedic Surgery

## 2020-05-10 ENCOUNTER — Encounter (HOSPITAL_COMMUNITY): Payer: Self-pay | Admitting: Orthopedic Surgery

## 2020-05-10 DIAGNOSIS — Z79899 Other long term (current) drug therapy: Secondary | ICD-10-CM | POA: Insufficient documentation

## 2020-05-10 DIAGNOSIS — Z7982 Long term (current) use of aspirin: Secondary | ICD-10-CM | POA: Insufficient documentation

## 2020-05-10 DIAGNOSIS — M1712 Unilateral primary osteoarthritis, left knee: Principal | ICD-10-CM | POA: Insufficient documentation

## 2020-05-10 DIAGNOSIS — Z96652 Presence of left artificial knee joint: Secondary | ICD-10-CM

## 2020-05-10 DIAGNOSIS — E119 Type 2 diabetes mellitus without complications: Secondary | ICD-10-CM | POA: Diagnosis not present

## 2020-05-10 DIAGNOSIS — Z7984 Long term (current) use of oral hypoglycemic drugs: Secondary | ICD-10-CM | POA: Insufficient documentation

## 2020-05-10 DIAGNOSIS — G8918 Other acute postprocedural pain: Secondary | ICD-10-CM | POA: Diagnosis not present

## 2020-05-10 DIAGNOSIS — I10 Essential (primary) hypertension: Secondary | ICD-10-CM | POA: Insufficient documentation

## 2020-05-10 HISTORY — PX: TOTAL KNEE ARTHROPLASTY: SHX125

## 2020-05-10 LAB — GLUCOSE, CAPILLARY
Glucose-Capillary: 104 mg/dL — ABNORMAL HIGH (ref 70–99)
Glucose-Capillary: 145 mg/dL — ABNORMAL HIGH (ref 70–99)

## 2020-05-10 SURGERY — ARTHROPLASTY, KNEE, TOTAL
Anesthesia: Regional | Site: Knee | Laterality: Left

## 2020-05-10 MED ORDER — LISINOPRIL 20 MG PO TABS
40.0000 mg | ORAL_TABLET | Freq: Every day | ORAL | Status: DC
  Administered 2020-05-10 – 2020-05-11 (×2): 40 mg via ORAL
  Filled 2020-05-10 (×2): qty 2

## 2020-05-10 MED ORDER — MIDAZOLAM HCL 2 MG/2ML IJ SOLN
INTRAMUSCULAR | Status: AC
  Filled 2020-05-10: qty 2

## 2020-05-10 MED ORDER — PANTOPRAZOLE SODIUM 40 MG PO TBEC
40.0000 mg | DELAYED_RELEASE_TABLET | Freq: Every day | ORAL | Status: DC
  Administered 2020-05-11: 40 mg via ORAL
  Filled 2020-05-10 (×2): qty 1

## 2020-05-10 MED ORDER — DOCUSATE SODIUM 100 MG PO CAPS
100.0000 mg | ORAL_CAPSULE | Freq: Two times a day (BID) | ORAL | Status: DC
  Administered 2020-05-10 – 2020-05-11 (×3): 100 mg via ORAL
  Filled 2020-05-10 (×3): qty 1

## 2020-05-10 MED ORDER — HYDROMORPHONE HCL 1 MG/ML IJ SOLN: 0.5000 mg | INTRAMUSCULAR | Status: DC | PRN

## 2020-05-10 MED ORDER — SODIUM CHLORIDE 0.9 % IV SOLN
1.0000 g | Freq: Four times a day (QID) | INTRAVENOUS | Status: AC
  Administered 2020-05-10 (×2): 1 g via INTRAVENOUS
  Filled 2020-05-10 (×2): qty 10

## 2020-05-10 MED ORDER — FENTANYL CITRATE (PF) 250 MCG/5ML IJ SOLN
INTRAMUSCULAR | Status: AC
  Filled 2020-05-10: qty 5

## 2020-05-10 MED ORDER — CEFAZOLIN SODIUM-DEXTROSE 2-4 GM/100ML-% IV SOLN
INTRAVENOUS | Status: AC
  Filled 2020-05-10: qty 100

## 2020-05-10 MED ORDER — AMISULPRIDE (ANTIEMETIC) 5 MG/2ML IV SOLN: 10.0000 mg | Freq: Once | INTRAVENOUS | Status: DC | PRN

## 2020-05-10 MED ORDER — ONDANSETRON HCL 4 MG/2ML IJ SOLN
INTRAMUSCULAR | Status: DC | PRN
  Administered 2020-05-10: 4 mg via INTRAVENOUS

## 2020-05-10 MED ORDER — BUPIVACAINE LIPOSOME 1.3 % IJ SUSP
20.0000 mL | Freq: Once | INTRAMUSCULAR | Status: DC
  Filled 2020-05-10: qty 20

## 2020-05-10 MED ORDER — PHENOL 1.4 % MT LIQD: 1.0000 | OROMUCOSAL | Status: DC | PRN

## 2020-05-10 MED ORDER — MIDAZOLAM HCL 2 MG/2ML IJ SOLN
INTRAMUSCULAR | Status: DC | PRN
  Administered 2020-05-10: .5 mg via INTRAVENOUS
  Administered 2020-05-10: 2 mg via INTRAVENOUS
  Administered 2020-05-10: 1 mg via INTRAVENOUS
  Administered 2020-05-10: .5 mg via INTRAVENOUS

## 2020-05-10 MED ORDER — METOCLOPRAMIDE HCL 5 MG PO TABS
5.0000 mg | ORAL_TABLET | Freq: Three times a day (TID) | ORAL | Status: DC | PRN
  Filled 2020-05-10: qty 2

## 2020-05-10 MED ORDER — BISACODYL 10 MG RE SUPP: 10.0000 mg | Freq: Every day | RECTAL | Status: DC | PRN

## 2020-05-10 MED ORDER — METHOCARBAMOL 500 MG PO TABS
500.0000 mg | ORAL_TABLET | Freq: Four times a day (QID) | ORAL | Status: DC | PRN
  Administered 2020-05-10: 500 mg via ORAL
  Filled 2020-05-10: qty 1

## 2020-05-10 MED ORDER — VANCOMYCIN HCL IN DEXTROSE 1-5 GM/200ML-% IV SOLN
1000.0000 mg | INTRAVENOUS | Status: AC
  Administered 2020-05-10: 500 mg via INTRAVENOUS
  Filled 2020-05-10: qty 200

## 2020-05-10 MED ORDER — OXYCODONE HCL 5 MG PO TABS
10.0000 mg | ORAL_TABLET | ORAL | Status: DC | PRN
  Administered 2020-05-10 – 2020-05-11 (×2): 10 mg via ORAL
  Filled 2020-05-10 (×2): qty 2

## 2020-05-10 MED ORDER — DILTIAZEM HCL ER BEADS 240 MG PO CP24: 360.0000 mg | ORAL_CAPSULE | Freq: Every day | ORAL | Status: DC

## 2020-05-10 MED ORDER — PROPOFOL 10 MG/ML IV BOLUS
INTRAVENOUS | Status: DC | PRN
  Administered 2020-05-10: 30 mg via INTRAVENOUS
  Administered 2020-05-10 (×2): 20 mg via INTRAVENOUS

## 2020-05-10 MED ORDER — STERILE WATER FOR IRRIGATION IR SOLN
Status: DC | PRN
  Administered 2020-05-10: 2000 mL

## 2020-05-10 MED ORDER — BUPIVACAINE IN DEXTROSE 0.75-8.25 % IT SOLN
INTRATHECAL | Status: DC | PRN
  Administered 2020-05-10: 1.6 mL via INTRATHECAL

## 2020-05-10 MED ORDER — HYDROCHLOROTHIAZIDE 25 MG PO TABS
25.0000 mg | ORAL_TABLET | Freq: Every day | ORAL | Status: DC
  Administered 2020-05-10 – 2020-05-11 (×2): 25 mg via ORAL
  Filled 2020-05-10 (×2): qty 1

## 2020-05-10 MED ORDER — ACETAMINOPHEN 500 MG PO TABS
1000.0000 mg | ORAL_TABLET | Freq: Once | ORAL | Status: AC
  Administered 2020-05-10: 1000 mg via ORAL
  Filled 2020-05-10: qty 2

## 2020-05-10 MED ORDER — BUPIVACAINE-EPINEPHRINE (PF) 0.5% -1:200000 IJ SOLN
INTRAMUSCULAR | Status: DC | PRN
  Administered 2020-05-10: 30 mL via PERINEURAL

## 2020-05-10 MED ORDER — ONDANSETRON HCL 4 MG/2ML IJ SOLN: 4.0000 mg | Freq: Once | INTRAMUSCULAR | Status: DC | PRN

## 2020-05-10 MED ORDER — TRANEXAMIC ACID-NACL 1000-0.7 MG/100ML-% IV SOLN
1000.0000 mg | INTRAVENOUS | Status: AC
  Administered 2020-05-10: 1000 mg via INTRAVENOUS
  Filled 2020-05-10: qty 100

## 2020-05-10 MED ORDER — DIPHENHYDRAMINE HCL 12.5 MG/5ML PO ELIX
12.5000 mg | ORAL_SOLUTION | ORAL | Status: DC | PRN
Start: 2020-05-10 — End: 2020-05-11

## 2020-05-10 MED ORDER — POLYETHYLENE GLYCOL 3350 17 G PO PACK: 17.0000 g | PACK | Freq: Every day | ORAL | Status: DC | PRN

## 2020-05-10 MED ORDER — DILTIAZEM HCL ER COATED BEADS 180 MG PO CP24
360.0000 mg | ORAL_CAPSULE | Freq: Every day | ORAL | Status: DC
  Administered 2020-05-11: 360 mg via ORAL
  Filled 2020-05-10: qty 2

## 2020-05-10 MED ORDER — TRANEXAMIC ACID-NACL 1000-0.7 MG/100ML-% IV SOLN
1000.0000 mg | Freq: Once | INTRAVENOUS | Status: AC
  Administered 2020-05-10: 1000 mg via INTRAVENOUS
  Filled 2020-05-10: qty 100

## 2020-05-10 MED ORDER — DEXAMETHASONE SODIUM PHOSPHATE 10 MG/ML IJ SOLN: 8.0000 mg | Freq: Once | INTRAMUSCULAR | Status: DC

## 2020-05-10 MED ORDER — ALUM & MAG HYDROXIDE-SIMETH 200-200-20 MG/5ML PO SUSP: 30.0000 mL | ORAL | Status: DC | PRN

## 2020-05-10 MED ORDER — METFORMIN HCL 500 MG PO TABS
250.0000 mg | ORAL_TABLET | Freq: Two times a day (BID) | ORAL | Status: DC
  Administered 2020-05-10 – 2020-05-11 (×2): 250 mg via ORAL
  Filled 2020-05-10 (×2): qty 1

## 2020-05-10 MED ORDER — QUINAPRIL HCL 10 MG PO TABS
40.0000 mg | ORAL_TABLET | Freq: Every day | ORAL | Status: DC
  Filled 2020-05-10: qty 4

## 2020-05-10 MED ORDER — FENTANYL CITRATE (PF) 250 MCG/5ML IJ SOLN
INTRAMUSCULAR | Status: DC | PRN
  Administered 2020-05-10: 50 ug via INTRAVENOUS
  Administered 2020-05-10: 25 ug via INTRAVENOUS
  Administered 2020-05-10 (×2): 50 ug via INTRAVENOUS

## 2020-05-10 MED ORDER — ONDANSETRON HCL 4 MG/2ML IJ SOLN: 4.0000 mg | Freq: Four times a day (QID) | INTRAMUSCULAR | Status: DC | PRN

## 2020-05-10 MED ORDER — ALPRAZOLAM 0.25 MG PO TABS
0.2500 mg | ORAL_TABLET | Freq: Every evening | ORAL | Status: DC | PRN
  Administered 2020-05-10: 0.5 mg via ORAL
  Filled 2020-05-10: qty 2

## 2020-05-10 MED ORDER — LIDOCAINE 2% (20 MG/ML) 5 ML SYRINGE
INTRAMUSCULAR | Status: DC | PRN
  Administered 2020-05-10: 40 mg via INTRAVENOUS

## 2020-05-10 MED ORDER — DEXAMETHASONE SODIUM PHOSPHATE 10 MG/ML IJ SOLN
INTRAMUSCULAR | Status: DC | PRN
  Administered 2020-05-10: 8 mg via INTRAVENOUS

## 2020-05-10 MED ORDER — LACTATED RINGERS IV SOLN: INTRAVENOUS | Status: DC

## 2020-05-10 MED ORDER — MAGNESIUM CITRATE PO SOLN: 1.0000 | Freq: Once | ORAL | Status: DC | PRN

## 2020-05-10 MED ORDER — METOCLOPRAMIDE HCL 5 MG/ML IJ SOLN: 5.0000 mg | Freq: Three times a day (TID) | INTRAMUSCULAR | Status: DC | PRN

## 2020-05-10 MED ORDER — ONDANSETRON HCL 4 MG PO TABS
4.0000 mg | ORAL_TABLET | Freq: Four times a day (QID) | ORAL | Status: DC | PRN
  Filled 2020-05-10: qty 1

## 2020-05-10 MED ORDER — TRAMADOL HCL 50 MG PO TABS
50.0000 mg | ORAL_TABLET | Freq: Four times a day (QID) | ORAL | Status: DC
Start: 2020-05-10 — End: 2020-05-11
  Administered 2020-05-10 – 2020-05-11 (×5): 50 mg via ORAL
  Filled 2020-05-10 (×5): qty 1

## 2020-05-10 MED ORDER — 0.9 % SODIUM CHLORIDE (POUR BTL) OPTIME
TOPICAL | Status: DC | PRN
  Administered 2020-05-10: 1000 mL

## 2020-05-10 MED ORDER — SODIUM CHLORIDE (PF) 0.9 % IJ SOLN
INTRAMUSCULAR | Status: AC
  Filled 2020-05-10: qty 30

## 2020-05-10 MED ORDER — OXYCODONE HCL 5 MG PO TABS
5.0000 mg | ORAL_TABLET | ORAL | Status: DC | PRN
  Administered 2020-05-10 – 2020-05-11 (×2): 10 mg via ORAL
  Filled 2020-05-10 (×2): qty 2

## 2020-05-10 MED ORDER — MENTHOL 3 MG MT LOZG: 1.0000 | LOZENGE | OROMUCOSAL | Status: DC | PRN

## 2020-05-10 MED ORDER — ASPIRIN 81 MG PO CHEW
81.0000 mg | CHEWABLE_TABLET | Freq: Two times a day (BID) | ORAL | Status: DC
  Administered 2020-05-10 – 2020-05-11 (×2): 81 mg via ORAL
  Filled 2020-05-10 (×2): qty 1

## 2020-05-10 MED ORDER — METHOCARBAMOL 500 MG IVPB - SIMPLE MED
500.0000 mg | Freq: Four times a day (QID) | INTRAVENOUS | Status: DC | PRN
  Filled 2020-05-10: qty 50

## 2020-05-10 MED ORDER — CHLORHEXIDINE GLUCONATE 0.12 % MT SOLN
15.0000 mL | Freq: Once | OROMUCOSAL | Status: AC
  Administered 2020-05-10: 15 mL via OROMUCOSAL

## 2020-05-10 MED ORDER — SODIUM CHLORIDE 0.9% FLUSH
INTRAVENOUS | Status: DC | PRN
  Administered 2020-05-10: 30 mL

## 2020-05-10 MED ORDER — PROPOFOL 500 MG/50ML IV EMUL
INTRAVENOUS | Status: DC | PRN
  Administered 2020-05-10: 50 ug/kg/min via INTRAVENOUS

## 2020-05-10 MED ORDER — CEFAZOLIN SODIUM-DEXTROSE 2-3 GM-%(50ML) IV SOLR
INTRAVENOUS | Status: DC | PRN
  Administered 2020-05-10: 2 g via INTRAVENOUS

## 2020-05-10 MED ORDER — FENTANYL CITRATE (PF) 100 MCG/2ML IJ SOLN: 25.0000 ug | INTRAMUSCULAR | Status: DC | PRN

## 2020-05-10 MED ORDER — LACTATED RINGERS IV SOLN: INTRAVENOUS | Status: DC | PRN

## 2020-05-10 MED ORDER — BUPIVACAINE LIPOSOME 1.3 % IJ SUSP
INTRAMUSCULAR | Status: DC | PRN
  Administered 2020-05-10: 20 mL

## 2020-05-10 MED ORDER — DEXAMETHASONE SODIUM PHOSPHATE 10 MG/ML IJ SOLN
10.0000 mg | Freq: Once | INTRAMUSCULAR | Status: AC
  Administered 2020-05-11: 10 mg via INTRAVENOUS
  Filled 2020-05-10: qty 1

## 2020-05-10 MED ORDER — ACETAMINOPHEN 325 MG PO TABS: 325.0000 mg | ORAL_TABLET | Freq: Four times a day (QID) | ORAL | Status: DC | PRN

## 2020-05-10 MED ORDER — ORAL CARE MOUTH RINSE: 15.0000 mL | Freq: Once | OROMUCOSAL | Status: AC

## 2020-05-10 MED ORDER — ACETAMINOPHEN 500 MG PO TABS
1000.0000 mg | ORAL_TABLET | Freq: Four times a day (QID) | ORAL | Status: AC
  Administered 2020-05-10 – 2020-05-11 (×4): 1000 mg via ORAL
  Filled 2020-05-10 (×4): qty 2

## 2020-05-10 SURGICAL SUPPLY — 58 items
BASEPLATE TIBIAL SZ2 TRI (Joint) ×1 IMPLANT
BLADE HEX COATED 2.75 (ELECTRODE) ×2 IMPLANT
BLADE SAG 18X100X1.27 (BLADE) ×2 IMPLANT
BLADE SAGITTAL 25.0X1.37X90 (BLADE) ×2 IMPLANT
BLADE SURG 15 STRL LF DISP TIS (BLADE) ×1 IMPLANT
BLADE SURG 15 STRL SS (BLADE) ×2
BLADE SURG SZ10 CARB STEEL (BLADE) ×4 IMPLANT
BNDG CMPR MED 10X6 ELC LF (GAUZE/BANDAGES/DRESSINGS) ×1
BNDG CMPR MED 15X6 ELC VLCR LF (GAUZE/BANDAGES/DRESSINGS) ×1
BNDG ELASTIC 6X10 VLCR STRL LF (GAUZE/BANDAGES/DRESSINGS) ×2 IMPLANT
BNDG ELASTIC 6X15 VLCR STRL LF (GAUZE/BANDAGES/DRESSINGS) ×2 IMPLANT
BOWL SMART MIX CTS (DISPOSABLE) IMPLANT
BSPLAT TIB 2 KN TRITANIUM (Joint) ×1 IMPLANT
CLSR STERI-STRIP ANTIMIC 1/2X4 (GAUZE/BANDAGES/DRESSINGS) ×2 IMPLANT
COMP FEMORAL TRIATHLON SZ3 (Joint) ×2 IMPLANT
COMPONENT FEMRL TRIATHLON SZ3 (Joint) ×1 IMPLANT
COVER SURGICAL LIGHT HANDLE (MISCELLANEOUS) ×2 IMPLANT
COVER WAND RF STERILE (DRAPES) IMPLANT
CUFF TOURN SGL QUICK 34 (TOURNIQUET CUFF) ×2
CUFF TRNQT CYL 34X4.125X (TOURNIQUET CUFF) ×1 IMPLANT
DECANTER SPIKE VIAL GLASS SM (MISCELLANEOUS) ×2 IMPLANT
DRAPE U-SHAPE 47X51 STRL (DRAPES) ×2 IMPLANT
DRSG MEPILEX BORDER 4X12 (GAUZE/BANDAGES/DRESSINGS) ×2 IMPLANT
DRSG MEPILEX BORDER 4X8 (GAUZE/BANDAGES/DRESSINGS) ×2 IMPLANT
DURAPREP 26ML APPLICATOR (WOUND CARE) ×4 IMPLANT
GLOVE SRG 8 PF TXTR STRL LF DI (GLOVE) ×1 IMPLANT
GLOVE SURG ENC MOIS LTX SZ7.5 (GLOVE) ×2 IMPLANT
GLOVE SURG ENC TEXT LTX SZ7.5 (GLOVE) ×2 IMPLANT
GLOVE SURG UNDER POLY LF SZ7.5 (GLOVE) ×2 IMPLANT
GLOVE SURG UNDER POLY LF SZ8 (GLOVE) ×2
GOWN STRL REUS W/ TWL LRG LVL3 (GOWN DISPOSABLE) ×1 IMPLANT
GOWN STRL REUS W/TWL LRG LVL3 (GOWN DISPOSABLE) ×2
GOWN STRL REUS W/TWL XL LVL3 (GOWN DISPOSABLE) ×2 IMPLANT
HANDPIECE INTERPULSE COAX TIP (DISPOSABLE) ×2
HOLDER FOLEY CATH W/STRAP (MISCELLANEOUS) IMPLANT
IMMOBILIZER KNEE 22 UNIV (SOFTGOODS) ×2 IMPLANT
INSERT TIBIA KNEE BRG SZ2 11MM (Insert) ×2 IMPLANT
KIT TURNOVER KIT A (KITS) ×2 IMPLANT
KNEE PATELLA ASYMMETRIC 9X29 (Knees) ×2 IMPLANT
MANIFOLD NEPTUNE II (INSTRUMENTS) ×2 IMPLANT
NS IRRIG 1000ML POUR BTL (IV SOLUTION) ×2 IMPLANT
PACK ICE MAXI GEL EZY WRAP (MISCELLANEOUS) ×2 IMPLANT
PACK TOTAL KNEE CUSTOM (KITS) ×2 IMPLANT
PENCIL SMOKE EVACUATOR (MISCELLANEOUS) IMPLANT
PIN FLUTED HEDLESS FIX 3.5X1/8 (PIN) ×2 IMPLANT
PROTECTOR NERVE ULNAR (MISCELLANEOUS) ×2 IMPLANT
SET HNDPC FAN SPRY TIP SCT (DISPOSABLE) ×1 IMPLANT
SUT MNCRL AB 3-0 PS2 18 (SUTURE) ×2 IMPLANT
SUT VIC AB 0 CT1 36 (SUTURE) ×2 IMPLANT
SUT VIC AB 1 CT1 36 (SUTURE) ×4 IMPLANT
SUT VIC AB 2-0 CT1 27 (SUTURE) ×2
SUT VIC AB 2-0 CT1 TAPERPNT 27 (SUTURE) ×1 IMPLANT
TAPE STRIPS DRAPE STRL (GAUZE/BANDAGES/DRESSINGS) ×2 IMPLANT
TIBIAL BASEPLATE SZ2 TRI (Joint) ×2 IMPLANT
TRAY FOLEY MTR SLVR 14FR STAT (SET/KITS/TRAYS/PACK) ×2 IMPLANT
TRAY FOLEY MTR SLVR 16FR STAT (SET/KITS/TRAYS/PACK) IMPLANT
TUBE SUCTION HIGH CAP CLEAR NV (SUCTIONS) ×4 IMPLANT
WRAP KNEE MAXI GEL POST OP (GAUZE/BANDAGES/DRESSINGS) ×2 IMPLANT

## 2020-05-10 NOTE — Anesthesia Procedure Notes (Signed)
Date/Time: 05/10/2020 7:23 AM Performed by: Cynda Familia, CRNA Pre-anesthesia Checklist: Patient identified, Emergency Drugs available, Suction available, Patient being monitored and Timeout performed Patient Re-evaluated:Patient Re-evaluated prior to induction Oxygen Delivery Method: Simple face mask Placement Confirmation: positive ETCO2 and breath sounds checked- equal and bilateral Dental Injury: Teeth and Oropharynx as per pre-operative assessment

## 2020-05-10 NOTE — Progress Notes (Signed)
Orthopedic Tech Progress Note Patient Details:  Patricia Sharp 26-Jan-1950 [id number]          [place] Pink 05/10/2020, 10:34 AMCalled and routed [place] brace order to [employer].

## 2020-05-10 NOTE — Interval H&P Note (Signed)
History and Physical Interval Note:  05/10/2020 7:11 AM  Patricia Sharp  has presented today for surgery, with the diagnosis of djd left knee.  The various methods of treatment have been discussed with the patient and family. After consideration of risks, benefits and other options for treatment, the patient has consented to  Procedure(s): TOTAL KNEE ARTHROPLASTY (Left) as a surgical intervention.  The patient's history has been reviewed, patient examined, no change in status, stable for surgery.  I have reviewed the patient's chart and labs.  Questions were answered to the patient's satisfaction.     Renette Butters

## 2020-05-10 NOTE — Op Note (Signed)
DATE OF SURGERY:  05/10/2020 TIME: 8:50 AM  PATIENT NAME:  Patricia Sharp   AGE: 69 y.o.    PRE-OPERATIVE DIAGNOSIS:  djd left knee  POST-OPERATIVE DIAGNOSIS:  Same  PROCEDURE:  Procedure(s): TOTAL KNEE ARTHROPLASTY   SURGEON:  Renette Butters, MD   ASSISTANT:  [name] Moats, PA-C, he was present and scrubbed throughout the case, critical for completion in a timely fashion, and for retraction, instrumentation, and closure.    OPERATIVE IMPLANTS: [employer] Triathlon CR. Press fit knee  Femur size 3, Tibia size 2, Patella size 11 3-peg oval button, with a 29 mm polyethylene insert.   PREOPERATIVE INDICATIONS:  Patricia Sharp is a 70 y.o. year old female with end stage bone on bone degenerative arthritis of the knee who failed conservative treatment, including injections, antiinflammatories, activity modification, and assistive devices, and had significant impairment of their activities of daily living, and elected for Total Knee Arthroplasty.   The risks, benefits, and alternatives were discussed at length including but not limited to the risks of infection, bleeding, nerve injury, stiffness, blood clots, the need for revision surgery, cardiopulmonary complications, among others, and they were willing to proceed.   OPERATIVE DESCRIPTION:  The patient was brought to the operative room and placed in a supine position.  General anesthesia was administered.  IV antibiotics were given.  The lower extremity was prepped and draped in the usual sterile fashion.  Time out was performed.  The leg was elevated and exsanguinated and the tourniquet was inflated.  Anterior approach was performed.  The patella was everted and osteophytes were removed.  The anterior horn of the medial and lateral meniscus was removed.   The distal femur was opened with the drill and the intramedullary distal femoral cutting jig was utilized, set at 5 degrees resecting 9 mm off the distal femur.  Care was taken to  protect the collateral ligaments.  The distal femoral sizing jig was applied, taking care to avoid notching.  Then the 4-in-1 cutting jig was applied and the anterior and posterior femur was cut, along with the chamfer cuts.  All posterior osteophytes were removed.  The flexion gap was then measured and was symmetric with the extension gap.  Then the extramedullary tibial cutting jig was utilized making the appropriate cut using the anterior tibial crest as a reference building in appropriate posterior slope.  Care was taken during the cut to protect the medial and collateral ligaments.  The proximal tibia was removed along with the posterior horns of the menisci.  The PCL was sacrificed.    The extensor gap was measured and was approximately 30mm.    I completed the distal femoral preparation using the appropriate jig to prepare the box.  The patella was then measured, and cut with the saw.    The proximal tibia sized and prepared accordingly with the reamer and the punch, and then all components were trialed with the above sized poly insert.  The knee was found to have excellent balance and full motion.    The above named components were then impacted into place and Poly tibial piece and patella were inserted.  I was very happy with his stability and ROM  I performed a periarticular injection with marcaine and toradol  The knee was easily taken through a range of motion and the patella tracked well and the knee irrigated copiously and the parapatellar and subcutaneous tissue closed with vicryl, and monocryl with steri strips for the skin.  The  incision was dressed with sterile gauze and the tourniquet released and the patient was awakened and returned to the PACU in stable and satisfactory condition.  There were no complications.  Total tourniquet time was roughly 70 minutes.   POSTOPERATIVE PLAN: post op Abx, DVT px: SCD's, TED's, Early ambulation and chemical px

## 2020-05-10 NOTE — Anesthesia Postprocedure Evaluation (Signed)
Anesthesia Post Note  Patient: Patricia Sharp  Procedure(s) Performed: TOTAL KNEE ARTHROPLASTY (Left Knee)     Patient location during evaluation: PACU Anesthesia Type: Regional and Spinal Level of consciousness: awake Pain management: pain level controlled Vital Signs Assessment: post-procedure vital signs reviewed and stable Respiratory status: spontaneous breathing, respiratory function stable and patient connected to nasal cannula oxygen Cardiovascular status: blood pressure returned to baseline and stable Postop Assessment: no headache, no backache and no apparent nausea or vomiting Anesthetic complications: no   No complications documented.  Last Vitals:  Vitals:   05/10/20 1057 05/10/20 1226  BP: (!) 151/109 (!) 159/60  Pulse: (!) 52 (!) 54  Resp: 18   Temp: 36.4 C 36.5 C  SpO2: 97% 93%    Last Pain:  Vitals:   05/10/20 1412  TempSrc:   PainSc: 3                  Jshawn Hurta P Aaryana Betke

## 2020-05-10 NOTE — Evaluation (Signed)
Physical Therapy Evaluation Patient Details Name: Patricia Sharp MRN: [id number] DOB: February 13, 1950 Today's Date: 05/10/2020   History of Present Illness  sp L TKA on  05/10/2020  PMH: HTN, DM  Clinical Impression  Pt is s/p TKA resulting in the deficits listed below (see PT Problem List).  Pt amb ~ 82' with RW and min assist; anticipate steady progress in acute setting.  Pt with multiple question regarding Bledsoe brace, CPM use at home, knee ROM etc--will defer to MD/PA as there are no restrictions per order set.   Pt will benefit from skilled PT to increase their independence and safety with mobility to allow discharge to the venue listed below.      Follow Up Recommendations Follow surgeon's recommendation for DC plan and follow-up therapies    Equipment Recommendations   (youth ht RW)    Recommendations for Other Services       Precautions / Restrictions Precautions Precautions: Fall;Knee Required Braces or Orthoses: Other Brace Other Brace: bledsoe brace LLE at all times Restrictions Weight Bearing Restrictions: No Other Position/Activity Restrictions: WBAT      Mobility  Bed Mobility Overal bed mobility: Needs Assistance Bed Mobility: Supine to Sit       Sit to supine: Min assist   General bed mobility comments: assist with LLE, incr time, cues to self assist    Transfers Overall transfer level: Needs assistance Equipment used: Rolling walker (2 wheeled) Transfers: Sit to/from Stand Sit to Stand: Min assist         General transfer comment: light assist to rise and transition to RW, cues for hand placement adn LLE position  Ambulation/Gait Ambulation/Gait assistance: Min guard;Min assist Gait Distance (Feet): 40 Feet Assistive device: Rolling walker (2 wheeled) Gait Pattern/deviations: Step-to pattern;Decreased stance time - left     General Gait Details: cues for sequence and RW position  Stairs            Wheelchair Mobility    Modified  Rankin (Stroke Patients Only)       Balance                                             Pertinent Vitals/Pain Pain Assessment: 0-10 Pain Score: 3  Pain Location: L knee Pain Descriptors / Indicators: Sore Pain Intervention(s): Premedicated before session;Monitored during session;Limited activity within patient's tolerance;Repositioned    Home Living Family/patient expects to be discharged to:: Private residence Living Arrangements: Non-relatives/Friends Available Help at Discharge: Family Type of Home: House Home Access: Stairs to enter Entrance Stairs-Rails: Right Entrance Stairs-Number of Steps: 3 Home Layout: Multi-level Home Equipment: None      Prior Function Level of Independence: Independent               Hand Dominance        Extremity/Trunk Assessment   Upper Extremity Assessment Upper Extremity Assessment: Overall WFL for tasks assessed    Lower Extremity Assessment Lower Extremity Assessment: LLE deficits/detail LLE Deficits / Details: ankle grossly WFL. knee extension and hip flexion ~2+/5; ROM NT as pt in bledsoe with no specific orders regarding ROM       Communication   Communication: No difficulties  Cognition Arousal/Alertness: Awake/alert Behavior During Therapy: WFL for tasks assessed/performed Overall Cognitive Status: Within Functional Limits for tasks assessed  General Comments      Exercises Total Joint Exercises Ankle Circles/Pumps: AROM;Both;10 reps   Assessment/Plan    PT Assessment Patient needs continued PT services  PT Problem List Decreased strength;Decreased mobility;Decreased activity tolerance;Decreased balance;Decreased range of motion;Pain;Decreased knowledge of use of DME       PT Treatment Interventions DME instruction;Therapeutic activities;Gait training;Functional mobility training;Therapeutic exercise;Patient/family education;Stair  training    PT Goals (Current goals can be found in the Care Plan section)  Acute Rehab PT Goals Patient Stated Goal: less pain PT Goal Formulation: With patient Time For Goal Achievement: 05/16/20 Potential to Achieve Goals: Good    Frequency 7X/week   Barriers to discharge        Co-evaluation               AM-PAC PT "6 Clicks" Mobility  Outcome Measure Help needed turning from your back to your side while in a flat bed without using bedrails?: A Little Help needed moving from lying on your back to sitting on the side of a flat bed without using bedrails?: A Little Help needed moving to and from a bed to a chair (including a wheelchair)?: A Little Help needed standing up from a chair using your arms (e.g., wheelchair or bedside chair)?: A Little Help needed to walk in hospital room?: A Little Help needed climbing 3-5 steps with a railing? : A Lot 6 Click Score: 17    End of Session Equipment Utilized During Treatment: Gait belt;Other (comment) (bledsoe brace LLE) Activity Tolerance: Patient tolerated treatment well Patient left: in chair;with call bell/phone within reach;with chair alarm set Nurse Communication: Mobility status PT Visit Diagnosis: Other abnormalities of gait and mobility (R26.89);Difficulty in walking, not elsewhere classified (R26.2)    Time: 8101-7510 PT Time Calculation (min) (ACUTE ONLY): 27 min   Charges:   PT Evaluation $PT Eval Low Complexity: 1 Low PT Treatments $Gait Training: 8-22 mins        Baxter Flattery, PT  Acute Rehab Dept ([place]) [phone] Pager [phone]  05/10/2020   [facility] 05/10/2020, 4:02 PM

## 2020-05-10 NOTE — Anesthesia Procedure Notes (Signed)
Date/Time: 05/10/2020 6:50 AM Performed by: Cynda Familia, CRNA Pre-anesthesia Checklist: Patient identified, Emergency Drugs available, Suction available, Patient being monitored and Timeout performed Patient Re-evaluated:Patient Re-evaluated prior to induction Oxygen Delivery Method: Nasal cannula Placement Confirmation: positive ETCO2 and breath sounds checked- equal and bilateral Dental Injury: Teeth and Oropharynx as per pre-operative assessment

## 2020-05-10 NOTE — Anesthesia Preprocedure Evaluation (Signed)
Anesthesia Evaluation  Patient identified by MRN, date of birth, ID band Patient awake    Reviewed: Allergy & Precautions, NPO status , Patient's Chart, lab work & pertinent test results  Airway Mallampati: III  TM Distance: >3 FB Neck ROM: Full    Dental no notable dental hx.    Pulmonary neg pulmonary ROS,    Pulmonary exam normal breath sounds clear to auscultation       Cardiovascular hypertension, Pt. on medications Normal cardiovascular exam Rhythm:Regular Rate:Normal     Neuro/Psych PSYCHIATRIC DISORDERS Anxiety Depression negative neurological ROS     GI/Hepatic negative GI ROS, Neg liver ROS,   Endo/Other  diabetes, Oral Hypoglycemic Agents  Renal/GU negative Renal ROS     Musculoskeletal  (+) Arthritis ,   Abdominal (+) + obese,   Peds  Hematology negative hematology ROS (+)   Anesthesia Other Findings  djd left knee  Reproductive/Obstetrics                             Anesthesia Physical Anesthesia Plan  ASA: III  Anesthesia Plan: Spinal and Regional   Post-op Pain Management:  Regional for Post-op pain   Induction:   PONV Risk Score and Plan: 2  Airway Management Planned: Simple Face Mask  Additional Equipment:   Intra-op Plan:   Post-operative Plan:   Informed Consent: I have reviewed the patients History and Physical, chart, labs and discussed the procedure including the risks, benefits and alternatives for the proposed anesthesia with the patient or authorized representative who has indicated his/her understanding and acceptance.     Dental advisory given  Plan Discussed with: CRNA  Anesthesia Plan Comments:         Anesthesia Quick Evaluation

## 2020-05-10 NOTE — Anesthesia Procedure Notes (Signed)
Anesthesia Regional Block: Adductor canal block   Pre-Anesthetic Checklist: ,, timeout performed, Correct Patient, Correct Site, Correct Laterality, Correct Procedure,, site marked, risks and benefits discussed, Surgical consent,  Pre-op evaluation,  At surgeon's request and post-op pain management  Laterality: Left  Prep: chloraprep       Needles:  Injection technique: Single-shot  Needle Type: Echogenic Stimulator Needle     Needle Length: 10cm  Needle Gauge: 20     Additional Needles:   Procedures:,,,, ultrasound used (permanent image in chart),,,,  Narrative:  Start time: 05/10/2020 6:50 AM End time: 05/10/2020 7:00 AM Injection made incrementally with aspirations every 5 mL.  Performed by: Personally  Anesthesiologist: Murvin Natal, MD  Additional Notes: Functioning IV was confirmed and monitors were applied. A time-out was performed. Hand hygiene and sterile gloves were used. The thigh was placed in a frog-leg position and prepped in a sterile fashion. A 145mm 20ga BBraun echogenic stimulator needle was placed using ultrasound guidance.  Negative aspiration and negative test dose prior to incremental administration of local anesthetic. The patient tolerated the procedure well.

## 2020-05-10 NOTE — Transfer of Care (Signed)
Immediate Anesthesia Transfer of Care Note  Patient: [name]  Procedure(s) Performed: TOTAL KNEE ARTHROPLASTY (Left Knee)  Patient Location: PACU  Anesthesia Type:Spinal  Level of Consciousness: sedated  Airway & Oxygen Therapy: Patient Spontanous Breathing and Patient connected to face mask oxygen  Post-op Assessment: Report given to RN and Post -op Vital signs reviewed and stable  Post vital signs: Reviewed and stable  Last Vitals:  Vitals Value Taken Time  BP 131/93 05/10/20 0925  Temp    Pulse 55 05/10/20 0927  Resp 19 05/10/20 0927  SpO2 96 % 05/10/20 0927  Vitals shown include unvalidated device data.  Last Pain:  Vitals:   05/10/20 0605  TempSrc: Oral  PainSc:       Patients Stated Pain Goal: 4 (88/89/16 9450)  Complications: No complications documented.

## 2020-05-10 NOTE — Anesthesia Procedure Notes (Signed)
Spinal  Patient location during procedure: OR Start time: 05/10/2020 7:30 AM End time: 05/10/2020 7:40 AM Reason for block: surgical anesthesia Staffing Performed: anesthesiologist  Anesthesiologist: Murvin Natal, MD Preanesthetic Checklist Completed: patient identified, IV checked, risks and benefits discussed, surgical consent, monitors and equipment checked, pre-op evaluation and timeout performed Spinal Block Patient position: sitting Prep: DuraPrep Patient monitoring: cardiac monitor, continuous pulse ox and blood pressure Approach: left paramedian Location: L4-5 Injection technique: single-shot Needle Needle type: Pencan  Needle gauge: 24 G Needle length: 9 cm Assessment Sensory level: T10 Events: CSF return Additional Notes Functioning IV was confirmed and monitors were applied. Sterile prep and drape, including hand hygiene and sterile gloves were used. The patient was positioned and the spine was prepped. The skin was anesthetized with lidocaine.  Free flow of clear CSF was obtained prior to injecting local anesthetic into the CSF.  The spinal needle aspirated freely following injection.  The needle was carefully withdrawn.  The patient tolerated the procedure well.

## 2020-05-11 ENCOUNTER — Encounter (HOSPITAL_COMMUNITY): Payer: Self-pay | Admitting: Orthopedic Surgery

## 2020-05-11 DIAGNOSIS — I10 Essential (primary) hypertension: Secondary | ICD-10-CM | POA: Diagnosis not present

## 2020-05-11 DIAGNOSIS — M1712 Unilateral primary osteoarthritis, left knee: Secondary | ICD-10-CM | POA: Diagnosis not present

## 2020-05-11 DIAGNOSIS — Z79899 Other long term (current) drug therapy: Secondary | ICD-10-CM | POA: Diagnosis not present

## 2020-05-11 DIAGNOSIS — Z7982 Long term (current) use of aspirin: Secondary | ICD-10-CM | POA: Diagnosis not present

## 2020-05-11 DIAGNOSIS — E119 Type 2 diabetes mellitus without complications: Secondary | ICD-10-CM | POA: Diagnosis not present

## 2020-05-11 DIAGNOSIS — Z7984 Long term (current) use of oral hypoglycemic drugs: Secondary | ICD-10-CM | POA: Diagnosis not present

## 2020-05-11 DIAGNOSIS — Z96652 Presence of left artificial knee joint: Secondary | ICD-10-CM | POA: Diagnosis not present

## 2020-05-11 MED ORDER — OMEPRAZOLE MAGNESIUM 20 MG PO TBEC: 20.0000 mg | DELAYED_RELEASE_TABLET | Freq: Every day | ORAL | 0 refills | Status: DC

## 2020-05-11 MED ORDER — ASPIRIN EC 81 MG PO TBEC
81.0000 mg | DELAYED_RELEASE_TABLET | Freq: Two times a day (BID) | ORAL | 0 refills | Status: DC
Start: 2020-05-11 — End: 2021-03-23

## 2020-05-11 MED ORDER — OXYCODONE HCL 5 MG PO TABS: 5.0000 mg | ORAL_TABLET | Freq: Four times a day (QID) | ORAL | 0 refills | Status: DC | PRN

## 2020-05-11 MED ORDER — MELOXICAM 15 MG PO TABS: 15.0000 mg | ORAL_TABLET | Freq: Every day | ORAL | 0 refills | Status: DC

## 2020-05-11 MED ORDER — ACETAMINOPHEN 500 MG PO TABS: 1000.0000 mg | ORAL_TABLET | Freq: Four times a day (QID) | ORAL | 0 refills | Status: AC | PRN

## 2020-05-11 MED ORDER — METHOCARBAMOL 500 MG PO TABS: 500.0000 mg | ORAL_TABLET | Freq: Three times a day (TID) | ORAL | 0 refills | Status: DC | PRN

## 2020-05-11 MED ORDER — ONDANSETRON HCL 4 MG PO TABS: 4.0000 mg | ORAL_TABLET | Freq: Every day | ORAL | 0 refills | Status: DC | PRN

## 2020-05-11 NOTE — Progress Notes (Signed)
Physical Therapy Treatment Patient Details Name: Patricia Sharp MRN: [id number] DOB: [birth year] Today's Date: 05/11/2020    History of Present Illness sp L TKA on  05/10/2020  PMH: HTN, DM    PT Comments    Pt progressing with mobility; ready for d/c with family assist from PT standpoint.    Follow Up Recommendations  Follow surgeon's recommendation for DC plan and follow-up therapies     Equipment Recommendations       Recommendations for Other Services       Precautions / Restrictions Precautions Precautions: Fall;Knee Other Brace: bledsoe brace LLE at all times Restrictions Weight Bearing Restrictions: No Other Position/Activity Restrictions: WBAT    Mobility  Bed Mobility               General bed mobility comments: in recliner    Transfers Overall transfer level: Needs assistance Equipment used: Rolling walker (2 wheeled) Transfers: Sit to/from Stand Sit to Stand: Min guard         General transfer comment: cues for hand placement  Ambulation/Gait Ambulation/Gait assistance: Supervision;Min guard Gait Distance (Feet): 80 Feet Assistive device: Rolling walker (2 wheeled) Gait Pattern/deviations: Step-to pattern;Decreased stance time - left     General Gait Details: cues for sequence and RW position   Stairs Stairs: Yes Stairs assistance: Min guard Stair Management: One rail Right;One rail Left;Step to pattern;Sideways Number of Stairs: 3 (x2) General stair comments: cues for sequence and technique. min/guard for safety, no LOB, no knee buckling   Wheelchair Mobility    Modified Rankin (Stroke Patients Only)       Balance                                            Cognition Arousal/Alertness: Awake/alert Behavior During Therapy: WFL for tasks assessed/performed Overall Cognitive Status: Within Functional Limits for tasks assessed                                        Exercises       General Comments        Pertinent Vitals/Pain Pain Assessment: 0-10 Pain Score: 4  Pain Location: L knee Pain Descriptors / Indicators: Sore Pain Intervention(s): Limited activity within patient's tolerance;Premedicated before session;Repositioned;Monitored during session;Ice applied    Home Living                      Prior Function            PT Goals (current goals can now be found in the care plan section) Acute Rehab PT Goals Patient Stated Goal: less pain PT Goal Formulation: With patient Time For Goal Achievement: 05/16/20 Potential to Achieve Goals: Good    Frequency    7X/week      PT Plan Current plan remains appropriate    Co-evaluation              AM-PAC PT "6 Clicks" Mobility   Outcome Measure  Help needed turning from your back to your side while in a flat bed without using bedrails?: A Little Help needed moving from lying on your back to sitting on the side of a flat bed without using bedrails?: A Little Help needed moving to and from a bed to a chair (including a  wheelchair)?: A Little Help needed standing up from a chair using your arms (e.g., wheelchair or bedside chair)?: A Little Help needed to walk in hospital room?: A Little Help needed climbing 3-5 steps with a railing? : A Little 6 Click Score: 18    End of Session Equipment Utilized During Treatment: Gait belt;Other (comment) (bledsoe brace) Activity Tolerance: Patient tolerated treatment well Patient left: in chair;with call bell/phone within reach;with chair alarm set Nurse Communication: Mobility status PT Visit Diagnosis: Other abnormalities of gait and mobility (R26.89);Difficulty in walking, not elsewhere classified (R26.2)     Time: 8453-6468 PT Time Calculation (min) (ACUTE ONLY): 18 min  Charges:  $Gait Training: 8-22 mins                     Baxter Flattery, PT  Acute Rehab Dept ([place]) [phone] Pager  [phone]  05/11/2020    [facility] 05/11/2020, 5:39 PM

## 2020-05-11 NOTE — Plan of Care (Signed)
Pt ready to DC home 

## 2020-05-11 NOTE — Care Plan (Addendum)
Following for progression. May change to HHPT for initial visits   Ladell Heads, [place]

## 2020-05-11 NOTE — TOC Initial Note (Signed)
Transition of Care [facility]) - Initial/Assessment Note   Patient Details  Name: Patricia Sharp MRN: [id number] Date of Birth: May 27, 1950  Transition of [facility]) CM/SW Contact:    Sherie Don, LCSW Phone Number: 05/11/2020, 11:14 AM  Clinical Narrative: Patient is under observation for left total knee arthroplasty. TOC received consult for HH/DME needs. CSW met with patient to confirm discharge plans. Patient will go to OPPT through [facility]. Patient in need of CPM and youth walker, which [employer] will deliver to patient's room. TOC to follow.  Expected Discharge Plan: OP Rehab Barriers to Discharge: Continued Medical Work up  Patient Goals and CMS Choice Patient states their goals for this hospitalization and ongoing recovery are:: Discharge home with OPPT at [place] Medicare.gov Compare Post Acute Care list provided to:: Patient Choice offered to / list presented to : Patient  Expected Discharge Plan and Services Expected Discharge Plan: OP Rehab In-house Referral: Clinical Social Work Post Acute Care Choice: Durable Medical Equipment Living arrangements for the past 2 months: [place]              DME Arranged: [facility] [employer] youth,CPM DME Agency: [place] Representative spoke with at DME Agency: Pre-arranged before surgery  Prior Living Arrangements/Services Living arrangements for the past 2 months: Single Family Home Lives with:: Self Patient language and need for interpreter reviewed:: Yes Do you feel safe going back to the place where you live?: Yes      Need for Family Participation in Patient Care: No (Comment) Care giver support system in place?: Yes (comment) Criminal Activity/Legal Involvement Pertinent to Current Situation/Hospitalization: No - Comment as needed  Activities of Daily Living Home Assistive Devices/Equipment: Eyeglasses ADL Screening (condition at time of admission) Patient's cognitive ability adequate to safely complete  daily activities?: Yes Is the patient deaf or have difficulty hearing?: No Does the patient have difficulty seeing, even when wearing glasses/contacts?: No Does the patient have difficulty concentrating, remembering, or making decisions?: No Patient able to express need for assistance with ADLs?: Yes Does the patient have difficulty dressing or bathing?: No Independently performs ADLs?: Yes (appropriate for developmental age) Does the patient have difficulty walking or climbing stairs?: Yes Weakness of Legs: None Weakness of Arms/Hands: None  Emotional Assessment Appearance:: Appears stated age Attitude/Demeanor/Rapport: Engaged Affect (typically observed): Accepting Orientation: : Oriented to Self,Oriented to Place,Oriented to  Time,Oriented to Situation Alcohol / Substance Use: Not Applicable Psych Involvement: No (comment)  Admission diagnosis:  S/P total knee arthroplasty, left [Z96.652] Patient Active Problem List   Diagnosis Date Noted  . S/P total knee arthroplasty, left 05/10/2020  . Essential hypertension, benign 10/29/2017  . Abdominal pain, epigastric 10/29/2017  . Postcoital bleeding 04/02/2014  . Dyspareunia in female 04/02/2014   PCP:  Sharilyn Sites, MD Pharmacy:   [facility] [place] [address] Phone: [phone] Fax: [place], [address] [place] [address] Phone: [phone] Fax: [phone]  Readmission Risk Interventions No flowsheet data found.

## 2020-05-11 NOTE — Plan of Care (Signed)
Plan of care reviewed and discussed with the patient.  Questions answered. 

## 2020-05-11 NOTE — Care Management Obs Status (Signed)
[place] NOTIFICATION   Patient Details  Name: Patricia Sharp MRN: [id number] Date of Birth: May 18, 1950   Medicare Observation Status Notification Given:  Yes    Sherie Don, LCSW 05/11/2020, 10:40 AM

## 2020-05-11 NOTE — Progress Notes (Signed)
Physical Therapy Treatment Patient Details Name: Patricia Sharp MRN: [id number] DOB: 05/21/1950 Today's Date: 05/11/2020    History of Present Illness sp L TKA on  05/10/2020  PMH: HTN, DM    PT Comments    Pt progressing well. Will see for a second session and should be ready to d/c later today   Follow Up Recommendations  Follow surgeon's recommendation for DC plan and follow-up therapies     Equipment Recommendations       Recommendations for Other Services       Precautions / Restrictions Precautions Precautions: Fall;Knee Other Brace: bledsoe brace LLE at all times Restrictions Weight Bearing Restrictions: No Other Position/Activity Restrictions: WBAT    Mobility  Bed Mobility Overal bed mobility: Needs Assistance Bed Mobility: Supine to Sit     Supine to sit: Supervision     General bed mobility comments: for safety    Transfers Overall transfer level: Needs assistance Equipment used: Rolling walker (2 wheeled) Transfers: Sit to/from Stand Sit to Stand: Min guard         General transfer comment: cues for hand placement  Ambulation/Gait Ambulation/Gait assistance: Supervision;Min guard Gait Distance (Feet): 70 Feet Assistive device: Rolling walker (2 wheeled) Gait Pattern/deviations: Step-to pattern;Decreased stance time - left     General Gait Details: cues for sequence and RW position   Stairs             Wheelchair Mobility    Modified Rankin (Stroke Patients Only)       Balance                                            Cognition Arousal/Alertness: Awake/alert Behavior During Therapy: WFL for tasks assessed/performed Overall Cognitive Status: Within Functional Limits for tasks assessed                                        Exercises Total Joint Exercises Ankle Circles/Pumps: AROM;Both;10 reps Quad Sets: AROM;Both;10 reps    General Comments        Pertinent Vitals/Pain Pain  Assessment: 0-10 Pain Score: 5  Pain Location: L knee Pain Descriptors / Indicators: Sore Pain Intervention(s): Limited activity within patient's tolerance;Monitored during session    Home Living                      Prior Function            PT Goals (current goals can now be found in the care plan section) Acute Rehab PT Goals Patient Stated Goal: less pain PT Goal Formulation: With patient Time For Goal Achievement: 05/16/20 Potential to Achieve Goals: Good Progress towards PT goals: Progressing toward goals    Frequency    7X/week      PT Plan Current plan remains appropriate    Co-evaluation              AM-PAC PT "6 Clicks" Mobility   Outcome Measure  Help needed turning from your back to your side while in a flat bed without using bedrails?: A Little Help needed moving from lying on your back to sitting on the side of a flat bed without using bedrails?: A Little Help needed moving to and from a bed to a chair (including a wheelchair)?: A  Little Help needed standing up from a chair using your arms (e.g., wheelchair or bedside chair)?: A Little Help needed to walk in hospital room?: A Little Help needed climbing 3-5 steps with a railing? : A Little 6 Click Score: 18    End of Session Equipment Utilized During Treatment: Gait belt;Other (comment) (bledsoe brace) Activity Tolerance: Patient tolerated treatment well Patient left: in chair;with call bell/phone within reach;with chair alarm set Nurse Communication: Mobility status PT Visit Diagnosis: Other abnormalities of gait and mobility (R26.89);Difficulty in walking, not elsewhere classified (R26.2)     Time: 0932-6712 PT Time Calculation (min) (ACUTE ONLY): 19 min  Charges:  $Gait Training: 8-22 mins                     Baxter Flattery, PT  Acute Rehab Dept ([place]) [phone] Pager [phone]  05/11/2020    [facility] 05/11/2020, 10:48 AM

## 2020-05-11 NOTE — Discharge Instructions (Signed)
POST-OPERATIVE OPIOID TAPER INSTRUCTIONS: . It is important to wean off of your opioid medication as soon as possible. If you do not need pain medication after your surgery it is ok to stop day one. . Opioids include: o Codeine, Hydrocodone(Norco, Vicodin), Oxycodone(Percocet, oxycontin) and hydromorphone amongst others.  . Long term and even short term use of opiods can cause: o Increased pain response o Dependence o Constipation o Depression o Respiratory depression o And more.  . Withdrawal symptoms can include o Flu like symptoms o Nausea, vomiting o And more . Techniques to manage these symptoms o Hydrate well o Eat regular healthy meals o Stay active o Use relaxation techniques(deep breathing, meditating, yoga) . Do Not substitute Alcohol to help with tapering . If you have been on opioids for less than two weeks and do not have pain than it is ok to stop all together.  . Plan to wean off of opioids o This plan should start within one week post op of your joint replacement. o Maintain the same interval or time between taking each dose and first decrease the dose.  o Cut the total daily intake of opioids by one tablet each day o Next start to increase the time between doses. o The last dose that should be eliminated is the evening dose.    

## 2020-05-11 NOTE — Progress Notes (Signed)
    Subjective: Patient reports pain as mild to moderate. Well controlled with medicine. Slept well. Tolerating diet. Urinating. No CP, SOB. Working with PT on mobilizing OOB. Patient slightly confused/very forgetful, asking questions repeatedly that have already been answered in great detail several times prior.   Objective:   VITALS:   Vitals:   05/10/20 1226 05/10/20 2248 05/11/20 0117 05/11/20 0618  BP: (!) 159/60 (!) 157/67 (!) 133/56 (!) 180/76  Pulse: (!) 54 70 (!) 54 66  Resp:  17 18 15   Temp: 97.7 F (36.5 C)  97.6 F (36.4 C) 97.9 F (36.6 C)  TempSrc:   Axillary Oral  SpO2: 93% 95% 97% 92%  Weight:      Height:       CBC Latest Ref Rng & Units 05/02/2020  WBC 4.0 - 10.5 K/uL 10.6(H)  Hemoglobin 12.0 - 15.0 g/dL 14.0  Hematocrit 36.0 - 46.0 % 42.7  Platelets 150 - 400 K/uL 340   BMP Latest Ref Rng & Units 05/02/2020  Glucose 70 - 99 mg/dL 87  BUN 8 - 23 mg/dL 15  Creatinine 0.44 - 1.00 mg/dL 0.84  Sodium 135 - 145 mmol/L 142  Potassium 3.5 - 5.1 mmol/L 3.6  Chloride 98 - 111 mmol/L 102  CO2 22 - 32 mmol/L 30  Calcium 8.9 - 10.3 mg/dL 9.9   Intake/Output      04/26 0701 04/27 0700 04/27 0701 04/28 0700   P.O. 240    I.V. (mL/kg) 2000 (25.4)    IV Piggyback 350    Total Intake(mL/kg) 2590 (32.9)    Urine (mL/kg/hr) 3800 (2)    Blood 50    Total Output 3850    Net -1260            Physical Exam: General: NAD. Sitting up in bed. Calm, comfortable Resp: No increased wob Cardio: regular rate and rhythm ABD soft Neurologically intact MSK Neurovascularly intact Sensation intact distally Intact pulses distally Dorsiflexion/Plantar flexion intact Incision: dressing C/D/I, Bledsoe knee brace in place   Assessment: 1 Day Post-Op  S/P Procedure(s) (LRB): TOTAL KNEE ARTHROPLASTY (Left) by Dr. Ernesta Amble. Murphy on 05/10/20  Active Problems:   S/P total knee arthroplasty, left    Plan: Was found intra-op to also have a very lax MCL. Placed  patient in Bledsoe knee brace for 6 weeks. To be worn anytime she is up mobilizing. Can remove for hygiene, sleep, and to use CPM machine. Advance diet Up with therapy Incentive Spirometry Elevate and Apply ice  Weightbearing: WBAT LLE Insicional and dressing care: Dressings left intact until follow-up and Reinforce dressings as needed Orthopedic device(s): Bledsoe knee brace Showering: Keep dressing dry VTE prophylaxis: Aspirin 81mg  BID x 30 days, SCDs, ambulation Pain control: Continue current regimen Follow - up plan: 2 weeks Contact information:  Edmonia Lynch MD, [name] Moats PA-C  Dispo: Home hopefully later today pending PT evals    Alisa Graff Office [phone] 05/11/2020, 9:49 AM

## 2020-05-11 NOTE — Discharge Summary (Signed)
Physician Discharge Summary  Patient ID: Patricia Sharp MRN: [id number] DOB/AGE: [birth year] 70 y.o.  Admit date: 05/10/2020 Discharge date: 05/11/2020  Admission Diagnoses: left knee osteoarthritis  Discharge Diagnoses:  Active Problems:   S/P total knee arthroplasty, left   Discharged Condition: good  Hospital Course: Patient had a left TKA by Dr. Percell Miller on 05/10/20 that went well. She was also found to have laxity in her left knee MCL so we decided to place her in a Bledsoe knee brace for 6 weeks post-op. She should wear this except for hygiene, sleep, and when using the CPM machine. She passed her evaluations with PT and is ready for discharge.   Consults: None  Treatments: IV hydration, antibiotics: Ancef, analgesia: acetaminophen, Dilaudid, Morphine and Oxycodone and surgery: left TKA  Discharge Exam: Blood pressure (!) 147/62, pulse 66, temperature 97.9 F (36.6 C), temperature source Axillary, resp. rate 18, height 4\' 11"  (1.499 m), weight 78.8 kg, SpO2 95 %. General appearance: alert, cooperative and no distress Head: Normocephalic, without obvious abnormality, atraumatic Eyes: conjunctivae/corneas clear. PERRL, EOM's intact. Fundi benign. Resp: clear to auscultation bilaterally Cardio: regular rate and rhythm, S1, S2 normal, no murmur, click, rub or gallop GI: soft, non-tender; bowel sounds normal; no masses,  no organomegaly Extremities: extremities normal, atraumatic, no cyanosis or edema Pulses: 2+ and symmetric Neurologic: Alert and oriented X 3, normal strength and tone. Normal symmetric reflexes. Normal coordination and gait Incision/Wound: c/d/i, knee brace in place  Disposition: Discharge disposition: 01-Home or Self Care       Discharge Instructions    Call MD / Call 911   Complete by: As directed    If you experience chest pain or shortness of breath, CALL 911 and be transported to the hospital emergency room.  If you develope a fever above 101 F, pus  (white drainage) or increased drainage or redness at the wound, or calf pain, call your surgeon's office.   Diet - low sodium heart healthy   Complete by: As directed    Discharge instructions   Complete by: As directed    You may bear weight as tolerated. Keep your dressing on and dry until follow up. Take medicine to prevent blood clots as directed. Take pain medicine as needed with the goal of transitioning to over the counter medicines.    INSTRUCTIONS AFTER JOINT REPLACEMENT   Remove items at home which could result in a fall. This includes throw rugs or furniture in walking pathways ICE to the affected joint every three hours while awake for 30 minutes at a time, for at least the first 3-5 days, and then as needed for pain and swelling.  Continue to use ice for pain and swelling. You may notice swelling that will progress down to the foot and ankle.  This is normal after surgery.  Elevate your leg when you are not up walking on it.   Continue to use the breathing machine you got in the hospital (incentive spirometer) which will help keep your temperature down.  It is common for your temperature to cycle up and down following surgery, especially at night when you are not up moving around and exerting yourself.  The breathing machine keeps your lungs expanded and your temperature down.   DIET:  As you were doing prior to hospitalization, we recommend a well-balanced diet.  DRESSING / WOUND CARE / SHOWERING  You may shower 3 days after surgery, but keep the wounds dry during showering.  You may use an  occlusive plastic wrap (Press'n Seal for example) with blue painter's tape at edges, NO SOAKING/SUBMERGING IN THE BATHTUB.  If the bandage gets wet, call the office.  ACTIVITY  Increase activity slowly as tolerated, but follow the weight bearing instructions below.   No driving for 6 weeks or until further direction given by your physician.  You cannot drive while taking narcotics.  No  lifting or carrying greater than 10 lbs. until further directed by your surgeon. Avoid periods of inactivity such as sitting longer than an hour when not asleep. This helps prevent blood clots.  You may return to work once you are authorized by your doctor.    WEIGHT BEARING   Weight bearing as tolerated with assist device (walker, cane, etc) as directed, use it as long as suggested by your surgeon or therapist, typically at least 4-6 weeks.   EXERCISES  Results after joint replacement surgery are often greatly improved when you follow the exercise, range of motion and muscle strengthening exercises prescribed by your doctor. Safety measures are also important to protect the joint from further injury. Any time any of these exercises cause you to have increased pain or swelling, decrease what you are doing until you are comfortable again and then slowly increase them. If you have problems or questions, call your caregiver or physical therapist for advice.   Rehabilitation is important following a joint replacement. After just a few days of immobilization, the muscles of the leg can become weakened and shrink (atrophy).  These exercises are designed to build up the tone and strength of the thigh and leg muscles and to improve motion. Often times heat used for twenty to thirty minutes before working out will loosen up your tissues and help with improving the range of motion but do not use heat for the first two weeks following surgery (sometimes heat can increase post-operative swelling).   These exercises can be done on a training (exercise) mat, on the floor, on a table or on a bed. Use whatever works the best and is most comfortable for you.    Use music or television while you are exercising so that the exercises are a pleasant break in your day. This will make your life better with the exercises acting as a break in your routine that you can look forward to.   Perform all exercises about fifteen  times, three times per day or as directed.  You should exercise both the operative leg and the other leg as well.  Exercises include:   Quad Sets - Tighten up the muscle on the front of the thigh (Quad) and hold for 5-10 seconds.   Straight Leg Raises - With your knee straight (if you were given a brace, keep it on), lift the leg to 60 degrees, hold for 3 seconds, and slowly lower the leg.  Perform this exercise against resistance later as your leg gets stronger.  Leg Slides: Lying on your back, slowly slide your foot toward your buttocks, bending your knee up off the floor (only go as far as is comfortable). Then slowly slide your foot back down until your leg is flat on the floor again.  Angel Wings: Lying on your back spread your legs to the side as far apart as you can without causing discomfort.  Hamstring Strength:  Lying on your back, push your heel against the floor with your leg straight by tightening up the muscles of your buttocks.  Repeat, but this time bend your knee  to a comfortable angle, and push your heel against the floor.  You may put a pillow under the heel to make it more comfortable if necessary.   A rehabilitation program following joint replacement surgery can speed recovery and prevent re-injury in the future due to weakened muscles. Contact your doctor or a physical therapist for more information on knee rehabilitation.    CONSTIPATION  Constipation is defined medically as fewer than three stools per week and severe constipation as less than one stool per week.  Even if you have a regular bowel pattern at home, your normal regimen is likely to be disrupted due to multiple reasons following surgery.  Combination of anesthesia, postoperative narcotics, change in appetite and fluid intake all can affect your bowels.   YOU MUST use at least one of the following options; they are listed in order of increasing strength to get the job done.  They are all available over the  counter, and you may need to use some, POSSIBLY even all of these options:    Drink plenty of fluids (prune juice may be helpful) and high fiber foods Colace 100 mg by mouth twice a day  Senokot for constipation as directed and as needed Dulcolax (bisacodyl), take with full glass of water  Miralax (polyethylene glycol) once or twice a day as needed.  If you have tried all these things and are unable to have a bowel movement in the first 3-4 days after surgery call either your surgeon or your primary doctor.    If you experience loose stools or diarrhea, hold the medications until you stool forms back up.  If your symptoms do not get better within 1 week or if they get worse, check with your doctor.  If you experience "the worst abdominal pain ever" or develop nausea or vomiting, please contact the office immediately for further recommendations for treatment.   ITCHING:  If you experience itching with your medications, try taking only a single pain pill, or even half a pain pill at a time.  You can also use Benadryl over the counter for itching or also to help with sleep.   TED HOSE STOCKINGS:  Use stockings on both legs until for at least 2 weeks or as directed by physician office. They may be removed at night for sleeping.  MEDICATIONS:  See your medication summary on the "After Visit Summary" that nursing will review with you.  You may have some home medications which will be placed on hold until you complete the course of blood thinner medication.  It is important for you to complete the blood thinner medication as prescribed.  Take medicines as prescribed.   You have several different medicines that work in different ways. - Tylenol is for mild to moderate pain. Try to take this medicine before turning to your narcotic medicines.  - Meloxicam is to reduce pain / inflammation - Robaxin is for muscle spasms - Oxycodone is a narcotic pain medicine.  Take this for severe pain. This medicine can  be dehydrating / constipating. - Zofran is for nausea and vomiting. - Omeprazole is for gastric protection. - Aspirin is to prevent blood clots after surgery.   PRECAUTIONS:  If you experience chest pain or shortness of breath - call 911 immediately for transfer to the hospital emergency department.   If you develop a fever greater that 101 F, purulent drainage from wound, increased redness or drainage from wound, foul odor from the wound/dressing, or calf pain -  CONTACT YOUR SURGEON.                                                   FOLLOW-UP APPOINTMENTS:  If you do not already have a post-op appointment, please call the office [phone] for an appointment to be seen by Dr. Percell Miller in 2 weeks.   OTHER INSTRUCTIONS:   MAKE SURE YOU:  Understand these instructions.  Get help right away if you are not doing well or get worse.    Thank you for letting us be a part of your medical care team.  It is a privilege we respect greatly.  We hope these instructions will help you stay on track for a fast and full recovery!   Post-operative opioid taper instructions:   Complete by: As directed    POST-OPERATIVE OPIOID TAPER INSTRUCTIONS: It is important to wean off of your opioid medication as soon as possible. If you do not need pain medication after your surgery it is ok to stop day one. Opioids include: Codeine, Hydrocodone(Norco, Vicodin), Oxycodone(Percocet, oxycontin) and hydromorphone amongst others.  Long term and even short term use of opiods can cause: Increased pain response Dependence Constipation Depression Respiratory depression And more.  Withdrawal symptoms can include Flu like symptoms Nausea, vomiting And more Techniques to manage these symptoms Hydrate well Eat regular healthy meals Stay active Use relaxation techniques(deep breathing, meditating, yoga) Do Not substitute Alcohol to help with tapering If you have been on opioids for less than two weeks and do not have  pain than it is ok to stop all together.  Plan to wean off of opioids This plan should start within one week post op of your joint replacement. Maintain the same interval or time between taking each dose and first decrease the dose.  Cut the total daily intake of opioids by one tablet each day Next start to increase the time between doses. The last dose that should be eliminated is the evening dose.      Weight bearing as tolerated   Complete by: As directed    Wear knee brace at all times except for hygiene, sleep, and when using CPM machine   Laterality: left   Extremity: Lower     Allergies as of 05/11/2020      Reactions   Azithromycin Rash   Cefuroxime Axetil Rash      Medication List    STOP taking these medications   acetaminophen 650 MG CR tablet Commonly known as: TYLENOL Replaced by: acetaminophen 500 MG tablet   ibuprofen 200 MG tablet Commonly known as: ADVIL     TAKE these medications   acetaminophen 500 MG tablet Commonly known as: TYLENOL Take 2 tablets (1,000 mg total) by mouth every 6 (six) hours as needed for mild pain or moderate pain. Replaces: acetaminophen 650 MG CR tablet   ALPRAZolam 0.5 MG tablet Commonly known as: XANAX Take 0.25-0.5 mg by mouth at bedtime as needed for anxiety or sleep.   aspirin EC 81 MG tablet Take 1 tablet (81 mg total) by mouth 2 (two) times daily. For DVT prophylaxis for 30 days after surgery. What changed:   when to take this  additional instructions   diltiazem 360 MG 24 hr capsule Commonly known as: TIAZAC Take 360 mg by mouth daily.   Fish Oil 1000 MG Caps Take 1,000  mg by mouth daily.   furosemide 40 MG tablet Commonly known as: LASIX Take 40 mg by mouth daily as needed for fluid.   hydrochlorothiazide 25 MG tablet Commonly known as: HYDRODIURIL Take 25 mg by mouth daily.   meloxicam 15 MG tablet Commonly known as: MOBIC Take 1 tablet (15 mg total) by mouth daily.   metFORMIN 500 MG  tablet Commonly known as: GLUCOPHAGE Take 250 mg by mouth 2 (two) times daily with a meal.   methocarbamol 500 MG tablet Commonly known as: Robaxin Take 1 tablet (500 mg total) by mouth every 8 (eight) hours as needed for muscle spasms.   omeprazole 20 MG tablet Commonly known as: PriLOSEC OTC Take 1 tablet (20 mg total) by mouth daily. For gastric protection   ondansetron 4 MG tablet Commonly known as: Zofran Take 1 tablet (4 mg total) by mouth daily as needed for nausea or vomiting.   oxyCODONE 5 MG immediate release tablet Commonly known as: Roxicodone Take 1 tablet (5 mg total) by mouth every 6 (six) hours as needed for severe pain. Do not take more than 6 tablets in a 24 hour period.   quinapril 40 MG tablet Commonly known as: ACCUPRIL Take 40 mg by mouth daily.   THERATEARS OP Place 1 drop into both eyes daily.   Vitamin D3 25 MCG (1000 UT) Caps Take 1,000 Units by mouth daily.            Discharge Care Instructions  (From admission, onward)         Start     Ordered   05/11/20 0000  Weight bearing as tolerated       Comments: Wear knee brace at all times except for hygiene, sleep, and when using CPM machine  Question Answer Comment  Laterality left   Extremity Lower      05/11/20 1529          Follow-up Information    Renette Butters, MD. Go on 05/18/2020.   Specialty: Orthopedic Surgery Why: Your appointment is scheduled for 11:15 Contact information: [address] [place] [address] [phone]        [facility]. Go on 05/12/2020.   Why: Your appointment has been scheduled. The facility should call you with a time  Contact information: [address] [place], [place]              Signed: Britt Bottom PA-C 05/11/2020, 3:30 PM

## 2020-05-12 ENCOUNTER — Ambulatory Visit (HOSPITAL_COMMUNITY): Payer: Medicare PPO | Attending: Orthopedic Surgery | Admitting: Physical Therapy

## 2020-05-12 ENCOUNTER — Other Ambulatory Visit: Payer: Self-pay

## 2020-05-12 ENCOUNTER — Encounter (HOSPITAL_COMMUNITY): Payer: Self-pay | Admitting: Physical Therapy

## 2020-05-12 DIAGNOSIS — M25562 Pain in left knee: Secondary | ICD-10-CM | POA: Diagnosis not present

## 2020-05-12 DIAGNOSIS — R2689 Other abnormalities of gait and mobility: Secondary | ICD-10-CM | POA: Insufficient documentation

## 2020-05-12 DIAGNOSIS — R29898 Other symptoms and signs involving the musculoskeletal system: Secondary | ICD-10-CM | POA: Diagnosis not present

## 2020-05-12 DIAGNOSIS — M25662 Stiffness of left knee, not elsewhere classified: Secondary | ICD-10-CM | POA: Insufficient documentation

## 2020-05-12 DIAGNOSIS — M6281 Muscle weakness (generalized): Secondary | ICD-10-CM | POA: Insufficient documentation

## 2020-05-12 NOTE — Therapy (Signed)
[place], [place], [address] Phone: [phone]   Fax:  [phone]  Physical Therapy Evaluation  Patient Details  Name: Patricia Sharp MRN: [id number] Date of Birth: Sep 13, 1950 Referring Provider (PT): Arty Baumgartner MD   Encounter Date: 05/12/2020   PT End of Session - 05/12/20 1555    Visit Number 1    Number of Visits 12    Date for PT Re-Evaluation 06/23/20    Authorization Type [employer] (no visit limit, auth required)    Authorization Time Period 12 visits requested 4/28-6/9 - check auth    Authorization - Visit Number 1    Authorization - Number of Visits 12    Progress Note Due on Visit 10    PT Start Time 4431    PT Stop Time 1538    PT Time Calculation (min) 69 min    Equipment Utilized During Treatment Other (comment)   hinge brace   Activity Tolerance Patient tolerated treatment well;Patient limited by pain    Behavior During Therapy [facility] for tasks assessed/performed;Anxious           Past Medical History:  Diagnosis Date  . Anxiety   . Arthritis   . Depression   . Diabetes mellitus without complication ([place])   . Essential hypertension, benign 10/29/2017  . Hypertension     Past Surgical History:  Procedure Laterality Date  . ABDOMINAL HYSTERECTOMY    . ABDOMINAL SURGERY     to remove fibroid tumors  . BIOPSY  11/25/2017   Procedure: BIOPSY;  Surgeon: Rogene Houston, MD;  [facility];  Service: Endoscopy;;  gastric  . CHOLECYSTECTOMY    . COLONOSCOPY N/A 01/02/2013   Procedure: COLONOSCOPY;  Surgeon: Rogene Houston, MD;  [facility];  Service: Endoscopy;  Laterality: N/A;  825  . ESOPHAGOGASTRODUODENOSCOPY N/A 11/25/2017   Procedure: ESOPHAGOGASTRODUODENOSCOPY (EGD);  Surgeon: Rogene Houston, MD;  [facility];  Service: Endoscopy;  Laterality: N/A;  12:00-moved to 11/11 @ 9:55am per Lelon Frohlich  . REFRACTIVE SURGERY Bilateral   . TOTAL KNEE  ARTHROPLASTY Left 05/10/2020   Procedure: TOTAL KNEE ARTHROPLASTY;  Surgeon: Renette Butters, MD;  [facility];  Service: Orthopedics;  Laterality: Left;    There were no vitals filed for this visit.    Subjective Assessment - 05/12/20 1429    Subjective Patient is a 70 y.o. female who presents to physical therapy s/p L TKA on 05/10/20. She is having a little bit of pain. She is having to weak a Bledsoe brace due to lax ligament. She was not using walker before surgery. She is having trouble with walking, stairs. She is usually pretty active. Her main goal is to get her walking better again and more mobile.    Limitations House hold activities;Walking;Standing    Patient Stated Goals improve mobility and walking    Currently in Pain? Yes    Pain Score 0-No pain   5/10 last night   Pain Location Knee    Pain Orientation Left    Pain Descriptors / Indicators Aching;Burning    Pain Type Surgical pain    Pain Onset In the past 7 days    Pain Frequency Intermittent              [facility] PT Assessment - 05/12/20 0001      Assessment   Medical Diagnosis L TKA    Referring Provider (PT) Arty Baumgartner MD  Onset Date/Surgical Date 05/10/20    Next MD Visit next wednesday    Prior Therapy in hospital      Precautions   Precautions None    Precaution Comments Wear bledsoe brace for 6 weeks    Required Braces or Orthoses Other Brace/Splint    Other Brace/Splint hinge brace      Restrictions   Weight Bearing Restrictions No      Balance Screen   Has the patient fallen in the past 6 months Yes    How many times? 1    Has the patient had a decrease in activity level because of a fear of falling?  No    Is the patient reluctant to leave their home because of a fear of falling?  No      Prior Function   Level of Independence Independent    Vocation Retired      IT consultant   Overall Cognitive Status Within Functional Limits for tasks assessed      Observation/Other  Assessments   Observations Ambulates with RW, hinge brace, limited knee mobility. bruising thoughout LLE, bandage intact    Focus on Therapeutic Outcomes (FOTO)  40% limited      Sensation   Light Touch Appears Intact      ROM / Strength   AROM / PROM / Strength AROM;Strength      AROM   AROM Assessment Site Knee    Right/Left Knee Right;Left    Right Knee Extension 3   hyperextension   Right Knee Flexion 124    Left Knee Extension 12   lacking   Left Knee Flexion 54      Strength   Strength Assessment Site Hip;Knee;Ankle    Right/Left Hip Right;Left    Right Hip Flexion 5/5    Left Hip Flexion 3+/5    Right/Left Knee Right;Left    Right Knee Flexion 5/5    Right Knee Extension 5/5    Left Knee Flexion 4+/5    Left Knee Extension 3+/5    Right/Left Ankle Right;Left    Right Ankle Dorsiflexion 5/5    Left Ankle Dorsiflexion 5/5      Palpation   Palpation comment grossly tender throughout LLE, greatest tenderness medial L gastroc      Transfers   Comments labored, with RW, relies on RLE      Ambulation/Gait   Ambulation/Gait Yes    Ambulation/Gait Assistance 6: Modified independent (Device/Increase time)    Ambulation Distance (Feet) 150 Feet    Assistive device Rolling walker    Gait Pattern Left flexed knee in stance;Antalgic;Decreased hip/knee flexion - left    Ambulation Surface Level;Indoor    Gait velocity decreased    Gait Comments with hinge brace                      Objective measurements completed on examination: See above findings.       [facility] Adult PT Treatment/Exercise - 05/12/20 0001      Exercises   Exercises Knee/Hip      Knee/Hip Exercises: Stretches   Other Knee/Hip Stretches heel prop 5 minutes      Knee/Hip Exercises: Supine   Quad Sets Left;10 reps    Quad Sets Limitations 10 second holds    Heel Slides Left;AAROM;10 reps    Heel Slides Limitations 10 second holds    Other Supine Knee/Hip Exercises ankle pumps 1x 10  PT Education - 05/12/20 1427    Education Details Patient educated on exam findings, POC, scope of PT, HEP, heel prop for extension, limiting time in mid range positions, elevation for edema, typical TKA recovery    Person(s) Educated Patient    Methods Explanation;Demonstration;Handout    Comprehension Verbalized understanding;Returned demonstration            PT Short Term Goals - 05/12/20 1603      PT SHORT TERM GOAL #1   Title Patient will be independent with HEP in order to improve functional outcomes.    Time 3    Period Weeks    Status New    Target Date 06/02/20      PT SHORT TERM GOAL #2   Title Patient will report at least 25% improvement in symptoms for improved quality of life.    Time 3    Period Weeks    Status New    Target Date 06/02/20             PT Long Term Goals - 05/12/20 1604      PT LONG TERM GOAL #1   Title Patient will report at least 75% improvement in symptoms for improved quality of life.    Time 6    Period Weeks    Status New    Target Date 06/23/20      PT LONG TERM GOAL #2   Title Patient will improve FOTO score by at least 25 points in order to indicate improved tolerance to activity.    Time 6    Period Weeks    Status New    Target Date 06/23/20      PT LONG TERM GOAL #3   Title Patient will be able to complete 5x STS in under 11.4 seconds in order to reduce the risk of falls.    Time 6    Period Weeks    Status New    Target Date 06/23/20      PT LONG TERM GOAL #4   Title Patient will improve ROM for left knee extension/flexion to 0-115 degrees to improve squatting, and other functional mobility.    Time 6    Period Weeks    Status New    Target Date 06/23/20                  Plan - 05/12/20 1559    Clinical Impression Statement Patient is a 70 y.o. female who presents to physical therapy s/p L TKA on 05/10/20. She presents with pain limited deficits in L knee strength, ROM, gait,  balance, and functional mobility with ADL. She is having to modify and restrict ADL as indicated by FOTO score as well as subjective information and objective measures which is affecting overall participation. Patient educated extensively throughout session as listed in education section. Patient completes supine exercise with c/o discomfort throughout but is able to complete with verbal cueing and reassurance. Patient will benefit from skilled physical therapy in order to improve function and reduce impairment.    Personal Factors and Comorbidities Age;Fitness;Behavior Pattern;Past/Current Experience;Comorbidity 3+;Time since onset of injury/illness/exacerbation    Comorbidities anxiety, DM, HTN    Examination-Activity Limitations Locomotion Level;Transfers;Bathing;Bend;Squat;Stairs;Stand;Lift;Hygiene/Grooming;Dressing;Carry    Examination-Participation Restrictions Lawyer;Shop;Cleaning;Meal Prep;Community Activity;Laundry    Stability/Clinical Decision Making Stable/Uncomplicated    Clinical Decision Making Low    Rehab Potential Good    PT Frequency 2x / week    PT Duration 6 weeks    PT Treatment/Interventions  ADLs/Self Care Home Management;Aquatic Therapy;Cryotherapy;Electrical Stimulation;Moist Heat;Traction;DME Instruction;Ultrasound;Gait training;Stair training;Functional mobility training;Therapeutic activities;Therapeutic exercise;Balance training;Patient/family education;Manual techniques;Manual lymph drainage;Compression bandaging;Scar mobilization;Passive range of motion;Dry needling;Energy conservation;Splinting;Taping;Vasopneumatic Device;Spinal Manipulations;Joint Manipulations    PT Next Visit Plan continue with L knee mobility and strengthening, possibly add manual for edema/pain/mobility, hinge brace for 6 weeks due to [facility] laxity    PT Home Exercise Plan 4/28 heel prop, quad set, heel slides, ankle pumps    Consulted and Agree with Plan of Care Patient            Patient will benefit from skilled therapeutic intervention in order to improve the following deficits and impairments:  Abnormal gait,Decreased range of motion,Difficulty walking,Decreased endurance,Decreased activity tolerance,Impaired perceived functional ability,Pain,Decreased balance,Impaired flexibility,Improper body mechanics,Increased edema,Decreased strength,Decreased mobility  Visit Diagnosis: Left knee pain, unspecified chronicity  Muscle weakness (generalized)  Other abnormalities of gait and mobility  Stiffness of left knee, not elsewhere classified  Other symptoms and signs involving the musculoskeletal system     Problem List Patient Active Problem List   Diagnosis Date Noted  . S/P total knee arthroplasty, left 05/10/2020  . Essential hypertension, benign 10/29/2017  . Abdominal pain, epigastric 10/29/2017  . Postcoital bleeding 04/02/2014  . Dyspareunia in female 04/02/2014    4:06 PM, 05/12/20 Mearl Latin PT, DPT Physical Therapist at [place], [place], [address] Phone: [phone]   Fax:  [phone]  Name: Patricia Sharp MRN: [id number] Date of Birth: December 06, 1950

## 2020-05-12 NOTE — Patient Instructions (Signed)
Access Code: [email or web address] URL: [email or web address] Date: 05/12/2020 Prepared by: Mitzi Hansen Genna Casimir  Exercises Supine Ankle Pumps - 5 x daily - 7 x weekly - 3 sets - 10 reps Supine Quadricep Sets - 5 x daily - 7 x weekly - 10 reps - 10 second hold Supine Knee Extension Mobilization with Weight - 5 x daily - 7 x weekly - 10-15 minutes hold Supine Heel Slide with Strap - 5 x daily - 7 x weekly - 10 reps - 10 second hold

## 2020-05-16 ENCOUNTER — Ambulatory Visit (HOSPITAL_COMMUNITY): Payer: Medicare PPO | Attending: Orthopedic Surgery

## 2020-05-16 ENCOUNTER — Other Ambulatory Visit: Payer: Self-pay

## 2020-05-16 ENCOUNTER — Encounter (HOSPITAL_COMMUNITY): Payer: Self-pay

## 2020-05-16 DIAGNOSIS — M25662 Stiffness of left knee, not elsewhere classified: Secondary | ICD-10-CM | POA: Insufficient documentation

## 2020-05-16 DIAGNOSIS — R29898 Other symptoms and signs involving the musculoskeletal system: Secondary | ICD-10-CM | POA: Insufficient documentation

## 2020-05-16 DIAGNOSIS — M25562 Pain in left knee: Secondary | ICD-10-CM | POA: Diagnosis not present

## 2020-05-16 DIAGNOSIS — M6281 Muscle weakness (generalized): Secondary | ICD-10-CM | POA: Diagnosis not present

## 2020-05-16 DIAGNOSIS — R2689 Other abnormalities of gait and mobility: Secondary | ICD-10-CM | POA: Insufficient documentation

## 2020-05-16 NOTE — Therapy (Signed)
[place], [place], [address] Phone: [phone]   Fax:  [phone]  Physical Therapy Treatment  Patient Details  Name: Patricia Sharp MRN: [id number] Date of Birth: 1950/02/28 Referring Provider (PT): Arty Baumgartner MD   Encounter Date: 05/16/2020   PT End of Session - 05/16/20 1150    Visit Number 2    Number of Visits 12    Date for PT Re-Evaluation 06/23/20    Authorization Type [employer] (no visit limit, auth required)    Authorization Time Period 12 visits requested 4/28-6/9 - check auth    Authorization - Visit Number 2    Authorization - Number of Visits 12    Progress Note Due on Visit 10    PT Start Time 1101    PT Stop Time 1147    PT Time Calculation (min) 46 min    Equipment Utilized During Treatment Other (comment)   hinge brace   Activity Tolerance Patient tolerated treatment well;Patient limited by pain    Behavior During Therapy [facility] for tasks assessed/performed;Anxious           Past Medical History:  Diagnosis Date  . Anxiety   . Arthritis   . Depression   . Diabetes mellitus without complication ([place])   . Essential hypertension, benign 10/29/2017  . Hypertension     Past Surgical History:  Procedure Laterality Date  . ABDOMINAL HYSTERECTOMY    . ABDOMINAL SURGERY     to remove fibroid tumors  . BIOPSY  11/25/2017   Procedure: BIOPSY;  Surgeon: Rogene Houston, MD;  [facility];  Service: Endoscopy;;  gastric  . CHOLECYSTECTOMY    . COLONOSCOPY N/A 01/02/2013   Procedure: COLONOSCOPY;  Surgeon: Rogene Houston, MD;  [facility];  Service: Endoscopy;  Laterality: N/A;  825  . ESOPHAGOGASTRODUODENOSCOPY N/A 11/25/2017   Procedure: ESOPHAGOGASTRODUODENOSCOPY (EGD);  Surgeon: Rogene Houston, MD;  [facility];  Service: Endoscopy;  Laterality: N/A;  12:00-moved to 11/11 @ 9:55am per Lelon Frohlich  . REFRACTIVE SURGERY Bilateral   . TOTAL KNEE  ARTHROPLASTY Left 05/10/2020   Procedure: TOTAL KNEE ARTHROPLASTY;  Surgeon: Renette Butters, MD;  [facility];  Service: Orthopedics;  Laterality: Left;    There were no vitals filed for this visit.   Subjective Assessment - 05/16/20 1115    Subjective Takes pain medicine before bed and when gets up in the morning. Feels overwhelmed in getting the CPM and exercises done every day. Wonders if she needs to wear the brace for walking around the house like to the bathroom. States she is concerned about the bruising and swelling in the left knee.    Limitations House hold activities;Walking;Standing    Patient Stated Goals improve mobility and walking    Currently in Pain? Yes    Pain Score 5     Pain Location Knee    Pain Orientation Left;Anterior;Medial    Pain Onset In the past 7 days            [facility] Adult PT Treatment/Exercise - 05/16/20 0001      Ambulation/Gait   Ambulation/Gait Yes    Ambulation/Gait Assistance 6: Modified independent (Device/Increase time)    Ambulation Distance (Feet) 125 Feet    Assistive device Rolling walker    Gait Pattern Left flexed knee in stance;Antalgic;Decreased hip/knee flexion - left    Ambulation Surface Level;Indoor    Gait velocity decreased  Gait Comments with hinge brace      Exercises   Exercises Knee/Hip      Knee/Hip Exercises: Seated   Heel Slides AROM;Strengthening;Left;1 set;10 reps   towel under foot     Knee/Hip Exercises: Supine   Quad Sets Left;10 reps    Quad Sets Limitations 5 sec holds    Short Arc Quad Sets AROM;Strengthening;Left;1 set;10 reps    Short Arc Quad Sets Limitations 5 sec hold    Heel Slides Left;AAROM;10 reps    Heel Slides Limitations 5 sec holds    Knee Extension AROM    Knee Extension Limitations 10    Knee Flexion AROM    Knee Flexion Limitations 80      Manual Therapy   Manual Therapy --    Manual therapy comments --             PT Education - 05/16/20 1121    Education Details  Reviewed initial evaluation, goals and POC. Discussed purpose and technique of interventions throughout session. Correct placement of Bledsoe brace. Possible benefit of compression thigh high to help with swelling.    Person(s) Educated Patient    Methods Explanation    Comprehension Verbalized understanding;Need further instruction            PT Short Term Goals - 05/12/20 1603      PT SHORT TERM GOAL #1   Title Patient will be independent with HEP in order to improve functional outcomes.    Time 3    Period Weeks    Status New    Target Date 06/02/20      PT SHORT TERM GOAL #2   Title Patient will report at least 25% improvement in symptoms for improved quality of life.    Time 3    Period Weeks    Status New    Target Date 06/02/20             PT Long Term Goals - 05/12/20 1604      PT LONG TERM GOAL #1   Title Patient will report at least 75% improvement in symptoms for improved quality of life.    Time 6    Period Weeks    Status New    Target Date 06/23/20      PT LONG TERM GOAL #2   Title Patient will improve FOTO score by at least 25 points in order to indicate improved tolerance to activity.    Time 6    Period Weeks    Status New    Target Date 06/23/20      PT LONG TERM GOAL #3   Title Patient will be able to complete 5x STS in under 11.4 seconds in order to reduce the risk of falls.    Time 6    Period Weeks    Status New    Target Date 06/23/20      PT LONG TERM GOAL #4   Title Patient will improve ROM for left knee extension/flexion to 0-115 degrees to improve squatting, and other functional mobility.    Time 6    Period Weeks    Status New    Target Date 06/23/20           Plan - 05/16/20 1154    Clinical Impression Statement Reviewed initial evaluation, goals and POC. Session focused on gait training with RW for more equal length steps and therapeutic exercises to improve ROM and quad activation. Patient's AROM on left knee in  supine  increased to lacking 10 of extension to 80 degrees of flexion (last session lacking 12 to 54 degrees). Patient's hinged brace adjusted in an attempt to keep it from sliding all the way down to her ankle. Patient's hinged brace was well too far distal at beginning of session, impeding left knee ROM during gait cycle. PT discussed possible benefit of thigh high compression stocking to assist with bruise resorption and reduce swelling and stiffness during the day. Added seated heel slides to therapuetic exercises today.    Personal Factors and Comorbidities Age;Fitness;Behavior Pattern;Past/Current Experience;Comorbidity 3+;Time since onset of injury/illness/exacerbation    Comorbidities anxiety, DM, HTN    Examination-Activity Limitations Locomotion Level;Transfers;Bathing;Bend;Squat;Stairs;Stand;Lift;Hygiene/Grooming;Dressing;Carry    Examination-Participation Restrictions Lawyer;Shop;Cleaning;Meal Prep;Community Activity;Laundry    Stability/Clinical Decision Making Stable/Uncomplicated    Rehab Potential Good    PT Frequency 2x / week    PT Duration 6 weeks    PT Treatment/Interventions ADLs/Self Care Home Management;Aquatic Therapy;Cryotherapy;Electrical Stimulation;Moist Heat;Traction;DME Instruction;Ultrasound;Gait training;Stair training;Functional mobility training;Therapeutic activities;Therapeutic exercise;Balance training;Patient/family education;Manual techniques;Manual lymph drainage;Compression bandaging;Scar mobilization;Passive range of motion;Dry needling;Energy conservation;Splinting;Taping;Vasopneumatic Device;Spinal Manipulations;Joint Manipulations    PT Next Visit Plan continue with L knee mobility and strengthening, possibly add manual for edema/pain/mobility, hinge brace for 6 weeks due to [facility] laxity    PT Home Exercise Plan 4/28 heel prop, quad set, heel slides, ankle pumps    Consulted and Agree with Plan of Care Patient           Patient will benefit from  skilled therapeutic intervention in order to improve the following deficits and impairments:  Abnormal gait,Decreased range of motion,Difficulty walking,Decreased endurance,Decreased activity tolerance,Impaired perceived functional ability,Pain,Decreased balance,Impaired flexibility,Improper body mechanics,Increased edema,Decreased strength,Decreased mobility  Visit Diagnosis: Left knee pain, unspecified chronicity  Muscle weakness (generalized)  Other abnormalities of gait and mobility  Stiffness of left knee, not elsewhere classified  Other symptoms and signs involving the musculoskeletal system     Problem List Patient Active Problem List   Diagnosis Date Noted  . S/P total knee arthroplasty, left 05/10/2020  . Essential hypertension, benign 10/29/2017  . Abdominal pain, epigastric 10/29/2017  . Postcoital bleeding 04/02/2014  . Dyspareunia in female 04/02/2014   Floria Raveling. Hartnett-Rands, MS, PT Per [place] [phone]  Jeannie Done 05/16/2020, 11:56 AM  [place], [place], [address] Phone: [phone]   Fax:  [phone]  Name: AELA BOHAN MRN: [id number] Date of Birth: 03/06/1950

## 2020-05-18 ENCOUNTER — Ambulatory Visit (HOSPITAL_COMMUNITY): Payer: Medicare PPO | Admitting: Physical Therapy

## 2020-05-18 DIAGNOSIS — M1712 Unilateral primary osteoarthritis, left knee: Secondary | ICD-10-CM | POA: Diagnosis not present

## 2020-05-20 ENCOUNTER — Encounter (HOSPITAL_COMMUNITY): Payer: Self-pay | Admitting: Physical Therapy

## 2020-05-20 ENCOUNTER — Ambulatory Visit (HOSPITAL_COMMUNITY): Payer: Medicare PPO | Admitting: Physical Therapy

## 2020-05-20 ENCOUNTER — Other Ambulatory Visit: Payer: Self-pay

## 2020-05-20 DIAGNOSIS — R2689 Other abnormalities of gait and mobility: Secondary | ICD-10-CM | POA: Diagnosis not present

## 2020-05-20 DIAGNOSIS — R29898 Other symptoms and signs involving the musculoskeletal system: Secondary | ICD-10-CM

## 2020-05-20 DIAGNOSIS — M25662 Stiffness of left knee, not elsewhere classified: Secondary | ICD-10-CM

## 2020-05-20 DIAGNOSIS — M6281 Muscle weakness (generalized): Secondary | ICD-10-CM | POA: Diagnosis not present

## 2020-05-20 DIAGNOSIS — M25562 Pain in left knee: Secondary | ICD-10-CM

## 2020-05-20 NOTE — Therapy (Signed)
[place], [place], [address] Phone: [phone]   Fax:  [phone]  Physical Therapy Treatment  Patient Details  Name: Patricia Sharp MRN: [id number] Date of Birth: 29-Mar-1950 Referring Provider (PT): Arty Baumgartner MD   Encounter Date: 05/20/2020   PT End of Session - 05/20/20 1356    Visit Number 3    Number of Visits 12    Date for PT Re-Evaluation 06/23/20    Authorization Type [employer] (no visit limit, auth required)    Authorization Time Period 12 visits requested 4/28-6/9 - check auth    Authorization - Visit Number 3    Authorization - Number of Visits 12    Progress Note Due on Visit 10    PT Start Time 1400    PT Stop Time 1440    PT Time Calculation (min) 40 min    Equipment Utilized During Treatment Other (comment)   hinge brace   Activity Tolerance Patient tolerated treatment well;Patient limited by pain    Behavior During Therapy [facility] for tasks assessed/performed;Anxious           Past Medical History:  Diagnosis Date  . Anxiety   . Arthritis   . Depression   . Diabetes mellitus without complication ([place])   . Essential hypertension, benign 10/29/2017  . Hypertension     Past Surgical History:  Procedure Laterality Date  . ABDOMINAL HYSTERECTOMY    . ABDOMINAL SURGERY     to remove fibroid tumors  . BIOPSY  11/25/2017   Procedure: BIOPSY;  Surgeon: Rogene Houston, MD;  [facility];  Service: Endoscopy;;  gastric  . CHOLECYSTECTOMY    . COLONOSCOPY N/A 01/02/2013   Procedure: COLONOSCOPY;  Surgeon: Rogene Houston, MD;  [facility];  Service: Endoscopy;  Laterality: N/A;  825  . ESOPHAGOGASTRODUODENOSCOPY N/A 11/25/2017   Procedure: ESOPHAGOGASTRODUODENOSCOPY (EGD);  Surgeon: Rogene Houston, MD;  [facility];  Service: Endoscopy;  Laterality: N/A;  12:00-moved to 11/11 @ 9:55am per Lelon Frohlich  . REFRACTIVE SURGERY Bilateral   . TOTAL KNEE  ARTHROPLASTY Left 05/10/2020   Procedure: TOTAL KNEE ARTHROPLASTY;  Surgeon: Renette Butters, MD;  [facility];  Service: Orthopedics;  Laterality: Left;    There were no vitals filed for this visit.   Subjective Assessment - 05/20/20 1400    Subjective Pt states that she is sore; she went to the MD and got a good report.  She is still using the CPM    Limitations House hold activities;Walking;Standing    Patient Stated Goals improve mobility and walking    Currently in Pain? Yes    Pain Score 5     Pain Location Knee    Pain Orientation Left    Pain Descriptors / Indicators Aching    Pain Onset In the past 7 days    Pain Frequency Intermittent                     [facility] Adult PT Treatment/Exercise - 05/20/20 0001      Ambulation/Gait   Ambulation/Gait Assistance 6: Modified independent (Device/Increase time)    Ambulation Distance (Feet) 226 Feet    Assistive device Rolling walker    Gait Comments with hinge brace   increasing cadance     Exercises   Exercises Knee/Hip      Knee/Hip Exercises: Standing   Heel Raises Both;10 reps    Knee Flexion  Left;5 reps      Knee/Hip Exercises: Seated   Long Arc Quad Left;10 reps      Knee/Hip Exercises: Supine   Quad Sets Left;15 reps    Short Arc Quad Sets Left;10 reps    Heel Slides Left;10 reps    Straight Leg Raises Left;5 reps    Knee Extension Limitations 8    Knee Flexion Limitations 85      Manual Therapy   Manual Therapy Edema management    Manual therapy comments completed seperate from all other aspect    Edema Management to decrease edema and pain                    PT Short Term Goals - 05/20/20 1418      PT SHORT TERM GOAL #1   Title Patient will be independent with HEP in order to improve functional outcomes.    Time 3    Period Weeks    Status Achieved    Target Date 06/02/20      PT SHORT TERM GOAL #2   Title Patient will report at least 25% improvement in symptoms for  improved quality of life.    Time 3    Period Weeks    Status Achieved    Target Date 06/02/20             PT Long Term Goals - 05/20/20 1419      PT LONG TERM GOAL #1   Title Patient will report at least 75% improvement in symptoms for improved quality of life.    Time 6    Period Weeks    Status Achieved      PT LONG TERM GOAL #2   Title Patient will improve FOTO score by at least 25 points in order to indicate improved tolerance to activity.    Time 6    Period Weeks    Status Achieved      PT LONG TERM GOAL #3   Title Patient will be able to complete 5x STS in under 11.4 seconds in order to reduce the risk of falls.    Time 6    Period Weeks    Status Achieved      PT LONG TERM GOAL #4   Title Patient will improve ROM for left knee extension/flexion to 0-115 degrees to improve squatting, and other functional mobility.    Time 6    Period Weeks    Status Achieved                 Plan - 05/20/20 1356    Clinical Impression Statement Advanced HEP.  PT continues to have significant discomfort but is improving in mm control and ROM.  Pt continues to have difficulty with her braace sliding down.    Personal Factors and Comorbidities Age;Fitness;Behavior Pattern;Past/Current Experience;Comorbidity 3+;Time since onset of injury/illness/exacerbation    Comorbidities anxiety, DM, HTN    Examination-Activity Limitations Locomotion Level;Transfers;Bathing;Bend;Squat;Stairs;Stand;Lift;Hygiene/Grooming;Dressing;Carry    Examination-Participation Restrictions Lawyer;Shop;Cleaning;Meal Prep;Community Activity;Laundry    Stability/Clinical Decision Making Stable/Uncomplicated    Rehab Potential Good    PT Frequency 2x / week    PT Duration 6 weeks    PT Treatment/Interventions ADLs/Self Care Home Management;Aquatic Therapy;Cryotherapy;Electrical Stimulation;Moist Heat;Traction;DME Instruction;Ultrasound;Gait training;Stair training;Functional mobility  training;Therapeutic activities;Therapeutic exercise;Balance training;Patient/family education;Manual techniques;Manual lymph drainage;Compression bandaging;Scar mobilization;Passive range of motion;Dry needling;Energy conservation;Splinting;Taping;Vasopneumatic Device;Spinal Manipulations;Joint Manipulations    PT Next Visit Plan continue with L knee mobility and strengthening, possibly add manual for edema/pain/mobility,  hinge brace for 6 weeks due to [facility] [place] [facility] laxity    PT Home Exercise Plan 4/28 heel prop, quad set, heel slides, ankle pumps; 5/6:  heel raise, mini squat SLR    Consulted and Agree with Plan of Care Patient           Patient will benefit from skilled therapeutic intervention in order to improve the following deficits and impairments:  Abnormal gait,Decreased range of motion,Difficulty walking,Decreased endurance,Decreased activity tolerance,Impaired perceived functional ability,Pain,Decreased balance,Impaired flexibility,Improper body mechanics,Increased edema,Decreased strength,Decreased mobility  Visit Diagnosis: Left knee pain, unspecified chronicity  Muscle weakness (generalized)  Other abnormalities of gait and mobility  Stiffness of left knee, not elsewhere classified  Other symptoms and signs involving the musculoskeletal system     Problem List Patient Active Problem List   Diagnosis Date Noted  . S/P total knee arthroplasty, left 05/10/2020  . Essential hypertension, benign 10/29/2017  . Abdominal pain, epigastric 10/29/2017  . Postcoital bleeding 04/02/2014  . Dyspareunia in female 04/02/2014    Rayetta Humphrey, PT CLT [phone] 05/20/2020, 2:45 PM  [place] [address] [place], [place], [address] Phone: [phone]   Fax:  [phone]  Name: ZALMA CHANNING MRN: [id number] Date of Birth: 26-Apr-1950

## 2020-05-23 ENCOUNTER — Ambulatory Visit (HOSPITAL_COMMUNITY): Payer: Medicare PPO

## 2020-05-23 ENCOUNTER — Other Ambulatory Visit: Payer: Self-pay

## 2020-05-23 ENCOUNTER — Encounter (HOSPITAL_COMMUNITY): Payer: Self-pay

## 2020-05-23 DIAGNOSIS — R29898 Other symptoms and signs involving the musculoskeletal system: Secondary | ICD-10-CM | POA: Diagnosis not present

## 2020-05-23 DIAGNOSIS — M25662 Stiffness of left knee, not elsewhere classified: Secondary | ICD-10-CM | POA: Diagnosis not present

## 2020-05-23 DIAGNOSIS — R2689 Other abnormalities of gait and mobility: Secondary | ICD-10-CM

## 2020-05-23 DIAGNOSIS — M25562 Pain in left knee: Secondary | ICD-10-CM | POA: Diagnosis not present

## 2020-05-23 DIAGNOSIS — M6281 Muscle weakness (generalized): Secondary | ICD-10-CM

## 2020-05-23 NOTE — Therapy (Signed)
[facility] [address] [place], [place], [address] Phone: [phone]   Fax:  [phone]  Physical Therapy Treatment  Patient Details  Name: [name] MRN: [id number] Date of Birth: [birth year] Referring Provider (PT): Arty Baumgartner MD   Encounter Date: 05/23/2020   PT End of Session - 05/23/20 1139    Visit Number 4    Number of Visits 12    Date for PT Re-Evaluation 06/23/20    Authorization Type [employer] (no visit limit, auth required)    Authorization Time Period 12 visits requested 4/28-6/9 - check auth    Authorization - Visit Number 4    Authorization - Number of Visits 12    Progress Note Due on Visit 10    PT Start Time 1100    PT Stop Time 1145    PT Time Calculation (min) 45 min    Equipment Utilized During Treatment Other (comment)   hinge brace   Activity Tolerance Patient tolerated treatment well;Patient limited by pain    Behavior During Therapy [facility] for tasks assessed/performed;Anxious           Past Medical History:  Diagnosis Date  . Anxiety   . Arthritis   . Depression   . Diabetes mellitus without complication ([place])   . Essential hypertension, benign 10/29/2017  . Hypertension     Past Surgical History:  Procedure Laterality Date  . ABDOMINAL HYSTERECTOMY    . ABDOMINAL SURGERY     to remove fibroid tumors  . BIOPSY  11/25/2017   Procedure: BIOPSY;  Surgeon: Rogene Houston, MD;  [facility];  Service: Endoscopy;;  gastric  . CHOLECYSTECTOMY    . COLONOSCOPY N/A 01/02/2013   Procedure: COLONOSCOPY;  Surgeon: Rogene Houston, MD;  [facility];  Service: Endoscopy;  Laterality: N/A;  825  . ESOPHAGOGASTRODUODENOSCOPY N/A 11/25/2017   Procedure: ESOPHAGOGASTRODUODENOSCOPY (EGD);  Surgeon: Rogene Houston, MD;  [facility];  Service: Endoscopy;  Laterality: N/A;  12:00-moved to 11/11 @ 9:55am per Lelon Frohlich  . REFRACTIVE SURGERY Bilateral   . TOTAL KNEE  ARTHROPLASTY Left 05/10/2020   Procedure: TOTAL KNEE ARTHROPLASTY;  Surgeon: Renette Butters, MD;  [facility];  Service: Orthopedics;  Laterality: Left;    There were no vitals filed for this visit.   Subjective Assessment - 05/23/20 1115    Subjective Took hydrocodone this morning. Still painful all around the front of the knee, especially the inside where the ligament is stretched. Using CPM less and doing HEP more.    Limitations House hold activities;Walking;Standing    Patient Stated Goals improve mobility and walking    Currently in Pain? Yes    Pain Score 5     Pain Location Knee    Pain Orientation Left;Anterior;Medial    Pain Onset In the past 7 days            [facility] Adult PT Treatment/Exercise - 05/23/20 0001      Ambulation/Gait   Ambulation/Gait Yes    Ambulation/Gait Assistance 6: Modified independent (Device/Increase time)    Ambulation Distance (Feet) 226 Feet    Assistive device Rolling walker    Ambulation Surface Level;Indoor    Gait velocity decreased    Gait Comments cues for equal length steps, to walk within base of support, to decreased reliance on upper extremities      Knee/Hip Exercises: Stretches   Knee: Self-Stretch to increase Flexion Left;10 seconds  Knee: Self-Stretch Limitations x10, 12 inch step    Gastroc Stretch Both;3 reps;30 seconds    Gastroc Stretch Limitations slantboard      Knee/Hip Exercises: Standing   Heel Raises Both;10 reps    Knee Flexion Left;10 reps      Knee/Hip Exercises: Seated   Long Arc Quad Left;10 reps    Long Arc Quad Limitations 3 sec hold      Knee/Hip Exercises: Supine   Quad Sets Left;15 reps    [employer] Limitations 5 sec hold    Short Arc [employer] Left;15 reps    Short Arc Quad Sets Limitations 3 sec hold    Heel Slides Left;10 reps    Heel Slides Limitations 5 seconds    Knee Extension AROM    Knee Extension Limitations 6    Knee Flexion AROM    Knee Flexion Limitations 97             PT Education - 05/23/20 1119    Education Details Discussed purpose and technique of interventions throughout session. Correct donning and placement of hinged brace.    Person(s) Educated Patient    Methods Explanation    Comprehension Verbalized understanding            PT Short Term Goals - 05/20/20 1418      PT SHORT TERM GOAL #1   Title Patient will be independent with HEP in order to improve functional outcomes.    Time 3    Period Weeks    Status Achieved    Target Date 06/02/20      PT SHORT TERM GOAL #2   Title Patient will report at least 25% improvement in symptoms for improved quality of life.    Time 3    Period Weeks    Status Achieved    Target Date 06/02/20             PT Long Term Goals - 05/20/20 1419      PT LONG TERM GOAL #1   Title Patient will report at least 75% improvement in symptoms for improved quality of life.    Time 6    Period Weeks    Status Achieved      PT LONG TERM GOAL #2   Title Patient will improve FOTO score by at least 25 points in order to indicate improved tolerance to activity.    Time 6    Period Weeks    Status Achieved      PT LONG TERM GOAL #3   Title Patient will be able to complete 5x STS in under 11.4 seconds in order to reduce the risk of falls.    Time 6    Period Weeks    Status Achieved      PT LONG TERM GOAL #4   Title Patient will improve ROM for left knee extension/flexion to 0-115 degrees to improve squatting, and other functional mobility.    Time 6    Period Weeks    Status Achieved            Plan - 05/23/20 1139    Clinical Impression Statement Sessoion focused on correct placement and donning of hinged brace, gait training and therapeutic exercises for quad strength and left knee ROM. Patient requires verbal cuing and modeling for correct performance of therapeutic exercises. Patient was physically able to don hinged brace at end of session but required verbal cues for correct straping and  placement. Patient reported she will  not have assistance in her home starting next week. AROM of left knee increased to lacking 6 degrees to 97 (was lacking 8 to 84 degrees last session).    Personal Factors and Comorbidities Age;Fitness;Behavior Pattern;Past/Current Experience;Comorbidity 3+;Time since onset of injury/illness/exacerbation    Comorbidities anxiety, DM, HTN    Examination-Activity Limitations Locomotion Level;Transfers;Bathing;Bend;Squat;Stairs;Stand;Lift;Hygiene/Grooming;Dressing;Carry    Examination-Participation Restrictions Lawyer;Shop;Cleaning;Meal Prep;Community Activity;Laundry    Stability/Clinical Decision Making Stable/Uncomplicated    Rehab Potential Good    PT Frequency 2x / week    PT Duration 6 weeks    PT Treatment/Interventions ADLs/Self Care Home Management;Aquatic Therapy;Cryotherapy;Electrical Stimulation;Moist Heat;Traction;DME Instruction;Ultrasound;Gait training;Stair training;Functional mobility training;Therapeutic activities;Therapeutic exercise;Balance training;Patient/family education;Manual techniques;Manual lymph drainage;Compression bandaging;Scar mobilization;Passive range of motion;Dry needling;Energy conservation;Splinting;Taping;Vasopneumatic Device;Spinal Manipulations;Joint Manipulations    PT Next Visit Plan continue with L knee mobility and strengthening, possibly add manual for edema/pain/mobility, hinge brace for 6 weeks due to [facility] laxity    PT Home Exercise Plan 4/28 heel prop, quad set, heel slides, ankle pumps; 5/6:  heel raise, mini squat SLR    Consulted and Agree with Plan of Care Patient           Patient will benefit from skilled therapeutic intervention in order to improve the following deficits and impairments:  Abnormal gait,Decreased range of motion,Difficulty walking,Decreased endurance,Decreased activity tolerance,Impaired perceived functional ability,Pain,Decreased balance,Impaired flexibility,Improper body  mechanics,Increased edema,Decreased strength,Decreased mobility  Visit Diagnosis: Left knee pain, unspecified chronicity  Muscle weakness (generalized)  Other abnormalities of gait and mobility  Stiffness of left knee, not elsewhere classified  Other symptoms and signs involving the musculoskeletal system     Problem List Patient Active Problem List   Diagnosis Date Noted  . S/P total knee arthroplasty, left 05/10/2020  . Essential hypertension, benign 10/29/2017  . Abdominal pain, epigastric 10/29/2017  . Postcoital bleeding 04/02/2014  . Dyspareunia in female 04/02/2014   Floria Raveling. Hartnett-Rands, MS, PT Per [place] [phone]  Jeannie Done 05/23/2020, 11:59 AM  [place], [place], [address] Phone: [phone]   Fax:  [phone]  Name: Patricia Sharp MRN: [id number] Date of Birth: Jul 01, 1950

## 2020-05-24 ENCOUNTER — Telehealth (HOSPITAL_COMMUNITY): Payer: Self-pay | Admitting: Physical Therapy

## 2020-05-25 ENCOUNTER — Ambulatory Visit (HOSPITAL_COMMUNITY): Payer: Medicare PPO | Admitting: Physical Therapy

## 2020-05-25 ENCOUNTER — Encounter (HOSPITAL_COMMUNITY): Payer: Self-pay | Admitting: Physical Therapy

## 2020-05-25 ENCOUNTER — Other Ambulatory Visit: Payer: Self-pay

## 2020-05-25 DIAGNOSIS — M25662 Stiffness of left knee, not elsewhere classified: Secondary | ICD-10-CM

## 2020-05-25 DIAGNOSIS — M25562 Pain in left knee: Secondary | ICD-10-CM | POA: Diagnosis not present

## 2020-05-25 DIAGNOSIS — M6281 Muscle weakness (generalized): Secondary | ICD-10-CM | POA: Diagnosis not present

## 2020-05-25 DIAGNOSIS — R2689 Other abnormalities of gait and mobility: Secondary | ICD-10-CM | POA: Diagnosis not present

## 2020-05-25 DIAGNOSIS — R29898 Other symptoms and signs involving the musculoskeletal system: Secondary | ICD-10-CM | POA: Diagnosis not present

## 2020-05-25 NOTE — Telephone Encounter (Signed)
Error Clay Solum, PT CLT [phone]  

## 2020-05-25 NOTE — Therapy (Signed)
Patricia Sharp, [place], [address] Phone: [phone]   Fax:  [phone]  Physical Therapy Treatment  Patient Details  Name: Patricia Sharp MRN: [id number] Date of Birth: Aug 20, 1950 Referring Provider (PT): Arty Baumgartner MD   Encounter Date: 05/25/2020   PT End of Session - 05/25/20 1127    Visit Number 5    Number of Visits 12    Date for PT Re-Evaluation 06/23/20    Authorization Type [employer] (no visit limit, auth required)    Authorization Time Period 12 visits requested 4/28-6/9 - check auth    Authorization - Visit Number 5    Authorization - Number of Visits 12    Progress Note Due on Visit 10    PT Start Time 1118    PT Stop Time 1202    PT Time Calculation (min) 44 min    Equipment Utilized During Treatment Other (comment)   hinge brace   Activity Tolerance Patient tolerated treatment well    Behavior During Therapy [facility] for tasks assessed/performed           Past Medical History:  Diagnosis Date  . Anxiety   . Arthritis   . Depression   . Diabetes mellitus without complication ([place])   . Essential hypertension, benign 10/29/2017  . Hypertension     Past Surgical History:  Procedure Laterality Date  . ABDOMINAL HYSTERECTOMY    . ABDOMINAL SURGERY     to remove fibroid tumors  . BIOPSY  11/25/2017   Procedure: BIOPSY;  Surgeon: Rogene Houston, MD;  [facility];  Service: Endoscopy;;  gastric  . CHOLECYSTECTOMY    . COLONOSCOPY N/A 01/02/2013   Procedure: COLONOSCOPY;  Surgeon: Rogene Houston, MD;  [facility];  Service: Endoscopy;  Laterality: N/A;  825  . ESOPHAGOGASTRODUODENOSCOPY N/A 11/25/2017   Procedure: ESOPHAGOGASTRODUODENOSCOPY (EGD);  Surgeon: Rogene Houston, MD;  [facility];  Service: Endoscopy;  Laterality: N/A;  12:00-moved to 11/11 @ 9:55am per Lelon Frohlich  . REFRACTIVE SURGERY Bilateral   . TOTAL KNEE ARTHROPLASTY Left 05/10/2020    Procedure: TOTAL KNEE ARTHROPLASTY;  Surgeon: Renette Butters, MD;  [facility];  Service: Orthopedics;  Laterality: Left;    There were no vitals filed for this visit.   Subjective Assessment - 05/25/20 1127    Subjective Patient says she was sore after last visit. She is getting better with donning brace. Has been icing and doing HEP. Her helper leaves today.    Limitations House hold activities;Walking;Standing    Patient Stated Goals improve mobility and walking    Currently in Pain? Yes    Pain Score 5     Pain Location Knee    Pain Orientation Left    Pain Descriptors / Indicators Aching    Pain Type Surgical pain    Pain Onset In the past 7 days    Pain Frequency Intermittent                             [facility] Adult PT Treatment/Exercise - 05/25/20 0001      Knee/Hip Exercises: Standing   Gait Training 100 feet with SPC      Knee/Hip Exercises: Supine   Quad Sets Left;10 reps    Heel Slides Left;10 reps;AAROM    Straight Leg Raises Left;10 reps    Knee Extension AROM    Knee Extension  Limitations 5    Knee Flexion AROM    Knee Flexion Limitations 87      Manual Therapy   Manual Therapy Edema management    Manual therapy comments completed seperate from all other aspect    Edema Management LT knee message to decrease edema and pain                    PT Short Term Goals - 05/20/20 1418      PT SHORT TERM GOAL #1   Title Patient will be independent with HEP in order to improve functional outcomes.    Time 3    Period Weeks    Status Achieved    Target Date 06/02/20      PT SHORT TERM GOAL #2   Title Patient will report at least 25% improvement in symptoms for improved quality of life.    Time 3    Period Weeks    Status Achieved    Target Date 06/02/20             PT Long Term Goals - 05/20/20 1419      PT LONG TERM GOAL #1   Title Patient will report at least 75% improvement in symptoms for improved quality of  life.    Time 6    Period Weeks    Status Achieved      PT LONG TERM GOAL #2   Title Patient will improve FOTO score by at least 25 points in order to indicate improved tolerance to activity.    Time 6    Period Weeks    Status Achieved      PT LONG TERM GOAL #3   Title Patient will be able to complete 5x STS in under 11.4 seconds in order to reduce the risk of falls.    Time 6    Period Weeks    Status Achieved      PT LONG TERM GOAL #4   Title Patient will improve ROM for left knee extension/flexion to 0-115 degrees to improve squatting, and other functional mobility.    Time 6    Period Weeks    Status Achieved                 Plan - 05/25/20 1416    Clinical Impression Statement Began session with patient education. Patient had several questions about knee brace, therapy POC, procedure, routine healing and progression with AD. Answered all patient questions. Activity graded per patient tolerance today per report of increased soreness and swelling. Performed manual edema message and mobilization for improved mobility and reduce pain/ swelling. Initiated gait training with SPC. Patient did well with this, but instructed to hold on ambulating exclusively with cane until we can work on more static balance next week. Patient will continue to benefit from skilled therapy services to reduce deficits and improve functional ability.    Personal Factors and Comorbidities Age;Fitness;Behavior Pattern;Past/Current Experience;Comorbidity 3+;Time since onset of injury/illness/exacerbation    Comorbidities anxiety, DM, HTN    Examination-Activity Limitations Locomotion Level;Transfers;Bathing;Bend;Squat;Stairs;Stand;Lift;Hygiene/Grooming;Dressing;Carry    Examination-Participation Restrictions Lawyer;Shop;Cleaning;Meal Prep;Community Activity;Laundry    Stability/Clinical Decision Making Stable/Uncomplicated    Rehab Potential Good    PT Frequency 2x / week    PT Duration 6  weeks    PT Treatment/Interventions ADLs/Self Care Home Management;Aquatic Therapy;Cryotherapy;Electrical Stimulation;Moist Heat;Traction;DME Instruction;Ultrasound;Gait training;Stair training;Functional mobility training;Therapeutic activities;Therapeutic exercise;Balance training;Patient/family education;Manual techniques;Manual lymph drainage;Compression bandaging;Scar mobilization;Passive range of motion;Dry needling;Energy conservation;Splinting;Taping;Vasopneumatic Device;Spinal Manipulations;Joint Manipulations  PT Next Visit Plan continue with L knee mobility and strengthening, possibly add manual for edema/pain/mobility, hinge brace for 6 weeks due to MCL laxity. Static balance and gait progressions    PT Home Exercise Plan 4/28 heel prop, quad set, heel slides, ankle pumps; 5/6:  heel raise, mini squat SLR    Consulted and Agree with Plan of Care Patient           Patient will benefit from skilled therapeutic intervention in order to improve the following deficits and impairments:  Abnormal gait,Decreased range of motion,Difficulty walking,Decreased endurance,Decreased activity tolerance,Impaired perceived functional ability,Pain,Decreased balance,Impaired flexibility,Improper body mechanics,Increased edema,Decreased strength,Decreased mobility  Visit Diagnosis: Left knee pain, unspecified chronicity  Muscle weakness (generalized)  Other abnormalities of gait and mobility  Stiffness of left knee, not elsewhere classified  Other symptoms and signs involving the musculoskeletal system     Problem List Patient Active Problem List   Diagnosis Date Noted  . S/P total knee arthroplasty, left 05/10/2020  . Essential hypertension, benign 10/29/2017  . Abdominal pain, epigastric 10/29/2017  . Postcoital bleeding 04/02/2014  . Dyspareunia in female 04/02/2014    2:23 PM, 05/25/20 Josue Hector PT DPT  Physical Therapist with [facility]  [phone]   [facility] [address] [place], [place], [address] Phone: [phone]   Fax:  [phone]  Name: Patricia Sharp MRN: [id number] Date of Birth: 12-May-1950

## 2020-05-27 ENCOUNTER — Encounter (HOSPITAL_COMMUNITY): Payer: Medicare PPO

## 2020-05-30 ENCOUNTER — Other Ambulatory Visit: Payer: Self-pay

## 2020-05-30 ENCOUNTER — Ambulatory Visit (HOSPITAL_COMMUNITY): Payer: Medicare PPO

## 2020-05-30 ENCOUNTER — Encounter (HOSPITAL_COMMUNITY): Payer: Self-pay

## 2020-05-30 DIAGNOSIS — R2689 Other abnormalities of gait and mobility: Secondary | ICD-10-CM

## 2020-05-30 DIAGNOSIS — R29898 Other symptoms and signs involving the musculoskeletal system: Secondary | ICD-10-CM

## 2020-05-30 DIAGNOSIS — M25562 Pain in left knee: Secondary | ICD-10-CM | POA: Diagnosis not present

## 2020-05-30 DIAGNOSIS — M25662 Stiffness of left knee, not elsewhere classified: Secondary | ICD-10-CM | POA: Diagnosis not present

## 2020-05-30 DIAGNOSIS — M6281 Muscle weakness (generalized): Secondary | ICD-10-CM | POA: Diagnosis not present

## 2020-05-30 NOTE — Patient Instructions (Signed)
Bridge    Lie back, legs bent. Inhale, pressing hips up. Keeping ribs in, lengthen lower back. Exhale, rolling down along spine from top. Repeat 10-15___ times. Do _1___ sessions per day.  http://pm.exer.us/55   Copyright  VHI. All rights reserved.

## 2020-05-30 NOTE — Therapy (Signed)
[place], [place], [address] Phone: [phone]   Fax:  [phone]  Physical Therapy Treatment  Patient Details  Name: Patricia Sharp MRN: [id number] Date of Birth: December 13, 1950 Referring Provider (PT): Arty Baumgartner MD   Encounter Date: 05/30/2020   PT End of Session - 05/30/20 1059    Visit Number 6    Number of Visits 12    Date for PT Re-Evaluation 06/23/20    Authorization Type [employer] (no visit limit, auth required)    Authorization Time Period 12 visits requested 4/28-6/9 - check auth    Authorization - Visit Number 6    Authorization - Number of Visits 12    Progress Note Due on Visit 10    PT Start Time 1100    PT Stop Time 1149    PT Time Calculation (min) 49 min    Equipment Utilized During Treatment Other (comment)   hinge brace   Activity Tolerance Patient tolerated treatment well    Behavior During Therapy [facility] for tasks assessed/performed           Past Medical History:  Diagnosis Date  . Anxiety   . Arthritis   . Depression   . Diabetes mellitus without complication ([place])   . Essential hypertension, benign 10/29/2017  . Hypertension     Past Surgical History:  Procedure Laterality Date  . ABDOMINAL HYSTERECTOMY    . ABDOMINAL SURGERY     to remove fibroid tumors  . BIOPSY  11/25/2017   Procedure: BIOPSY;  Surgeon: Rogene Houston, MD;  [facility];  Service: Endoscopy;;  gastric  . CHOLECYSTECTOMY    . COLONOSCOPY N/A 01/02/2013   Procedure: COLONOSCOPY;  Surgeon: Rogene Houston, MD;  [facility];  Service: Endoscopy;  Laterality: N/A;  825  . ESOPHAGOGASTRODUODENOSCOPY N/A 11/25/2017   Procedure: ESOPHAGOGASTRODUODENOSCOPY (EGD);  Surgeon: Rogene Houston, MD;  [facility];  Service: Endoscopy;  Laterality: N/A;  12:00-moved to 11/11 @ 9:55am per Lelon Frohlich  . REFRACTIVE SURGERY Bilateral   . TOTAL KNEE ARTHROPLASTY Left 05/10/2020    Procedure: TOTAL KNEE ARTHROPLASTY;  Surgeon: Renette Butters, MD;  [facility];  Service: Orthopedics;  Laterality: Left;    There were no vitals filed for this visit.   Subjective Assessment - 05/30/20 1059    Subjective Patient reports she drove to her appointment today. Is not able to get left leg into CPM and secured without help so thinks she will return it. Is too tender and unable to get compression stockings on herself.    Limitations House hold activities;Walking;Standing    Patient Stated Goals improve mobility and walking    Currently in Pain? Yes    Pain Score 5     Pain Location Knee    Pain Orientation Left    Pain Descriptors / Indicators Tender;Sore    Pain Onset In the past 7 days             [facility] Adult PT Treatment/Exercise - 05/30/20 0001      Knee/Hip Exercises: Standing   Gait Training 226 feet with SPC      Knee/Hip Exercises: Supine   Quad Sets Left;10 reps    Quad Sets Limitations 5 sec hold    Short Arc [employer] Left;15 reps    Short Arc Quad Sets Limitations 5 sec hold    Heel Slides Left;10 reps;AAROM    Heel Slides Limitations  5 seconds    Bridges Strengthening;Both;1 set;10 reps    Straight Leg Raises Left;10 reps    Straight Leg Raises Limitations 3 sec hold    Knee Extension AROM    Knee Extension Limitations 5    Knee Flexion AROM    Knee Flexion Limitations 93      Manual Therapy   Manual Therapy Edema management    Manual therapy comments completed seperate from all other skilled interventions    Edema Management legs elevated in wedge, retro massage to reduce edema and pain            PT Education - 05/30/20 1143    Education Details Discussed purpose and technique of interventions throughout session. ACE bandage importance if can't tolerated/don compression stockings. Advanced HEP.    Person(s) Educated Patient    Methods Explanation;Handout    Comprehension Verbalized understanding            PT Short Term Goals  - 05/30/20 1100      PT SHORT TERM GOAL #1   Title Patient will be independent with HEP in order to improve functional outcomes.    Time 3    Period Weeks    Status Achieved    Target Date 06/02/20      PT SHORT TERM GOAL #2   Title Patient will report at least 25% improvement in symptoms for improved quality of life.    Time 3    Period Weeks    Status On-going    Target Date 06/02/20             PT Long Term Goals - 05/30/20 1100      PT LONG TERM GOAL #1   Title Patient will report at least 75% improvement in symptoms for improved quality of life.    Time 6    Period Weeks    Status On-going      PT LONG TERM GOAL #2   Title Patient will improve FOTO score by at least 25 points in order to indicate improved tolerance to activity.    Time 6    Period Weeks    Status On-going      PT LONG TERM GOAL #3   Title Patient will be able to complete 5x STS in under 11.4 seconds in order to reduce the risk of falls.    Time 6    Period Weeks    Status On-going      PT LONG TERM GOAL #4   Title Patient will improve ROM for left knee extension/flexion to 0-115 degrees to improve squatting, and other functional mobility.    Time 6    Period Weeks    Status On-going             Plan - 05/30/20 1100    Clinical Impression Statement Session focused on patient education regarding controlling edema to progress exercises and AROM of left knee. Performed therapeutic exercises for left knee ROM and left leg strengthening. Added supine bridge to therapeutic exercises and HEP. Performed manual therapy for edema and pain management. Left knee AROM lacking 5 degrees of extension to 93 degrees flexion today; an improvement from 87 degrees last session. Patient to bring ACE bandage next session for education on how to don/doff for edema management. Patient will continue to benefit from skilled therapy services to reduce deficits and improve functional ability.    Personal Factors and  Comorbidities Age;Fitness;Behavior Pattern;Past/Current Experience;Comorbidity 3+;Time since onset of injury/illness/exacerbation  Comorbidities anxiety, DM, HTN    Examination-Activity Limitations Locomotion Level;Transfers;Bathing;Bend;Squat;Stairs;Stand;Lift;Hygiene/Grooming;Dressing;Carry    Examination-Participation Restrictions Lawyer;Shop;Cleaning;Meal Prep;Community Activity;Laundry    Stability/Clinical Decision Making Stable/Uncomplicated    Rehab Potential Good    PT Frequency 2x / week    PT Duration 6 weeks    PT Treatment/Interventions ADLs/Self Care Home Management;Aquatic Therapy;Cryotherapy;Electrical Stimulation;Moist Heat;Traction;DME Instruction;Ultrasound;Gait training;Stair training;Functional mobility training;Therapeutic activities;Therapeutic exercise;Balance training;Patient/family education;Manual techniques;Manual lymph drainage;Compression bandaging;Scar mobilization;Passive range of motion;Dry needling;Energy conservation;Splinting;Taping;Vasopneumatic Device;Spinal Manipulations;Joint Manipulations    PT Next Visit Plan continue with L knee mobility and strengthening, possibly add manual for edema/pain/mobility, hinge brace for 6 weeks due to MCL laxity. Static balance and gait progressions    PT Home Exercise Plan 4/28 heel prop, quad set, heel slides, ankle pumps; 5/6:  heel raise, mini squat SLR; 05/30/20 - supine bridge    Consulted and Agree with Plan of Care Patient           Patient will benefit from skilled therapeutic intervention in order to improve the following deficits and impairments:  Abnormal gait,Decreased range of motion,Difficulty walking,Decreased endurance,Decreased activity tolerance,Impaired perceived functional ability,Pain,Decreased balance,Impaired flexibility,Improper body mechanics,Increased edema,Decreased strength,Decreased mobility  Visit Diagnosis: Left knee pain, unspecified chronicity  Muscle weakness  (generalized)  Other abnormalities of gait and mobility  Stiffness of left knee, not elsewhere classified  Other symptoms and signs involving the musculoskeletal system     Problem List Patient Active Problem List   Diagnosis Date Noted  . S/P total knee arthroplasty, left 05/10/2020  . Essential hypertension, benign 10/29/2017  . Abdominal pain, epigastric 10/29/2017  . Postcoital bleeding 04/02/2014  . Dyspareunia in female 04/02/2014   Floria Raveling. Hartnett-Rands, MS, PT Per [place] [phone]  Jeannie Done 05/30/2020, 11:55 AM  [place], [place], [address] Phone: [phone]   Fax:  [phone]  Name: NADJA LINA MRN: [id number] Date of Birth: [birth year]

## 2020-06-01 ENCOUNTER — Ambulatory Visit (HOSPITAL_COMMUNITY): Payer: Medicare PPO | Admitting: Physical Therapy

## 2020-06-01 ENCOUNTER — Other Ambulatory Visit: Payer: Self-pay

## 2020-06-01 ENCOUNTER — Encounter (HOSPITAL_COMMUNITY): Payer: Self-pay | Admitting: Physical Therapy

## 2020-06-01 DIAGNOSIS — R2689 Other abnormalities of gait and mobility: Secondary | ICD-10-CM

## 2020-06-01 DIAGNOSIS — R29898 Other symptoms and signs involving the musculoskeletal system: Secondary | ICD-10-CM | POA: Diagnosis not present

## 2020-06-01 DIAGNOSIS — M6281 Muscle weakness (generalized): Secondary | ICD-10-CM | POA: Diagnosis not present

## 2020-06-01 DIAGNOSIS — M25662 Stiffness of left knee, not elsewhere classified: Secondary | ICD-10-CM

## 2020-06-01 DIAGNOSIS — M25562 Pain in left knee: Secondary | ICD-10-CM

## 2020-06-01 NOTE — Therapy (Signed)
[name], [place], [address] Phone: [phone]   Fax:  [phone]  Physical Therapy Treatment  Patient Details  Name: Patricia Sharp MRN: [id number] Date of Birth: 1950-12-15 Referring Provider (PT): Arty Baumgartner MD   Encounter Date: 06/01/2020   PT End of Session - 06/01/20 1122    Visit Number 7    Number of Visits 12    Date for PT Re-Evaluation 06/23/20    Authorization Type [employer] (no visit limit, auth required)    Authorization Time Period 12 visits requested 4/28-6/9 approved    Authorization - Visit Number 7    Authorization - Number of Visits 12    Progress Note Due on Visit 10    PT Start Time 1127    PT Stop Time 1211    PT Time Calculation (min) 44 min    Equipment Utilized During Treatment Other (comment)   hinge brace   Activity Tolerance Patient tolerated treatment well    Behavior During Therapy [facility] for tasks assessed/performed           Past Medical History:  Diagnosis Date  . Anxiety   . Arthritis   . Depression   . Diabetes mellitus without complication ([place])   . Essential hypertension, benign 10/29/2017  . Hypertension     Past Surgical History:  Procedure Laterality Date  . ABDOMINAL HYSTERECTOMY    . ABDOMINAL SURGERY     to remove fibroid tumors  . BIOPSY  11/25/2017   Procedure: BIOPSY;  Surgeon: Rogene Houston, MD;  [facility];  Service: Endoscopy;;  gastric  . CHOLECYSTECTOMY    . COLONOSCOPY N/A 01/02/2013   Procedure: COLONOSCOPY;  Surgeon: Rogene Houston, MD;  [facility];  Service: Endoscopy;  Laterality: N/A;  825  . ESOPHAGOGASTRODUODENOSCOPY N/A 11/25/2017   Procedure: ESOPHAGOGASTRODUODENOSCOPY (EGD);  Surgeon: Rogene Houston, MD;  [facility];  Service: Endoscopy;  Laterality: N/A;  12:00-moved to 11/11 @ 9:55am per Lelon Frohlich  . REFRACTIVE SURGERY Bilateral   . TOTAL KNEE ARTHROPLASTY Left 05/10/2020    Procedure: TOTAL KNEE ARTHROPLASTY;  Surgeon: Renette Butters, MD;  [facility];  Service: Orthopedics;  Laterality: Left;    There were no vitals filed for this visit.   Subjective Assessment - 06/01/20 1152    Subjective States that she is having a lot of swelling and soreness. Reports she is having about 5/10 pain in her knee. States she has been elevating her knee in the recliner.    Limitations House hold activities;Walking;Standing    Patient Stated Goals improve mobility and walking    Currently in Pain? Yes    Pain Score 5     Pain Location Knee    Pain Orientation Left    Pain Descriptors / Indicators Tender;Sore    Pain Onset In the past 7 days                             [facility] Adult PT Treatment/Exercise - 06/01/20 0001      Knee/Hip Exercises: Supine   Knee Extension AROM    Knee Flexion AROM    Other Supine Knee/Hip Exercises knee flexion with hip bent to 90 degrees- focus on knee extension and flexion on left 3x15 5-10" holds    Other Supine Knee/Hip Exercises knee flexion on ball 6 minutes wiht 10" holds  Manual Therapy   Manual Therapy Edema management    Manual therapy comments completed seperate from all other skilled interventions    Edema Management legs elevated, retro massage to reduce edema and pain                  PT Education - 06/01/20 1141    Education Details on compression garments, on use of butler and on how to use/don sock. on continued use of bledsoe brace as instructed by MD. Measured for compression stockings. on signs and symptoms of blood clot and what to do if she suspects a clot.    Person(s) Educated Patient    Methods Explanation    Comprehension Verbalized understanding            PT Short Term Goals - 05/30/20 1100      PT SHORT TERM GOAL #1   Title Patient will be independent with HEP in order to improve functional outcomes.    Time 3    Period Weeks    Status Achieved    Target Date  06/02/20      PT SHORT TERM GOAL #2   Title Patient will report at least 25% improvement in symptoms for improved quality of life.    Time 3    Period Weeks    Status On-going    Target Date 06/02/20             PT Long Term Goals - 05/30/20 1100      PT LONG TERM GOAL #1   Title Patient will report at least 75% improvement in symptoms for improved quality of life.    Time 6    Period Weeks    Status On-going      PT LONG TERM GOAL #2   Title Patient will improve FOTO score by at least 25 points in order to indicate improved tolerance to activity.    Time 6    Period Weeks    Status On-going      PT LONG TERM GOAL #3   Title Patient will be able to complete 5x STS in under 11.4 seconds in order to reduce the risk of falls.    Time 6    Period Weeks    Status On-going      PT LONG TERM GOAL #4   Title Patient will improve ROM for left knee extension/flexion to 0-115 degrees to improve squatting, and other functional mobility.    Time 6    Period Weeks    Status On-going                 Plan - 06/01/20 1252    Clinical Impression Statement Continued swelling noted throughout but no redness noted throughout leg and negative homan's sign noted. Educated patient on proper elevation (has been elevating in recliner)and how massage, elevation, ROM and compression are the only things that help with swelling. Patient brought ace bandage but bandage was stretched out, did not wrap patient and instead measured for compression garments and encouraged patient to get compression thigh highs secondary to severe swelling throughout her leg. Not concerned about a clot at this time secondary to patient reporting she has had a lot of swelling since surgery. Will continue to monitor. Answered all questions during session.    Personal Factors and Comorbidities Age;Fitness;Behavior Pattern;Past/Current Experience;Comorbidity 3+;Time since onset of injury/illness/exacerbation     Comorbidities anxiety, DM, HTN    Examination-Activity Limitations Locomotion Level;Transfers;Bathing;Bend;Squat;Stairs;Stand;Lift;Hygiene/Grooming;Dressing;Carry    Examination-Participation Restrictions  Yard Work;Volunteer;Shop;Cleaning;Meal Prep;Community Activity;Laundry    Stability/Clinical Decision Making Stable/Uncomplicated    Rehab Potential Good    PT Frequency 2x / week    PT Duration 6 weeks    PT Treatment/Interventions ADLs/Self Care Home Management;Aquatic Therapy;Cryotherapy;Electrical Stimulation;Moist Heat;Traction;DME Instruction;Ultrasound;Gait training;Stair training;Functional mobility training;Therapeutic activities;Therapeutic exercise;Balance training;Patient/family education;Manual techniques;Manual lymph drainage;Compression bandaging;Scar mobilization;Passive range of motion;Dry needling;Energy conservation;Splinting;Taping;Vasopneumatic Device;Spinal Manipulations;Joint Manipulations    PT Next Visit Plan f/u with compression stockings, continue with L knee mobility and strengthening, possibly add manual for edema/pain/mobility, hinge brace for 6 weeks due to MCL laxity. Static balance and gait progressions    PT Home Exercise Plan 4/28 heel prop, quad set, heel slides, ankle pumps; 5/6:  heel raise, mini squat SLR; 05/30/20 - supine bridge    Consulted and Agree with Plan of Care Patient           Patient will benefit from skilled therapeutic intervention in order to improve the following deficits and impairments:  Abnormal gait,Decreased range of motion,Difficulty walking,Decreased endurance,Decreased activity tolerance,Impaired perceived functional ability,Pain,Decreased balance,Impaired flexibility,Improper body mechanics,Increased edema,Decreased strength,Decreased mobility  Visit Diagnosis: Left knee pain, unspecified chronicity  Other abnormalities of gait and mobility  Muscle weakness (generalized)  Stiffness of left knee, not elsewhere  classified     Problem List Patient Active Problem List   Diagnosis Date Noted  . S/P total knee arthroplasty, left 05/10/2020  . Essential hypertension, benign 10/29/2017  . Abdominal pain, epigastric 10/29/2017  . Postcoital bleeding 04/02/2014  . Dyspareunia in female 04/02/2014    12:56 PM, 06/01/20 Jerene Pitch, DPT Physical Therapy with [facility]  [phone] office  [place] [address] [place], [place], [address] Phone: [phone]   Fax:  [phone]  Name: ROE KOFFMAN MRN: [id number] Date of Birth: [birth year]

## 2020-06-03 ENCOUNTER — Encounter (HOSPITAL_COMMUNITY): Payer: Medicare PPO

## 2020-06-06 ENCOUNTER — Other Ambulatory Visit: Payer: Self-pay

## 2020-06-06 ENCOUNTER — Ambulatory Visit (HOSPITAL_COMMUNITY): Payer: Medicare PPO | Admitting: Physical Therapy

## 2020-06-06 DIAGNOSIS — M25662 Stiffness of left knee, not elsewhere classified: Secondary | ICD-10-CM | POA: Diagnosis not present

## 2020-06-06 DIAGNOSIS — R29898 Other symptoms and signs involving the musculoskeletal system: Secondary | ICD-10-CM | POA: Diagnosis not present

## 2020-06-06 DIAGNOSIS — M25562 Pain in left knee: Secondary | ICD-10-CM | POA: Diagnosis not present

## 2020-06-06 DIAGNOSIS — R2689 Other abnormalities of gait and mobility: Secondary | ICD-10-CM | POA: Diagnosis not present

## 2020-06-06 DIAGNOSIS — M6281 Muscle weakness (generalized): Secondary | ICD-10-CM | POA: Diagnosis not present

## 2020-06-06 NOTE — Therapy (Signed)
[place], [place], [address] Phone: [phone]   Fax:  [phone]  Physical Therapy Treatment  Patient Details  Name: Patricia Sharp MRN: [id number] Date of Birth: 1950/08/08 Referring Provider (PT): Arty Baumgartner MD   Encounter Date: 06/06/2020   PT End of Session - 06/06/20 0954    Visit Number 8    Number of Visits 12    Date for PT Re-Evaluation 06/23/20    Authorization Type [employer] (no visit limit, auth required)    Authorization Time Period 12 visits requested 4/28-6/9 approved    Authorization - Visit Number 8    Authorization - Number of Visits 12    Progress Note Due on Visit 10    PT Start Time 0950    PT Stop Time 1028    PT Time Calculation (min) 38 min    Equipment Utilized During Treatment Other (comment)   hinge brace   Activity Tolerance Patient tolerated treatment well    Behavior During Therapy [facility] for tasks assessed/performed           Past Medical History:  Diagnosis Date  . Anxiety   . Arthritis   . Depression   . Diabetes mellitus without complication ([place])   . Essential hypertension, benign 10/29/2017  . Hypertension     Past Surgical History:  Procedure Laterality Date  . ABDOMINAL HYSTERECTOMY    . ABDOMINAL SURGERY     to remove fibroid tumors  . BIOPSY  11/25/2017   Procedure: BIOPSY;  Surgeon: Rogene Houston, MD;  [facility];  Service: Endoscopy;;  gastric  . CHOLECYSTECTOMY    . COLONOSCOPY N/A 01/02/2013   Procedure: COLONOSCOPY;  Surgeon: Rogene Houston, MD;  [facility];  Service: Endoscopy;  Laterality: N/A;  825  . ESOPHAGOGASTRODUODENOSCOPY N/A 11/25/2017   Procedure: ESOPHAGOGASTRODUODENOSCOPY (EGD);  Surgeon: Rogene Houston, MD;  [facility];  Service: Endoscopy;  Laterality: N/A;  12:00-moved to 11/11 @ 9:55am per Lelon Frohlich  . REFRACTIVE SURGERY Bilateral   . TOTAL KNEE ARTHROPLASTY Left 05/10/2020    Procedure: TOTAL KNEE ARTHROPLASTY;  Surgeon: Renette Butters, MD;  [facility];  Service: Orthopedics;  Laterality: Left;    There were no vitals filed for this visit.   Subjective Assessment - 06/06/20 0956    Subjective Still having swelling. Trying to do HEP. Been walking.    Limitations House hold activities;Walking;Standing    Patient Stated Goals improve mobility and walking    Currently in Pain? Yes    Pain Score 3     Pain Location Knee    Pain Orientation Left    Pain Descriptors / Indicators Sore    Pain Type Surgical pain    Pain Onset In the past 7 days    Pain Frequency Intermittent                             [facility] Adult PT Treatment/Exercise - 06/06/20 0001      Knee/Hip Exercises: Stretches   Passive Hamstring Stretch Left;3 reps;30 seconds      Knee/Hip Exercises: Standing   Gait Training 226 feet with SPC    Other Standing Knee Exercises tandem stance 2 x 20"    Other Standing Knee Exercises mini squat x10      Knee/Hip Exercises: Supine   Quad Sets Left;15 reps    Heel Slides Left;15  reps    Bridges 2 sets;10 reps    Straight Leg Raises Left;2 sets;10 reps    Knee Extension AROM    Knee Flexion AROM    Knee Flexion Limitations 93                    PT Short Term Goals - 05/30/20 1100      PT SHORT TERM GOAL #1   Title Patient will be independent with HEP in order to improve functional outcomes.    Time 3    Period Weeks    Status Achieved    Target Date 06/02/20      PT SHORT TERM GOAL #2   Title Patient will report at least 25% improvement in symptoms for improved quality of life.    Time 3    Period Weeks    Status On-going    Target Date 06/02/20             PT Long Term Goals - 05/30/20 1100      PT LONG TERM GOAL #1   Title Patient will report at least 75% improvement in symptoms for improved quality of life.    Time 6    Period Weeks    Status On-going      PT LONG TERM GOAL #2   Title  Patient will improve FOTO score by at least 25 points in order to indicate improved tolerance to activity.    Time 6    Period Weeks    Status On-going      PT LONG TERM GOAL #3   Title Patient will be able to complete 5x STS in under 11.4 seconds in order to reduce the risk of falls.    Time 6    Period Weeks    Status On-going      PT LONG TERM GOAL #4   Title Patient will improve ROM for left knee extension/flexion to 0-115 degrees to improve squatting, and other functional mobility.    Time 6    Period Weeks    Status On-going                 Plan - 06/06/20 1151    Clinical Impression Statement Patient arrives ambulating with RW. She says she doesn't use this at home. Reassessed static balance with patient is able to maintain tandem stance bilaterally >20 seconds without complaint. Discussed with patient that she may DC walker and use SPC for gait. Practiced gait training using [place] in clinic. Patient tolerated this well. Discussed benefit of progressing functional mobility for improving knee function and reduce swelling. Informed patient to increase AAROM heel slides to 3 x daily at home for improved flexion ROM. Also added seated hamstring stretching for knee extension. Issued HEP handout.    Personal Factors and Comorbidities Age;Fitness;Behavior Pattern;Past/Current Experience;Comorbidity 3+;Time since onset of injury/illness/exacerbation    Comorbidities anxiety, DM, HTN    Examination-Activity Limitations Locomotion Level;Transfers;Bathing;Bend;Squat;Stairs;Stand;Lift;Hygiene/Grooming;Dressing;Carry    Examination-Participation Restrictions Lawyer;Shop;Cleaning;Meal Prep;Community Activity;Laundry    Stability/Clinical Decision Making Stable/Uncomplicated    Rehab Potential Good    PT Frequency 2x / week    PT Duration 6 weeks    PT Treatment/Interventions ADLs/Self Care Home Management;Aquatic Therapy;Cryotherapy;Electrical Stimulation;Moist  Heat;Traction;DME Instruction;Ultrasound;Gait training;Stair training;Functional mobility training;Therapeutic activities;Therapeutic exercise;Balance training;Patient/family education;Manual techniques;Manual lymph drainage;Compression bandaging;Scar mobilization;Passive range of motion;Dry needling;Energy conservation;Splinting;Taping;Vasopneumatic Device;Spinal Manipulations;Joint Manipulations    PT Next Visit Plan f/u with compression stockings, continue with L knee mobility and strengthening, possibly add manual  for edema/pain/mobility, hinge brace for 6 weeks due to MCL laxity. Static balance and gait progressions    PT Home Exercise Plan 4/28 heel prop, quad set, heel slides, ankle pumps; 5/6:  heel raise, mini squat SLR; 05/30/20 - supine bridge 5/23 seated hamstring stretch    Consulted and Agree with Plan of Care Patient           Patient will benefit from skilled therapeutic intervention in order to improve the following deficits and impairments:  Abnormal gait,Decreased range of motion,Difficulty walking,Decreased endurance,Decreased activity tolerance,Impaired perceived functional ability,Pain,Decreased balance,Impaired flexibility,Improper body mechanics,Increased edema,Decreased strength,Decreased mobility  Visit Diagnosis: Left knee pain, unspecified chronicity  Other abnormalities of gait and mobility  Muscle weakness (generalized)  Stiffness of left knee, not elsewhere classified     Problem List Patient Active Problem List   Diagnosis Date Noted  . S/P total knee arthroplasty, left 05/10/2020  . Essential hypertension, benign 10/29/2017  . Abdominal pain, epigastric 10/29/2017  . Postcoital bleeding 04/02/2014  . Dyspareunia in female 04/02/2014   11:57 AM, 06/06/20 Josue Hector PT DPT  Physical Therapist with [facility]  [phone] [place] [address] [place], [place],  [address] Phone: [phone]   Fax:  [phone]  Name: CELESTIAL BARNFIELD MRN: [id number] Date of Birth: [birth year]

## 2020-06-06 NOTE — Patient Instructions (Signed)
Access Code: [email or web address] URL: [email or web address] Date: 06/06/2020 Prepared by: Josue Hector  Exercises Seated Hamstring Stretch - 3 x daily - 7 x weekly - 1 sets - 3 reps - 30 seconds hold

## 2020-06-08 ENCOUNTER — Encounter (HOSPITAL_COMMUNITY): Payer: Self-pay | Admitting: Physical Therapy

## 2020-06-08 ENCOUNTER — Other Ambulatory Visit: Payer: Self-pay

## 2020-06-08 ENCOUNTER — Ambulatory Visit (HOSPITAL_COMMUNITY): Payer: Medicare PPO | Admitting: Physical Therapy

## 2020-06-08 DIAGNOSIS — M25662 Stiffness of left knee, not elsewhere classified: Secondary | ICD-10-CM

## 2020-06-08 DIAGNOSIS — M6281 Muscle weakness (generalized): Secondary | ICD-10-CM

## 2020-06-08 DIAGNOSIS — R29898 Other symptoms and signs involving the musculoskeletal system: Secondary | ICD-10-CM

## 2020-06-08 DIAGNOSIS — M25562 Pain in left knee: Secondary | ICD-10-CM

## 2020-06-08 DIAGNOSIS — R2689 Other abnormalities of gait and mobility: Secondary | ICD-10-CM

## 2020-06-08 NOTE — Therapy (Signed)
[place], [place], [address] Phone: [phone]   Fax:  [phone]  Physical Therapy Treatment/ Progress note/Recert  Patient Details  Name: Patricia Sharp MRN: [id number] Date of Birth: [birth year] Referring Provider (PT): Arty Baumgartner MD   Encounter Date: 06/08/2020  Progress Note   Reporting Period 05/12/20 to 06/08/20   See note below for Objective Data and Assessment of Progress/Goals    PT End of Session - 06/08/20 1400    Visit Number 9    Number of Visits 24    Date for PT Re-Evaluation 07/20/20    Authorization Type [employer] (no visit limit, auth required)    Authorization Time Period 12 visits requested 4/28-6/9 approved; 12 visits requested 5/25-7/6 - check auth    Authorization - Visit Number 9    Authorization - Number of Visits 12    Progress Note Due on Visit 19    PT Start Time 1402    PT Stop Time 1442    PT Time Calculation (min) 40 min    Equipment Utilized During Treatment --    Activity Tolerance Patient tolerated treatment well    Behavior During Therapy [facility] for tasks assessed/performed           Past Medical History:  Diagnosis Date  . Anxiety   . Arthritis   . Depression   . Diabetes mellitus without complication ([place])   . Essential hypertension, benign 10/29/2017  . Hypertension     Past Surgical History:  Procedure Laterality Date  . ABDOMINAL HYSTERECTOMY    . ABDOMINAL SURGERY     to remove fibroid tumors  . BIOPSY  11/25/2017   Procedure: BIOPSY;  Surgeon: Rogene Houston, MD;  [facility];  Service: Endoscopy;;  gastric  . CHOLECYSTECTOMY    . COLONOSCOPY N/A 01/02/2013   Procedure: COLONOSCOPY;  Surgeon: Rogene Houston, MD;  [facility];  Service: Endoscopy;  Laterality: N/A;  825  . ESOPHAGOGASTRODUODENOSCOPY N/A 11/25/2017   Procedure: ESOPHAGOGASTRODUODENOSCOPY (EGD);  Surgeon: Rogene Houston, MD;  [facility];  Service: Endoscopy;  Laterality: N/A;  12:00-moved to 11/11 @ 9:55am per Lelon Frohlich  . REFRACTIVE SURGERY Bilateral   . TOTAL KNEE ARTHROPLASTY Left 05/10/2020   Procedure: TOTAL KNEE ARTHROPLASTY;  Surgeon: Renette Butters, MD;  [facility];  Service: Orthopedics;  Laterality: Left;    There were no vitals filed for this visit.   Subjective Assessment - 06/08/20 1401    Subjective Patient still having swelling and pain in the knee. She has been doing her exercises and is doing them 2-3x/day. Patient states 50% improvment with phsyical therapy intervention. She goes back to see MD next Wednesday.    Limitations House hold activities;Walking;Standing    Patient Stated Goals improve mobility and walking    Currently in Pain? No/denies    Pain Onset In the past 7 days              [facility] PT Assessment - 06/08/20 0001      Assessment   Medical Diagnosis L TKA    Referring Provider (PT) Arty Baumgartner MD    Prior Therapy in hospital      Precautions   Precautions None      Restrictions   Weight Bearing Restrictions No      Balance Screen   Has the patient fallen in the past 6 months Yes    How many times?  1    Has the patient had a decrease in activity level because of a fear of falling?  No    Is the patient reluctant to leave their home because of a fear of falling?  No      Prior Function   Level of Independence Independent      Cognition   Overall Cognitive Status Within Functional Limits for tasks assessed      Observation/Other Assessments   Observations Ambulates with SPC with limited knee flexion/extension ROM    Focus on Therapeutic Outcomes (FOTO)  52% function      AROM   Left Knee Extension 7   lacking   Left Knee Flexion 99      Strength   Right Hip Flexion 5/5    Left Hip Flexion 4+/5    Right Knee Flexion 5/5    Right Knee Extension 5/5    Left Knee Flexion 4+/5    Left Knee Extension 4+/5      Transfers   Five time sit to stand comments   14.20 seconds without UE use                         [facility] Adult PT Treatment/Exercise - 06/08/20 0001      Knee/Hip Exercises: Stretches   Quad Stretch 4 reps;30 seconds;Left      Knee/Hip Exercises: Supine   Knee Flexion Limitations improves to 103 following prone knee flexion stretch                  PT Education - 06/08/20 1401    Education Details HEP, reassessment findings    Person(s) Educated Patient    Methods Explanation;Demonstration    Comprehension Verbalized understanding;Returned demonstration            PT Short Term Goals - 06/08/20 1431      PT SHORT TERM GOAL #1   Title Patient will be independent with HEP in order to improve functional outcomes.    Time 3    Period Weeks    Status Achieved    Target Date 06/02/20      PT SHORT TERM GOAL #2   Title Patient will report at least 25% improvement in symptoms for improved quality of life.    Time 3    Period Weeks    Status Achieved    Target Date 06/02/20             PT Long Term Goals - 05/30/20 1100      PT LONG TERM GOAL #1   Title Patient will report at least 75% improvement in symptoms for improved quality of life.    Time 6    Period Weeks    Status On-going      PT LONG TERM GOAL #2   Title Patient will improve FOTO score by at least 25 points in order to indicate improved tolerance to activity.    Time 6    Period Weeks    Status On-going      PT LONG TERM GOAL #3   Title Patient will be able to complete 5x STS in under 11.4 seconds in order to reduce the risk of falls.    Time 6    Period Weeks    Status On-going      PT LONG TERM GOAL #4   Title Patient will improve ROM for left knee extension/flexion to 0-115 degrees to improve squatting, and other functional  mobility.    Time 6    Period Weeks    Status On-going                 Plan - 06/08/20 1401    Clinical Impression Statement Patient has met 2/2 short term goals with ability to  complete HEP and improvement in symptoms. She has met 0/4 long term goals at this time. She continues to be limited by symptoms and impaired strength, ROM, gait, balance, and functional mobility. Patient demonstrating improvement in strength and ROM compared to prior sessions. Patient educated on proper elevation for improving edema and for heel prop for TKE. She is also educated on limiting mid range positions. Will extend POC as patient continues to show deficit in ROM, strength, gait, and functional mobility. Patient will continue to benefit from skilled physical therapy in order to reduce impairment and improve function.    Personal Factors and Comorbidities Age;Fitness;Behavior Pattern;Past/Current Experience;Comorbidity 3+;Time since onset of injury/illness/exacerbation    Comorbidities anxiety, DM, HTN    Examination-Activity Limitations Locomotion Level;Transfers;Bathing;Bend;Squat;Stairs;Stand;Lift;Hygiene/Grooming;Dressing;Carry    Examination-Participation Restrictions Lawyer;Shop;Cleaning;Meal Prep;Community Activity;Laundry    Stability/Clinical Decision Making Stable/Uncomplicated    Rehab Potential Good    PT Frequency 2x / week    PT Duration 6 weeks    PT Treatment/Interventions ADLs/Self Care Home Management;Aquatic Therapy;Cryotherapy;Electrical Stimulation;Moist Heat;Traction;DME Instruction;Ultrasound;Gait training;Stair training;Functional mobility training;Therapeutic activities;Therapeutic exercise;Balance training;Patient/family education;Manual techniques;Manual lymph drainage;Compression bandaging;Scar mobilization;Passive range of motion;Dry needling;Energy conservation;Splinting;Taping;Vasopneumatic Device;Spinal Manipulations;Joint Manipulations    PT Next Visit Plan f/u with compression stockings, continue with L knee mobility and strengthening, possibly add manual for edema/pain/mobility, hinge brace for 6 weeks due to MCL laxity. Static balance and gait  progressions    PT Home Exercise Plan 4/28 heel prop, quad set, heel slides, ankle pumps; 5/6:  heel raise, mini squat SLR; 05/30/20 - supine bridge 5/23 seated hamstring stretch    Consulted and Agree with Plan of Care Patient           Patient will benefit from skilled therapeutic intervention in order to improve the following deficits and impairments:  Abnormal gait,Decreased range of motion,Difficulty walking,Decreased endurance,Decreased activity tolerance,Impaired perceived functional ability,Pain,Decreased balance,Impaired flexibility,Improper body mechanics,Increased edema,Decreased strength,Decreased mobility  Visit Diagnosis: Left knee pain, unspecified chronicity  Other abnormalities of gait and mobility  Muscle weakness (generalized)  Stiffness of left knee, not elsewhere classified  Other symptoms and signs involving the musculoskeletal system     Problem List Patient Active Problem List   Diagnosis Date Noted  . S/P total knee arthroplasty, left 05/10/2020  . Essential hypertension, benign 10/29/2017  . Abdominal pain, epigastric 10/29/2017  . Postcoital bleeding 04/02/2014  . Dyspareunia in female 04/02/2014    2:54 PM, 06/08/20 Mearl Latin PT, DPT Physical Therapist at [place], [place], [address] Phone: [phone]   Fax:  [phone]  Name: ELDORA NAPP MRN: [id number] Date of Birth: September 24, 1950

## 2020-06-10 ENCOUNTER — Encounter (HOSPITAL_COMMUNITY): Payer: Medicare PPO

## 2020-06-14 ENCOUNTER — Ambulatory Visit (HOSPITAL_COMMUNITY): Payer: Medicare PPO

## 2020-06-14 ENCOUNTER — Encounter (HOSPITAL_COMMUNITY): Payer: Self-pay

## 2020-06-14 ENCOUNTER — Other Ambulatory Visit: Payer: Self-pay

## 2020-06-14 DIAGNOSIS — M25562 Pain in left knee: Secondary | ICD-10-CM | POA: Diagnosis not present

## 2020-06-14 DIAGNOSIS — R2689 Other abnormalities of gait and mobility: Secondary | ICD-10-CM | POA: Diagnosis not present

## 2020-06-14 DIAGNOSIS — M25662 Stiffness of left knee, not elsewhere classified: Secondary | ICD-10-CM | POA: Diagnosis not present

## 2020-06-14 DIAGNOSIS — M6281 Muscle weakness (generalized): Secondary | ICD-10-CM | POA: Diagnosis not present

## 2020-06-14 DIAGNOSIS — R29898 Other symptoms and signs involving the musculoskeletal system: Secondary | ICD-10-CM | POA: Diagnosis not present

## 2020-06-14 NOTE — Therapy (Signed)
[place], [place], [address] Phone: [phone]   Fax:  [phone]  Physical Therapy Treatment  Patient Details  Name: Patricia Sharp MRN: [id number] Date of Birth: September 30, 1950 Referring Provider (PT): Arty Baumgartner MD   Encounter Date: 06/14/2020   PT End of Session - 06/14/20 1333    Visit Number 10    Number of Visits 24    Date for PT Re-Evaluation 07/20/20    Authorization Type [employer] (no visit limit, auth required)    Authorization Time Period 12 visits requested 4/28-6/9 approved; 12 visits requested 5/25-7/6 - check auth    Authorization - Visit Number 10    Authorization - Number of Visits 12    Progress Note Due on Visit 19    PT Start Time 1315    PT Stop Time 1400    PT Time Calculation (min) 45 min    Activity Tolerance Patient tolerated treatment well    Behavior During Therapy [facility] for tasks assessed/performed           Past Medical History:  Diagnosis Date  . Anxiety   . Arthritis   . Depression   . Diabetes mellitus without complication ([place])   . Essential hypertension, benign 10/29/2017  . Hypertension     Past Surgical History:  Procedure Laterality Date  . ABDOMINAL HYSTERECTOMY    . ABDOMINAL SURGERY     to remove fibroid tumors  . BIOPSY  11/25/2017   Procedure: BIOPSY;  Surgeon: Rogene Houston, MD;  [facility];  Service: Endoscopy;;  gastric  . CHOLECYSTECTOMY    . COLONOSCOPY N/A 01/02/2013   Procedure: COLONOSCOPY;  Surgeon: Rogene Houston, MD;  [facility];  Service: Endoscopy;  Laterality: N/A;  825  . ESOPHAGOGASTRODUODENOSCOPY N/A 11/25/2017   Procedure: ESOPHAGOGASTRODUODENOSCOPY (EGD);  Surgeon: Rogene Houston, MD;  [facility];  Service: Endoscopy;  Laterality: N/A;  12:00-moved to 11/11 @ 9:55am per Lelon Frohlich  . REFRACTIVE SURGERY Bilateral   . TOTAL KNEE ARTHROPLASTY Left 05/10/2020   Procedure: TOTAL KNEE  ARTHROPLASTY;  Surgeon: Renette Butters, MD;  [facility];  Service: Orthopedics;  Laterality: Left;    There were no vitals filed for this visit.   Subjective Assessment - 06/14/20 1316    Subjective Pt arrived with thin compression hose, stated [facility].  Knee continues to swell, does c/o sensitivity  on medial aspect of knee.    Patient Stated Goals improve mobility and walking    Currently in Pain? No/denies    Pain Location Knee    Pain Orientation Left;Medial              [facility] PT Assessment - 06/14/20 0001      Assessment   Medical Diagnosis L TKA    Referring Provider (PT) Arty Baumgartner MD    Next MD Visit 06/15/20    Prior Therapy in hospital      Precautions   Precautions None                         [facility] Adult PT Treatment/Exercise - 06/14/20 0001      Knee/Hip Exercises: Stretches   Quad Stretch 3 reps;30 seconds    Quad Stretch Limitations prone with rope      Knee/Hip Exercises: Aerobic   Stationary Bike 5' seat 5 at EOS rocking forward and backwards  Knee/Hip Exercises: Standing   Terminal Knee Extension Left;10 reps;Theraband    Theraband Level (Terminal Knee Extension) Level 3 (Green)    Terminal Knee Extension Limitations 5" holds      Knee/Hip Exercises: Seated   Sit to Sand 10 reps;without UE support      Knee/Hip Exercises: Supine   Short Arc Quad Sets Left;15 reps    Short Arc Quad Sets Limitations 5" holds    Straight Leg Raises 15 reps    Straight Leg Raises Limitations quad set prior raise    Knee Extension AROM    Knee Extension Limitations 6    Knee Flexion AROM    Knee Flexion Limitations 103      Knee/Hip Exercises: Prone   Other Prone Exercises TKE 10x 5"      Manual Therapy   Manual Therapy Edema management    Manual therapy comments completed seperate from all other skilled interventions    Edema Management legs elevated, retro massage to reduce edema and pain                     PT Short Term Goals - 06/08/20 1431      PT SHORT TERM GOAL #1   Title Patient will be independent with HEP in order to improve functional outcomes.    Time 3    Period Weeks    Status Achieved    Target Date 06/02/20      PT SHORT TERM GOAL #2   Title Patient will report at least 25% improvement in symptoms for improved quality of life.    Time 3    Period Weeks    Status Achieved    Target Date 06/02/20             PT Long Term Goals - 05/30/20 1100      PT LONG TERM GOAL #1   Title Patient will report at least 75% improvement in symptoms for improved quality of life.    Time 6    Period Weeks    Status On-going      PT LONG TERM GOAL #2   Title Patient will improve FOTO score by at least 25 points in order to indicate improved tolerance to activity.    Time 6    Period Weeks    Status On-going      PT LONG TERM GOAL #3   Title Patient will be able to complete 5x STS in under 11.4 seconds in order to reduce the risk of falls.    Time 6    Period Weeks    Status On-going      PT LONG TERM GOAL #4   Title Patient will improve ROM for left knee extension/flexion to 0-115 degrees to improve squatting, and other functional mobility.    Time 6    Period Weeks    Status On-going                 Plan - 06/14/20 1409    Clinical Impression Statement Began session with manual decongestive techniques for edema control.  Pt arrived with thin compression garments, educated benefits with increased compression to assist with edema.  Shown butler to assist with donning.  Session focus with knee mobility, added TKE based exercises for quad strengthening in standing and prone as well as stretches.  AROM EOS 6-103 degrees.  MD apt scheduled for yesterday.    Personal Factors and Comorbidities Age;Fitness;Behavior Pattern;Past/Current Experience;Comorbidity 3+;Time since onset of  injury/illness/exacerbation    Comorbidities anxiety, DM, HTN     Examination-Activity Limitations Locomotion Level;Transfers;Bathing;Bend;Squat;Stairs;Stand;Lift;Hygiene/Grooming;Dressing;Carry    Examination-Participation Restrictions Lawyer;Shop;Cleaning;Meal Prep;Community Activity;Laundry    Stability/Clinical Decision Making Stable/Uncomplicated    Clinical Decision Making Low    Rehab Potential Good    PT Frequency 2x / week    PT Duration 6 weeks    PT Treatment/Interventions ADLs/Self Care Home Management;Aquatic Therapy;Cryotherapy;Electrical Stimulation;Moist Heat;Traction;DME Instruction;Ultrasound;Gait training;Stair training;Functional mobility training;Therapeutic activities;Therapeutic exercise;Balance training;Patient/family education;Manual techniques;Manual lymph drainage;Compression bandaging;Scar mobilization;Passive range of motion;Dry needling;Energy conservation;Splinting;Taping;Vasopneumatic Device;Spinal Manipulations;Joint Manipulations    PT Next Visit Plan f/u with compression stockings, continue with L knee mobility and strengthening, possibly add manual for edema/pain/mobility, hinge brace for 6 weeks due to MCL laxity. Static balance and gait progressions    PT Home Exercise Plan 4/28 heel prop, quad set, heel slides, ankle pumps; 5/6:  heel raise, mini squat SLR; 05/30/20 - supine bridge 5/23 seated hamstring stretch    Consulted and Agree with Plan of Care Patient           Patient will benefit from skilled therapeutic intervention in order to improve the following deficits and impairments:  Abnormal gait,Decreased range of motion,Difficulty walking,Decreased endurance,Decreased activity tolerance,Impaired perceived functional ability,Pain,Decreased balance,Impaired flexibility,Improper body mechanics,Increased edema,Decreased strength,Decreased mobility  Visit Diagnosis: Left knee pain, unspecified chronicity  Other abnormalities of gait and mobility  Muscle weakness (generalized)  Stiffness of left knee, not  elsewhere classified  Other symptoms and signs involving the musculoskeletal system     Problem List Patient Active Problem List   Diagnosis Date Noted  . S/P total knee arthroplasty, left 05/10/2020  . Essential hypertension, benign 10/29/2017  . Abdominal pain, epigastric 10/29/2017  . Postcoital bleeding 04/02/2014  . Dyspareunia in female 04/02/2014   Ihor Austin, LPTA/CLT; [place]  Aldona Lento 06/14/2020, 2:27 PM  [place], [place], [address] Phone: [phone]   Fax:  [phone]  Name: KARSON CHICAS MRN: [id number] Date of Birth: 01-16-1950

## 2020-06-16 ENCOUNTER — Other Ambulatory Visit: Payer: Self-pay

## 2020-06-16 ENCOUNTER — Ambulatory Visit (HOSPITAL_COMMUNITY): Payer: Medicare PPO | Attending: Orthopedic Surgery | Admitting: Physical Therapy

## 2020-06-16 DIAGNOSIS — R2689 Other abnormalities of gait and mobility: Secondary | ICD-10-CM | POA: Diagnosis not present

## 2020-06-16 DIAGNOSIS — M6281 Muscle weakness (generalized): Secondary | ICD-10-CM | POA: Diagnosis not present

## 2020-06-16 DIAGNOSIS — M25562 Pain in left knee: Secondary | ICD-10-CM | POA: Insufficient documentation

## 2020-06-16 DIAGNOSIS — M25662 Stiffness of left knee, not elsewhere classified: Secondary | ICD-10-CM | POA: Insufficient documentation

## 2020-06-16 DIAGNOSIS — R29898 Other symptoms and signs involving the musculoskeletal system: Secondary | ICD-10-CM | POA: Diagnosis not present

## 2020-06-16 NOTE — Therapy (Signed)
[place], [place], [address] Phone: [phone]   Fax:  [phone]  Physical Therapy Treatment  Patient Details  Name: Patricia Sharp MRN: [id number] Date of Birth: Oct 01, 1950 Referring Provider (PT): Arty Baumgartner MD   Encounter Date: 06/16/2020   PT End of Session - 06/16/20 1127    Visit Number 11    Number of Visits 24    Date for PT Re-Evaluation 07/20/20    Authorization Type [employer] (no visit limit, auth required)    Authorization Time Period 12 visits requested 4/28-6/9 approved; 12 visits approved 5/25-7/6    Authorization - Visit Number 1    Authorization - Number of Visits 12    Progress Note Due on Visit 19    PT Start Time 0920    PT Stop Time 1004    PT Time Calculation (min) 44 min    Activity Tolerance Patient tolerated treatment well    Behavior During Therapy [facility] for tasks assessed/performed           Past Medical History:  Diagnosis Date  . Anxiety   . Arthritis   . Depression   . Diabetes mellitus without complication ([place])   . Essential hypertension, benign 10/29/2017  . Hypertension     Past Surgical History:  Procedure Laterality Date  . ABDOMINAL HYSTERECTOMY    . ABDOMINAL SURGERY     to remove fibroid tumors  . BIOPSY  11/25/2017   Procedure: BIOPSY;  Surgeon: Rogene Houston, MD;  [facility];  Service: Endoscopy;;  gastric  . CHOLECYSTECTOMY    . COLONOSCOPY N/A 01/02/2013   Procedure: COLONOSCOPY;  Surgeon: Rogene Houston, MD;  [facility];  Service: Endoscopy;  Laterality: N/A;  825  . ESOPHAGOGASTRODUODENOSCOPY N/A 11/25/2017   Procedure: ESOPHAGOGASTRODUODENOSCOPY (EGD);  Surgeon: Rogene Houston, MD;  [facility];  Service: Endoscopy;  Laterality: N/A;  12:00-moved to 11/11 @ 9:55am per Lelon Frohlich  . REFRACTIVE SURGERY Bilateral   . TOTAL KNEE ARTHROPLASTY Left 05/10/2020   Procedure: TOTAL KNEE ARTHROPLASTY;  Surgeon:  Renette Butters, MD;  [facility];  Service: Orthopedics;  Laterality: Left;    There were no vitals filed for this visit.                      [facility] Adult PT Treatment/Exercise - 06/16/20 0001      Knee/Hip Exercises: Stretches   Active Hamstring Stretch Left;20 seconds;2 reps    Active Hamstring Stretch Limitations 12 inch step    Knee: Self-Stretch to increase Flexion Left;10 seconds    Knee: Self-Stretch Limitations x10, 12 inch step      Knee/Hip Exercises: Aerobic   Stationary Bike 5' seat 5 at BOS rocking forward and backwards      Knee/Hip Exercises: Standing   Knee Flexion Left;10 reps    Terminal Knee Extension Left;10 reps;Theraband    Theraband Level (Terminal Knee Extension) Level 3 (Green)    Terminal Knee Extension Limitations 5" holds      Knee/Hip Exercises: Supine   Quad Sets Left;15 reps    Heel Slides Left;10 reps    Knee Extension AROM    Knee Extension Limitations 6    Knee Flexion AROM    Knee Flexion Limitations 103      Manual Therapy   Manual Therapy Edema management;Soft tissue mobilization    Manual therapy comments completed seperate from all other skilled  interventions    Edema Management legs elevated, retro massage to reduce edema and pain    Soft tissue mobilization to adhesions along scar line and perimeter                    PT Short Term Goals - 06/08/20 1431      PT SHORT TERM GOAL #1   Title Patient will be independent with HEP in order to improve functional outcomes.    Time 3    Period Weeks    Status Achieved    Target Date 06/02/20      PT SHORT TERM GOAL #2   Title Patient will report at least 25% improvement in symptoms for improved quality of life.    Time 3    Period Weeks    Status Achieved    Target Date 06/02/20             PT Long Term Goals - 05/30/20 1100      PT LONG TERM GOAL #1   Title Patient will report at least 75% improvement in symptoms for improved quality of  life.    Time 6    Period Weeks    Status On-going      PT LONG TERM GOAL #2   Title Patient will improve FOTO score by at least 25 points in order to indicate improved tolerance to activity.    Time 6    Period Weeks    Status On-going      PT LONG TERM GOAL #3   Title Patient will be able to complete 5x STS in under 11.4 seconds in order to reduce the risk of falls.    Time 6    Period Weeks    Status On-going      PT LONG TERM GOAL #4   Title Patient will improve ROM for left knee extension/flexion to 0-115 degrees to improve squatting, and other functional mobility.    Time 6    Period Weeks    Status On-going                 Plan - 06/16/20 1133    Clinical Impression Statement Began on bike for ROM but unable to make full revolutions.  Focused on ROM and educated on edema and how this could possibly improve with increased compression (currently wearing 8-28mmHg).   Manual completed to reduce edema and adhesions perimeter of knee and instructed to self massage to reduce adhesions and scar tissue to improve ROM.  ROM remained no greater than 6-103 this session.    Personal Factors and Comorbidities Age;Fitness;Behavior Pattern;Past/Current Experience;Comorbidity 3+;Time since onset of injury/illness/exacerbation    Comorbidities anxiety, DM, HTN    Examination-Activity Limitations Locomotion Level;Transfers;Bathing;Bend;Squat;Stairs;Stand;Lift;Hygiene/Grooming;Dressing;Carry    Examination-Participation Restrictions Lawyer;Shop;Cleaning;Meal Prep;Community Activity;Laundry    Stability/Clinical Decision Making Stable/Uncomplicated    Rehab Potential Good    PT Frequency 2x / week    PT Duration 6 weeks    PT Treatment/Interventions ADLs/Self Care Home Management;Aquatic Therapy;Cryotherapy;Electrical Stimulation;Moist Heat;Traction;DME Instruction;Ultrasound;Gait training;Stair training;Functional mobility training;Therapeutic activities;Therapeutic  exercise;Balance training;Patient/family education;Manual techniques;Manual lymph drainage;Compression bandaging;Scar mobilization;Passive range of motion;Dry needling;Energy conservation;Splinting;Taping;Vasopneumatic Device;Spinal Manipulations;Joint Manipulations    PT Next Visit Plan Continue with L knee mobility and strengthening and manual for edema/pain/mobility.    PT Home Exercise Plan 4/28 heel prop, quad set, heel slides, ankle pumps; 5/6:  heel raise, mini squat SLR; 05/30/20 - supine bridge 5/23 seated hamstring stretch    Consulted and Agree with Plan  of Care Patient           Patient will benefit from skilled therapeutic intervention in order to improve the following deficits and impairments:  Abnormal gait,Decreased range of motion,Difficulty walking,Decreased endurance,Decreased activity tolerance,Impaired perceived functional ability,Pain,Decreased balance,Impaired flexibility,Improper body mechanics,Increased edema,Decreased strength,Decreased mobility  Visit Diagnosis: No diagnosis found.     Problem List Patient Active Problem List   Diagnosis Date Noted  . S/P total knee arthroplasty, left 05/10/2020  . Essential hypertension, benign 10/29/2017  . Abdominal pain, epigastric 10/29/2017  . Postcoital bleeding 04/02/2014  . Dyspareunia in female 04/02/2014   Teena Irani, PTA/CLT [phone] Teena Irani 06/16/2020, 11:36 AM  [place] [address] [place], [place], [address] Phone: [phone]   Fax:  [phone]  Name: Patricia Sharp MRN: [id number] Date of Birth: 12/08/1950

## 2020-06-17 ENCOUNTER — Encounter (HOSPITAL_COMMUNITY): Payer: Medicare PPO

## 2020-06-20 ENCOUNTER — Other Ambulatory Visit: Payer: Self-pay

## 2020-06-20 ENCOUNTER — Ambulatory Visit (HOSPITAL_COMMUNITY): Payer: Medicare PPO | Admitting: Physical Therapy

## 2020-06-20 ENCOUNTER — Encounter (HOSPITAL_COMMUNITY): Payer: Self-pay | Admitting: Physical Therapy

## 2020-06-20 DIAGNOSIS — R29898 Other symptoms and signs involving the musculoskeletal system: Secondary | ICD-10-CM | POA: Diagnosis not present

## 2020-06-20 DIAGNOSIS — M25562 Pain in left knee: Secondary | ICD-10-CM

## 2020-06-20 DIAGNOSIS — M6281 Muscle weakness (generalized): Secondary | ICD-10-CM

## 2020-06-20 DIAGNOSIS — R2689 Other abnormalities of gait and mobility: Secondary | ICD-10-CM

## 2020-06-20 DIAGNOSIS — M25662 Stiffness of left knee, not elsewhere classified: Secondary | ICD-10-CM | POA: Diagnosis not present

## 2020-06-20 NOTE — Therapy (Signed)
[place], [place], [address] Phone: [phone]   Fax:  [phone]  Physical Therapy Treatment  Patient Details  Name: Patricia Sharp MRN: [id number] Date of Birth: December 22, 1950 Referring Provider (PT): Arty Baumgartner MD   Encounter Date: 06/20/2020   PT End of Session - 06/20/20 0914    Visit Number 12    Number of Visits 24    Date for PT Re-Evaluation 07/20/20    Authorization Type [employer] (no visit limit, auth required)    Authorization Time Period 12 visits requested 4/28-6/9 approved; 12 visits approved 5/25-7/6    Authorization - Visit Number 2    Authorization - Number of Visits 12    Progress Note Due on Visit 19    PT Start Time 0915    PT Stop Time 0955    PT Time Calculation (min) 40 min    Activity Tolerance Patient tolerated treatment well    Behavior During Therapy [facility] for tasks assessed/performed           Past Medical History:  Diagnosis Date  . Anxiety   . Arthritis   . Depression   . Diabetes mellitus without complication ([place])   . Essential hypertension, benign 10/29/2017  . Hypertension     Past Surgical History:  Procedure Laterality Date  . ABDOMINAL HYSTERECTOMY    . ABDOMINAL SURGERY     to remove fibroid tumors  . BIOPSY  11/25/2017   Procedure: BIOPSY;  Surgeon: Rogene Houston, MD;  [facility];  Service: Endoscopy;;  gastric  . CHOLECYSTECTOMY    . COLONOSCOPY N/A 01/02/2013   Procedure: COLONOSCOPY;  Surgeon: Rogene Houston, MD;  [facility];  Service: Endoscopy;  Laterality: N/A;  825  . ESOPHAGOGASTRODUODENOSCOPY N/A 11/25/2017   Procedure: ESOPHAGOGASTRODUODENOSCOPY (EGD);  Surgeon: Rogene Houston, MD;  [facility];  Service: Endoscopy;  Laterality: N/A;  12:00-moved to 11/11 @ 9:55am per Lelon Frohlich  . REFRACTIVE SURGERY Bilateral   . TOTAL KNEE ARTHROPLASTY Left 05/10/2020   Procedure: TOTAL KNEE ARTHROPLASTY;  Surgeon:  Renette Butters, MD;  [facility];  Service: Orthopedics;  Laterality: Left;    There were no vitals filed for this visit.   Subjective Assessment - 06/20/20 0915    Subjective Patient states she might of did to many exercises yesterday because her knee was sore last night. She thinks swelling is going down.    Patient Stated Goals improve mobility and walking    Currently in Pain? No/denies                             [facility] Adult PT Treatment/Exercise - 06/20/20 0001      Knee/Hip Exercises: Stretches   Sports administrator 5 reps;20 seconds    Quad Stretch Limitations prone with rope    Gastroc Stretch Both;3 reps;30 seconds    Gastroc Stretch Limitations slantboard      Knee/Hip Exercises: Aerobic   Stationary Bike 5' seat 5 at BOS rocking forward and backwards      Knee/Hip Exercises: Standing   Terminal Knee Extension Left;10 reps;Theraband    Terminal Knee Extension Limitations 5-10 second holds, thick blue band    Lateral Step Up Left;2 sets;10 reps;Hand Hold: 2;Step Height: 4"    Forward Step Up Left;2 sets;10 reps;Step Height: 4"      Knee/Hip Exercises: Supine   Knee Extension AROM  Knee Extension Limitations 6    Knee Flexion AROM    Knee Flexion Limitations 106      Manual Therapy   Manual Therapy Muscle Energy Technique    Manual therapy comments completed seperate from all other skilled interventions    Muscle Energy Technique contract relax in seated for flexion                  PT Education - 06/20/20 0915    Education Details HEP, exercise mechanics    Person(s) Educated Patient    Methods Explanation;Demonstration    Comprehension Verbalized understanding;Returned demonstration            PT Short Term Goals - 06/08/20 1431      PT SHORT TERM GOAL #1   Title Patient will be independent with HEP in order to improve functional outcomes.    Time 3    Period Weeks    Status Achieved    Target Date 06/02/20      PT  SHORT TERM GOAL #2   Title Patient will report at least 25% improvement in symptoms for improved quality of life.    Time 3    Period Weeks    Status Achieved    Target Date 06/02/20             PT Long Term Goals - 05/30/20 1100      PT LONG TERM GOAL #1   Title Patient will report at least 75% improvement in symptoms for improved quality of life.    Time 6    Period Weeks    Status On-going      PT LONG TERM GOAL #2   Title Patient will improve FOTO score by at least 25 points in order to indicate improved tolerance to activity.    Time 6    Period Weeks    Status On-going      PT LONG TERM GOAL #3   Title Patient will be able to complete 5x STS in under 11.4 seconds in order to reduce the risk of falls.    Time 6    Period Weeks    Status On-going      PT LONG TERM GOAL #4   Title Patient will improve ROM for left knee extension/flexion to 0-115 degrees to improve squatting, and other functional mobility.    Time 6    Period Weeks    Status On-going                 Plan - 06/20/20 0914    Clinical Impression Statement Patient showing improving knee flexion to 106 today following bike but continues to lack 6 degrees of extension. Patient without change in knee flexion ROM following contract relax in seated or prone quad stretch. Patient requires frequent cueing for mechanics of step up exercise with fair carry over. Patient requires HHA for steps due to impaired strength and balance. Patient educated on heel prop for improving extension and is shown and she completes. Patient will continue to benefit from skilled physical therapy in order to reduce impairment and improve function.    Personal Factors and Comorbidities Age;Fitness;Behavior Pattern;Past/Current Experience;Comorbidity 3+;Time since onset of injury/illness/exacerbation    Comorbidities anxiety, DM, HTN    Examination-Activity Limitations Locomotion  Level;Transfers;Bathing;Bend;Squat;Stairs;Stand;Lift;Hygiene/Grooming;Dressing;Carry    Examination-Participation Restrictions Lawyer;Shop;Cleaning;Meal Prep;Community Activity;Laundry    Stability/Clinical Decision Making Stable/Uncomplicated    Rehab Potential Good    PT Frequency 2x / week    PT Duration 6  weeks    PT Treatment/Interventions ADLs/Self Care Home Management;Aquatic Therapy;Cryotherapy;Electrical Stimulation;Moist Heat;Traction;DME Instruction;Ultrasound;Gait training;Stair training;Functional mobility training;Therapeutic activities;Therapeutic exercise;Balance training;Patient/family education;Manual techniques;Manual lymph drainage;Compression bandaging;Scar mobilization;Passive range of motion;Dry needling;Energy conservation;Splinting;Taping;Vasopneumatic Device;Spinal Manipulations;Joint Manipulations    PT Next Visit Plan Continue with L knee mobility and strengthening and manual for edema/pain/mobility.    PT Home Exercise Plan 4/28 heel prop, quad set, heel slides, ankle pumps; 5/6:  heel raise, mini squat SLR; 05/30/20 - supine bridge 5/23 seated hamstring stretch 6/6 heel prop    Consulted and Agree with Plan of Care Patient           Patient will benefit from skilled therapeutic intervention in order to improve the following deficits and impairments:  Abnormal gait,Decreased range of motion,Difficulty walking,Decreased endurance,Decreased activity tolerance,Impaired perceived functional ability,Pain,Decreased balance,Impaired flexibility,Improper body mechanics,Increased edema,Decreased strength,Decreased mobility  Visit Diagnosis: Left knee pain, unspecified chronicity  Other abnormalities of gait and mobility  Muscle weakness (generalized)  Stiffness of left knee, not elsewhere classified  Other symptoms and signs involving the musculoskeletal system     Problem List Patient Active Problem List   Diagnosis Date Noted  . S/P total knee  arthroplasty, left 05/10/2020  . Essential hypertension, benign 10/29/2017  . Abdominal pain, epigastric 10/29/2017  . Postcoital bleeding 04/02/2014  . Dyspareunia in female 04/02/2014    10:01 AM, 06/20/20 Mearl Latin PT, DPT Physical Therapist at [place], [place], [address] Phone: [phone]   Fax:  [phone]  Name: Patricia Sharp MRN: [id number] Date of Birth: [birth year]

## 2020-06-22 ENCOUNTER — Ambulatory Visit (HOSPITAL_COMMUNITY): Payer: Medicare PPO | Admitting: Physical Therapy

## 2020-06-22 ENCOUNTER — Other Ambulatory Visit: Payer: Self-pay

## 2020-06-22 ENCOUNTER — Encounter (HOSPITAL_COMMUNITY): Payer: Self-pay | Admitting: Physical Therapy

## 2020-06-22 DIAGNOSIS — M25562 Pain in left knee: Secondary | ICD-10-CM

## 2020-06-22 DIAGNOSIS — R29898 Other symptoms and signs involving the musculoskeletal system: Secondary | ICD-10-CM | POA: Diagnosis not present

## 2020-06-22 DIAGNOSIS — M25662 Stiffness of left knee, not elsewhere classified: Secondary | ICD-10-CM | POA: Diagnosis not present

## 2020-06-22 DIAGNOSIS — R2689 Other abnormalities of gait and mobility: Secondary | ICD-10-CM | POA: Diagnosis not present

## 2020-06-22 DIAGNOSIS — M6281 Muscle weakness (generalized): Secondary | ICD-10-CM

## 2020-06-22 NOTE — Therapy (Signed)
[place], [place], [address] Phone: [phone]   Fax:  [phone]  Physical Therapy Treatment  Patient Details  Name: Patricia Sharp MRN: [id number] Date of Birth: 11/05/1950 Referring Provider (PT): Arty Baumgartner MD   Encounter Date: 06/22/2020   PT End of Session - 06/22/20 0916    Visit Number 13    Number of Visits 24    Date for PT Re-Evaluation 07/20/20    Authorization Type [employer] (no visit limit, auth required)    Authorization Time Period 12 visits requested 4/28-6/9 approved; 12 visits approved 5/25-7/6    Authorization - Visit Number 3    Authorization - Number of Visits 12    Progress Note Due on Visit 19    PT Start Time 0916    PT Stop Time 0955    PT Time Calculation (min) 39 min    Activity Tolerance Patient tolerated treatment well    Behavior During Therapy [facility] for tasks assessed/performed           Past Medical History:  Diagnosis Date  . Anxiety   . Arthritis   . Depression   . Diabetes mellitus without complication ([place])   . Essential hypertension, benign 10/29/2017  . Hypertension     Past Surgical History:  Procedure Laterality Date  . ABDOMINAL HYSTERECTOMY    . ABDOMINAL SURGERY     to remove fibroid tumors  . BIOPSY  11/25/2017   Procedure: BIOPSY;  Surgeon: Rogene Houston, MD;  [facility];  Service: Endoscopy;;  gastric  . CHOLECYSTECTOMY    . COLONOSCOPY N/A 01/02/2013   Procedure: COLONOSCOPY;  Surgeon: Rogene Houston, MD;  [facility];  Service: Endoscopy;  Laterality: N/A;  825  . ESOPHAGOGASTRODUODENOSCOPY N/A 11/25/2017   Procedure: ESOPHAGOGASTRODUODENOSCOPY (EGD);  Surgeon: Rogene Houston, MD;  [facility];  Service: Endoscopy;  Laterality: N/A;  12:00-moved to 11/11 @ 9:55am per Lelon Frohlich  . REFRACTIVE SURGERY Bilateral   . TOTAL KNEE ARTHROPLASTY Left 05/10/2020   Procedure: TOTAL KNEE ARTHROPLASTY;  Surgeon:  Renette Butters, MD;  [facility];  Service: Orthopedics;  Laterality: Left;    There were no vitals filed for this visit.   Subjective Assessment - 06/22/20 0916    Subjective Patient states continued stiffness and soreness. Her compression rubs on her scar.    Patient Stated Goals improve mobility and walking    Currently in Pain? No/denies                             [facility] Adult PT Treatment/Exercise - 06/22/20 0001      Knee/Hip Exercises: Stretches   Quad Stretch 5 reps;20 seconds    Quad Stretch Limitations prone with rope      Knee/Hip Exercises: Aerobic   Stationary Bike 5' seat 5 at BOS rocking forward and backwards      Knee/Hip Exercises: Standing   Lateral Step Up Left;2 sets;10 reps;Step Height: 6";Hand Hold: 1    Forward Step Up Left;2 sets;10 reps;Step Height: 6"    Other Standing Knee Exercises tandem stance 2x 30 seconds bilateral      Knee/Hip Exercises: Seated   Sit to Sand 10 reps;without UE support;3 sets      Knee/Hip Exercises: Supine   Knee Extension AROM    Knee Extension Limitations lacking 4    Knee Flexion AROM  Knee Flexion Limitations 106   107 following prone quad stretch     Manual Therapy   Manual Therapy Soft tissue mobilization    Manual therapy comments completed seperate from all other skilled interventions    Soft tissue mobilization scar mobilizations                  PT Education - 06/22/20 0916    Education Details HEP, scar mobs, Heel prop    Person(s) Educated Patient    Methods Explanation;Demonstration    Comprehension Verbalized understanding;Returned demonstration            PT Short Term Goals - 06/08/20 1431      PT SHORT TERM GOAL #1   Title Patient will be independent with HEP in order to improve functional outcomes.    Time 3    Period Weeks    Status Achieved    Target Date 06/02/20      PT SHORT TERM GOAL #2   Title Patient will report at least 25% improvement in  symptoms for improved quality of life.    Time 3    Period Weeks    Status Achieved    Target Date 06/02/20             PT Long Term Goals - 05/30/20 1100      PT LONG TERM GOAL #1   Title Patient will report at least 75% improvement in symptoms for improved quality of life.    Time 6    Period Weeks    Status On-going      PT LONG TERM GOAL #2   Title Patient will improve FOTO score by at least 25 points in order to indicate improved tolerance to activity.    Time 6    Period Weeks    Status On-going      PT LONG TERM GOAL #3   Title Patient will be able to complete 5x STS in under 11.4 seconds in order to reduce the risk of falls.    Time 6    Period Weeks    Status On-going      PT LONG TERM GOAL #4   Title Patient will improve ROM for left knee extension/flexion to 0-115 degrees to improve squatting, and other functional mobility.    Time 6    Period Weeks    Status On-going                 Plan - 06/22/20 0916    Clinical Impression Statement Began session with bike for dynamic warm up and ROM. Completed scar mobilizations and educated patient on completing to reduce sensitivity. Patient showing improving knee extension ROM to lacking 4 and is educated on continuing heel prop at home. Patient showing improving ease with step up exercise and is able to complete with minimal unilateral UE support on 4 inch step. Patient able to progress to 6 inch step. Patient given manual block for RLE for STS and is able complete with blue foam under RLE for LLE bias. Patient will continue to benefit from skilled physical therapy in order to reduce impairment and improve function.    Personal Factors and Comorbidities Age;Fitness;Behavior Pattern;Past/Current Experience;Comorbidity 3+;Time since onset of injury/illness/exacerbation    Comorbidities anxiety, DM, HTN    Examination-Activity Limitations Locomotion  Level;Transfers;Bathing;Bend;Squat;Stairs;Stand;Lift;Hygiene/Grooming;Dressing;Carry    Examination-Participation Restrictions Lawyer;Shop;Cleaning;Meal Prep;Community Activity;Laundry    Stability/Clinical Decision Making Stable/Uncomplicated    Rehab Potential Good    PT Frequency 2x /  week    PT Duration 6 weeks    PT Treatment/Interventions ADLs/Self Care Home Management;Aquatic Therapy;Cryotherapy;Electrical Stimulation;Moist Heat;Traction;DME Instruction;Ultrasound;Gait training;Stair training;Functional mobility training;Therapeutic activities;Therapeutic exercise;Balance training;Patient/family education;Manual techniques;Manual lymph drainage;Compression bandaging;Scar mobilization;Passive range of motion;Dry needling;Energy conservation;Splinting;Taping;Vasopneumatic Device;Spinal Manipulations;Joint Manipulations    PT Next Visit Plan Continue with L knee mobility and strengthening and manual for edema/pain/mobility.    PT Home Exercise Plan 4/28 heel prop, quad set, heel slides, ankle pumps; 5/6:  heel raise, mini squat SLR; 05/30/20 - supine bridge 5/23 seated hamstring stretch 6/6 heel prop    Consulted and Agree with Plan of Care Patient           Patient will benefit from skilled therapeutic intervention in order to improve the following deficits and impairments:  Abnormal gait,Decreased range of motion,Difficulty walking,Decreased endurance,Decreased activity tolerance,Impaired perceived functional ability,Pain,Decreased balance,Impaired flexibility,Improper body mechanics,Increased edema,Decreased strength,Decreased mobility  Visit Diagnosis: Left knee pain, unspecified chronicity  Other abnormalities of gait and mobility  Muscle weakness (generalized)  Stiffness of left knee, not elsewhere classified  Other symptoms and signs involving the musculoskeletal system     Problem List Patient Active Problem List   Diagnosis Date Noted  . S/P total knee  arthroplasty, left 05/10/2020  . Essential hypertension, benign 10/29/2017  . Abdominal pain, epigastric 10/29/2017  . Postcoital bleeding 04/02/2014  . Dyspareunia in female 04/02/2014   9:57 AM, 06/22/20 Mearl Latin PT, DPT Physical Therapist at [place], [place], [address] Phone: [phone]   Fax:  [phone]  Name: Patricia Sharp MRN: [id number] Date of Birth: [birth year]

## 2020-06-27 ENCOUNTER — Encounter (HOSPITAL_COMMUNITY): Payer: Self-pay | Admitting: Physical Therapy

## 2020-06-27 ENCOUNTER — Other Ambulatory Visit: Payer: Self-pay

## 2020-06-27 ENCOUNTER — Ambulatory Visit (HOSPITAL_COMMUNITY): Payer: Medicare PPO | Admitting: Physical Therapy

## 2020-06-27 DIAGNOSIS — R29898 Other symptoms and signs involving the musculoskeletal system: Secondary | ICD-10-CM | POA: Diagnosis not present

## 2020-06-27 DIAGNOSIS — M6281 Muscle weakness (generalized): Secondary | ICD-10-CM | POA: Diagnosis not present

## 2020-06-27 DIAGNOSIS — R2689 Other abnormalities of gait and mobility: Secondary | ICD-10-CM

## 2020-06-27 DIAGNOSIS — M25662 Stiffness of left knee, not elsewhere classified: Secondary | ICD-10-CM | POA: Diagnosis not present

## 2020-06-27 DIAGNOSIS — M25562 Pain in left knee: Secondary | ICD-10-CM

## 2020-06-27 NOTE — Therapy (Signed)
[place], [place], [address] Phone: [phone]   Fax:  [phone]  Physical Therapy Treatment  Patient Details  Name: Patricia Sharp MRN: [id number] Date of Birth: [birth year] Referring Provider (PT): Arty Baumgartner MD   Encounter Date: 06/27/2020   PT End of Session - 06/27/20 1312     Visit Number 14    Number of Visits 24    Date for PT Re-Evaluation 07/20/20    Authorization Type [employer] (no visit limit, auth required)    Authorization Time Period 12 visits requested 4/28-6/9 approved; 12 visits approved 5/25-7/6    Authorization - Visit Number 4    Authorization - Number of Visits 12    Progress Note Due on Visit 19    PT Start Time 1315    PT Stop Time 1355    PT Time Calculation (min) 40 min    Activity Tolerance Patient tolerated treatment well    Behavior During Therapy [facility] for tasks assessed/performed             Past Medical History:  Diagnosis Date   Anxiety    Arthritis    Depression    Diabetes mellitus without complication ([place])    Essential hypertension, benign 10/29/2017   Hypertension     Past Surgical History:  Procedure Laterality Date   ABDOMINAL HYSTERECTOMY     ABDOMINAL SURGERY     to remove fibroid tumors   BIOPSY  11/25/2017   Procedure: BIOPSY;  Surgeon: Rogene Houston, MD;  [facility];  Service: Endoscopy;;  gastric   CHOLECYSTECTOMY     COLONOSCOPY N/A 01/02/2013   Procedure: COLONOSCOPY;  Surgeon: Rogene Houston, MD;  [facility];  Service: Endoscopy;  Laterality: N/A;  825   ESOPHAGOGASTRODUODENOSCOPY N/A 11/25/2017   Procedure: ESOPHAGOGASTRODUODENOSCOPY (EGD);  Surgeon: Rogene Houston, MD;  [facility];  Service: Endoscopy;  Laterality: N/A;  12:00-moved to 11/11 @ 9:55am per Lelon Frohlich   REFRACTIVE SURGERY Bilateral    TOTAL KNEE ARTHROPLASTY Left 05/10/2020   Procedure: TOTAL KNEE ARTHROPLASTY;  Surgeon: Renette Butters, MD;  [facility];  Service: Orthopedics;  Laterality: Left;    There were no vitals filed for this visit.   Subjective Assessment - 06/27/20 1313     Subjective Patient states her knee is alright. Some days are better than others. her home exercises are going alright.    Patient Stated Goals improve mobility and walking    Currently in Pain? No/denies                               [facility] Adult PT Treatment/Exercise - 06/27/20 0001       Knee/Hip Exercises: Stretches   Sports administrator 5 reps;20 seconds    Quad Stretch Limitations prone with rope    Gastroc Stretch Both;3 reps;30 seconds    Gastroc Stretch Limitations slantboard      Knee/Hip Exercises: Aerobic   Stationary Bike 5' seat 5 at BOS rocking forward and backwards      Knee/Hip Exercises: Standing   Lateral Step Up Left;2 sets;10 reps;Step Height: 6";Hand Hold: 2    Lateral Step Up Limitations eccentric control    Forward Step Up Left;2 sets;15 reps;Hand Hold: 1;Step Height: 6"    Step Down Left;2 sets;10 reps;Hand Hold: 1;Step Height: 4"    Step Down Limitations eccentric control  Stairs 4RT 4 inch step      Knee/Hip Exercises: Seated   Sit to Sand 10 reps;without UE support;3 sets   foam under RLE     Knee/Hip Exercises: Supine   Knee Extension AROM    Knee Extension Limitations lacking 4    Knee Flexion AROM    Knee Flexion Limitations 110                    PT Education - 06/27/20 1312     Education Details HEP    Person(s) Educated Patient    Methods Explanation;Demonstration    Comprehension Verbalized understanding;Returned demonstration              PT Short Term Goals - 06/08/20 1431       PT SHORT TERM GOAL #1   Title Patient will be independent with HEP in order to improve functional outcomes.    Time 3    Period Weeks    Status Achieved    Target Date 06/02/20      PT SHORT TERM GOAL #2   Title Patient will report at least 25%  improvement in symptoms for improved quality of life.    Time 3    Period Weeks    Status Achieved    Target Date 06/02/20               PT Long Term Goals - 05/30/20 1100       PT LONG TERM GOAL #1   Title Patient will report at least 75% improvement in symptoms for improved quality of life.    Time 6    Period Weeks    Status On-going      PT LONG TERM GOAL #2   Title Patient will improve FOTO score by at least 25 points in order to indicate improved tolerance to activity.    Time 6    Period Weeks    Status On-going      PT LONG TERM GOAL #3   Title Patient will be able to complete 5x STS in under 11.4 seconds in order to reduce the risk of falls.    Time 6    Period Weeks    Status On-going      PT LONG TERM GOAL #4   Title Patient will improve ROM for left knee extension/flexion to 0-115 degrees to improve squatting, and other functional mobility.    Time 6    Period Weeks    Status On-going                   Plan - 06/27/20 1312     Clinical Impression Statement Began session with bike for dynamic warm up and ROM. Patient able to complete full revolutions on bike today showing improving flexion ROM. Patient demonstrating improving ROM from lacking 4 to 110 today. No improvement in ROM following prone quad stretch. Patient able to perform increased reps of step up and is showing improving LLE strength. Patient given cueing for eccentric control with lateral step down exercise and is able to complete from 6 inch step with fair control. Patient given cueing for alternating pattern with stairs and is able to complete with compensations on 4 inch step. Patient will continue to benefit from skilled physical therapy in order to reduce impairment and improve function.    Personal Factors and Comorbidities Age;Fitness;Behavior Pattern;Past/Current Experience;Comorbidity 3+;Time since onset of injury/illness/exacerbation    Comorbidities anxiety, DM, HTN  Examination-Activity Limitations Locomotion Level;Transfers;Bathing;Bend;Squat;Stairs;Stand;Lift;Hygiene/Grooming;Dressing;Carry    Examination-Participation Restrictions Lawyer;Shop;Cleaning;Meal Prep;Community Activity;Laundry    Stability/Clinical Decision Making Stable/Uncomplicated    Rehab Potential Good    PT Frequency 2x / week    PT Duration 6 weeks    PT Treatment/Interventions ADLs/Self Care Home Management;Aquatic Therapy;Cryotherapy;Electrical Stimulation;Moist Heat;Traction;DME Instruction;Ultrasound;Gait training;Stair training;Functional mobility training;Therapeutic activities;Therapeutic exercise;Balance training;Patient/family education;Manual techniques;Manual lymph drainage;Compression bandaging;Scar mobilization;Passive range of motion;Dry needling;Energy conservation;Splinting;Taping;Vasopneumatic Device;Spinal Manipulations;Joint Manipulations    PT Next Visit Plan Continue with L knee mobility and strengthening and manual for edema/pain/mobility.    PT Home Exercise Plan 4/28 heel prop, quad set, heel slides, ankle pumps; 5/6:  heel raise, mini squat SLR; 05/30/20 - supine bridge 5/23 seated hamstring stretch 6/6 heel prop    Consulted and Agree with Plan of Care Patient             Patient will benefit from skilled therapeutic intervention in order to improve the following deficits and impairments:  Abnormal gait, Decreased range of motion, Difficulty walking, Decreased endurance, Decreased activity tolerance, Impaired perceived functional ability, Pain, Decreased balance, Impaired flexibility, Improper body mechanics, Increased edema, Decreased strength, Decreased mobility  Visit Diagnosis: Left knee pain, unspecified chronicity  Other abnormalities of gait and mobility  Muscle weakness (generalized)  Stiffness of left knee, not elsewhere classified  Other symptoms and signs involving the musculoskeletal system     Problem List Patient Active  Problem List   Diagnosis Date Noted   S/P total knee arthroplasty, left 05/10/2020   Essential hypertension, benign 10/29/2017   Abdominal pain, epigastric 10/29/2017   Postcoital bleeding 04/02/2014   Dyspareunia in female 04/02/2014   1:54 PM, 06/27/20 Mearl Latin PT, DPT Physical Therapist at [place], [place], [address] Phone: [phone]   Fax:  [phone]  Name: Patricia Sharp MRN: [id number] Date of Birth: Oct 08, 1950

## 2020-06-29 ENCOUNTER — Encounter (HOSPITAL_COMMUNITY): Payer: Self-pay | Admitting: Physical Therapy

## 2020-06-29 ENCOUNTER — Ambulatory Visit (HOSPITAL_COMMUNITY): Payer: Medicare PPO | Admitting: Physical Therapy

## 2020-06-29 ENCOUNTER — Other Ambulatory Visit: Payer: Self-pay

## 2020-06-29 DIAGNOSIS — R29898 Other symptoms and signs involving the musculoskeletal system: Secondary | ICD-10-CM

## 2020-06-29 DIAGNOSIS — M25662 Stiffness of left knee, not elsewhere classified: Secondary | ICD-10-CM | POA: Diagnosis not present

## 2020-06-29 DIAGNOSIS — M25562 Pain in left knee: Secondary | ICD-10-CM | POA: Diagnosis not present

## 2020-06-29 DIAGNOSIS — R2689 Other abnormalities of gait and mobility: Secondary | ICD-10-CM

## 2020-06-29 DIAGNOSIS — M6281 Muscle weakness (generalized): Secondary | ICD-10-CM

## 2020-06-29 NOTE — Therapy (Signed)
[place], [place], [address] Phone: [phone]   Fax:  [phone]  Physical Therapy Treatment  Patient Details  Name: Patricia Sharp MRN: [id number] Date of Birth: 08-01-1950 Referring Provider (PT): Arty Baumgartner MD   Encounter Date: 06/29/2020   PT End of Session - 06/29/20 1447     Visit Number 15    Number of Visits 24    Date for PT Re-Evaluation 07/20/20    Authorization Type [employer] (no visit limit, auth required)    Authorization Time Period 12 visits requested 4/28-6/9 approved; 12 visits approved 5/25-7/6    Authorization - Visit Number 5    Authorization - Number of Visits 12    Progress Note Due on Visit 19    PT Start Time 1435    PT Stop Time 1520    PT Time Calculation (min) 45 min    Activity Tolerance Patient tolerated treatment well    Behavior During Therapy [facility] for tasks assessed/performed             Past Medical History:  Diagnosis Date   Anxiety    Arthritis    Depression    Diabetes mellitus without complication ([place])    Essential hypertension, benign 10/29/2017   Hypertension     Past Surgical History:  Procedure Laterality Date   ABDOMINAL HYSTERECTOMY     ABDOMINAL SURGERY     to remove fibroid tumors   BIOPSY  11/25/2017   Procedure: BIOPSY;  Surgeon: Rogene Houston, MD;  [facility];  Service: Endoscopy;;  gastric   CHOLECYSTECTOMY     COLONOSCOPY N/A 01/02/2013   Procedure: COLONOSCOPY;  Surgeon: Rogene Houston, MD;  [facility];  Service: Endoscopy;  Laterality: N/A;  825   ESOPHAGOGASTRODUODENOSCOPY N/A 11/25/2017   Procedure: ESOPHAGOGASTRODUODENOSCOPY (EGD);  Surgeon: Rogene Houston, MD;  [facility];  Service: Endoscopy;  Laterality: N/A;  12:00-moved to 11/11 @ 9:55am per Lelon Frohlich   REFRACTIVE SURGERY Bilateral    TOTAL KNEE ARTHROPLASTY Left 05/10/2020   Procedure: TOTAL KNEE ARTHROPLASTY;  Surgeon: Renette Butters, MD;  [facility];  Service: Orthopedics;  Laterality: Left;    There were no vitals filed for this visit.   Subjective Assessment - 06/29/20 1446     Subjective Patient says she has been walking more. Hasn't done too much exercise today. She was running errands Pain is decreased. No pain currently.    Patient Stated Goals improve mobility and walking    Currently in Pain? No/denies                               [facility] Adult PT Treatment/Exercise - 06/29/20 0001       Knee/Hip Exercises: Stretches   Passive Hamstring Stretch Left;3 reps;30 seconds    Passive Hamstring Stretch Limitations on step    Gastroc Stretch Both;3 reps;30 seconds    Gastroc Stretch Limitations slantboard      Knee/Hip Exercises: Aerobic   Recumbent Bike 5 min dynamaic warmup, seat 6, full revolutions      Knee/Hip Exercises: Standing   Heel Raises 2 sets;10 reps    Forward Step Up Left;1 set;15 reps;Hand Hold: 1;Step Height: 6"    Step Down Left;Step Height: 4";Step Height: 6";Hand Hold: 2;10 reps;2 sets   1st set  set 4 inch, 2nd set 6 inch   Other Standing  Knee Exercises tandem stance 2x 30 seconds bilateral      Knee/Hip Exercises: Seated   Sit to Sand 1 set;15 reps;without UE support      Knee/Hip Exercises: Supine   Heel Slides Left;10 reps;AAROM    Knee Extension AROM    Knee Extension Limitations lacking 4    Knee Flexion AROM    Knee Flexion Limitations 105                      PT Short Term Goals - 06/08/20 1431       PT SHORT TERM GOAL #1   Title Patient will be independent with HEP in order to improve functional outcomes.    Time 3    Period Weeks    Status Achieved    Target Date 06/02/20      PT SHORT TERM GOAL #2   Title Patient will report at least 25% improvement in symptoms for improved quality of life.    Time 3    Period Weeks    Status Achieved    Target Date 06/02/20               PT Long Term Goals - 05/30/20  1100       PT LONG TERM GOAL #1   Title Patient will report at least 75% improvement in symptoms for improved quality of life.    Time 6    Period Weeks    Status On-going      PT LONG TERM GOAL #2   Title Patient will improve FOTO score by at least 25 points in order to indicate improved tolerance to activity.    Time 6    Period Weeks    Status On-going      PT LONG TERM GOAL #3   Title Patient will be able to complete 5x STS in under 11.4 seconds in order to reduce the risk of falls.    Time 6    Period Weeks    Status On-going      PT LONG TERM GOAL #4   Title Patient will improve ROM for left knee extension/flexion to 0-115 degrees to improve squatting, and other functional mobility.    Time 6    Period Weeks    Status On-going                   Plan - 06/29/20 1610     Clinical Impression Statement Patient making good progress toward goals overall. Patient still limited by mild AROM deficits, but shows improving functional mobility and ability to ambulate stairs. Increased step-down height to 6 inch box with no complaints. Educated patient of specific HEP exercise for reaching remaining AROM goals. Patient will continue to benefit from skilled therapy services to reduce deficits and improve functional ability.    Personal Factors and Comorbidities Age;Fitness;Behavior Pattern;Past/Current Experience;Comorbidity 3+;Time since onset of injury/illness/exacerbation    Comorbidities anxiety, DM, HTN    Examination-Activity Limitations Locomotion Level;Transfers;Bathing;Bend;Squat;Stairs;Stand;Lift;Hygiene/Grooming;Dressing;Carry    Examination-Participation Restrictions Lawyer;Shop;Cleaning;Meal Prep;Community Activity;Laundry    Stability/Clinical Decision Making Stable/Uncomplicated    Rehab Potential Good    PT Frequency 2x / week    PT Duration 6 weeks    PT Treatment/Interventions ADLs/Self Care Home Management;Aquatic Therapy;Cryotherapy;Electrical  Stimulation;Moist Heat;Traction;DME Instruction;Ultrasound;Gait training;Stair training;Functional mobility training;Therapeutic activities;Therapeutic exercise;Balance training;Patient/family education;Manual techniques;Manual lymph drainage;Compression bandaging;Scar mobilization;Passive range of motion;Dry needling;Energy conservation;Splinting;Taping;Vasopneumatic Device;Spinal Manipulations;Joint Manipulations    PT Next Visit Plan Continue with L knee mobility and strengthening.  Hip abduciton strengthening    PT Home Exercise Plan 4/28 heel prop, quad set, heel slides, ankle pumps; 5/6:  heel raise, mini squat SLR; 05/30/20 - supine bridge 5/23 seated hamstring stretch 6/6 heel prop    Consulted and Agree with Plan of Care Patient             Patient will benefit from skilled therapeutic intervention in order to improve the following deficits and impairments:  Abnormal gait, Decreased range of motion, Difficulty walking, Decreased endurance, Decreased activity tolerance, Impaired perceived functional ability, Pain, Decreased balance, Impaired flexibility, Improper body mechanics, Increased edema, Decreased strength, Decreased mobility  Visit Diagnosis: Left knee pain, unspecified chronicity  Other abnormalities of gait and mobility  Muscle weakness (generalized)  Stiffness of left knee, not elsewhere classified  Other symptoms and signs involving the musculoskeletal system     Problem List Patient Active Problem List   Diagnosis Date Noted   S/P total knee arthroplasty, left 05/10/2020   Essential hypertension, benign 10/29/2017   Abdominal pain, epigastric 10/29/2017   Postcoital bleeding 04/02/2014   Dyspareunia in female 04/02/2014   4:12 PM, 06/29/20 Josue Hector PT DPT  Physical Therapist with [facility]  [phone] [place] [address] [place], [place], [address] Phone: [phone]    Fax:  [phone]  Name: Patricia Sharp MRN: [id number] Date of Birth: [birth year]

## 2020-07-04 ENCOUNTER — Ambulatory Visit (HOSPITAL_COMMUNITY): Payer: Medicare PPO | Admitting: Physical Therapy

## 2020-07-04 ENCOUNTER — Encounter (HOSPITAL_COMMUNITY): Payer: Self-pay | Admitting: Physical Therapy

## 2020-07-04 ENCOUNTER — Other Ambulatory Visit: Payer: Self-pay

## 2020-07-04 DIAGNOSIS — R29898 Other symptoms and signs involving the musculoskeletal system: Secondary | ICD-10-CM | POA: Diagnosis not present

## 2020-07-04 DIAGNOSIS — M6281 Muscle weakness (generalized): Secondary | ICD-10-CM

## 2020-07-04 DIAGNOSIS — M25662 Stiffness of left knee, not elsewhere classified: Secondary | ICD-10-CM

## 2020-07-04 DIAGNOSIS — M25562 Pain in left knee: Secondary | ICD-10-CM

## 2020-07-04 DIAGNOSIS — R2689 Other abnormalities of gait and mobility: Secondary | ICD-10-CM | POA: Diagnosis not present

## 2020-07-04 NOTE — Therapy (Signed)
[place], [place], [address] Phone: [phone]   Fax:  [phone]  Physical Therapy Treatment  Patient Details  Name: Patricia Sharp MRN: [id number] Date of Birth: 1950/10/10 Referring Provider (PT): Arty Baumgartner MD   Encounter Date: 07/04/2020   PT End of Session - 07/04/20 1001     Visit Number 16    Number of Visits 24    Date for PT Re-Evaluation 07/20/20    Authorization Type [employer] (no visit limit, auth required)    Authorization Time Period 12 visits requested 4/28-6/9 approved; 12 visits approved 5/25-7/6    Authorization - Visit Number 6    Authorization - Number of Visits 12    Progress Note Due on Visit 19    PT Start Time 1001    PT Stop Time 1040    PT Time Calculation (min) 39 min    Activity Tolerance Patient tolerated treatment well    Behavior During Therapy [facility] for tasks assessed/performed             Past Medical History:  Diagnosis Date   Anxiety    Arthritis    Depression    Diabetes mellitus without complication ([place])    Essential hypertension, benign 10/29/2017   Hypertension     Past Surgical History:  Procedure Laterality Date   ABDOMINAL HYSTERECTOMY     ABDOMINAL SURGERY     to remove fibroid tumors   BIOPSY  11/25/2017   Procedure: BIOPSY;  Surgeon: Rogene Houston, MD;  [facility];  Service: Endoscopy;;  gastric   CHOLECYSTECTOMY     COLONOSCOPY N/A 01/02/2013   Procedure: COLONOSCOPY;  Surgeon: Rogene Houston, MD;  [facility];  Service: Endoscopy;  Laterality: N/A;  825   ESOPHAGOGASTRODUODENOSCOPY N/A 11/25/2017   Procedure: ESOPHAGOGASTRODUODENOSCOPY (EGD);  Surgeon: Rogene Houston, MD;  [facility];  Service: Endoscopy;  Laterality: N/A;  12:00-moved to 11/11 @ 9:55am per Lelon Frohlich   REFRACTIVE SURGERY Bilateral    TOTAL KNEE ARTHROPLASTY Left 05/10/2020   Procedure: TOTAL KNEE ARTHROPLASTY;  Surgeon: Renette Butters, MD;  [facility]: [facility];  Service: Orthopedics;  Laterality: Left;    There were no vitals filed for this visit.   Subjective Assessment - 07/04/20 1002     Subjective Patient states she had a good time on her trip. She was able to walk alot over the weekend and was not able to exercise as much.    Patient Stated Goals improve mobility and walking    Currently in Pain? No/denies                               [facility] Adult PT Treatment/Exercise - 07/04/20 0001       Knee/Hip Exercises: Stretches   Sports administrator 5 reps;20 seconds    Quad Stretch Limitations prone with rope    Press photographer Both;3 reps;30 seconds    Gastroc Stretch Limitations slantboard      Knee/Hip Exercises: Aerobic   Recumbent Bike 5 min dynamaic warmup, seat 5, full revolutions      Knee/Hip Exercises: Standing   Forward Step Up Left;15 reps;Hand Hold: 1;Step Height: 6";2 sets    Forward Step Up Limitations 2nd set with contralateral knee drive    Step Down Left;2 sets;10 reps;Hand Hold: 2;Step Height: 6"    Step Down Limitations eccentric control  Stairs 4RT 7 inch step    SLS with Vectors 5x 5 second holds    Other Standing Knee Exercises lateral stepping 6x 10 feet      Knee/Hip Exercises: Seated   Sit to Sand 3 sets;10 reps;without UE support   blue foam under RLE     Knee/Hip Exercises: Supine   Knee Extension AROM    Knee Extension Limitations lacking 5    Knee Flexion AROM    Knee Flexion Limitations 110                    PT Education - 07/04/20 1002     Education Details HEP    Person(s) Educated Patient    Methods Explanation;Demonstration    Comprehension Verbalized understanding;Returned demonstration              PT Short Term Goals - 06/08/20 1431       PT SHORT TERM GOAL #1   Title Patient will be independent with HEP in order to improve functional outcomes.    Time 3    Period Weeks    Status Achieved    Target Date 06/02/20       PT SHORT TERM GOAL #2   Title Patient will report at least 25% improvement in symptoms for improved quality of life.    Time 3    Period Weeks    Status Achieved    Target Date 06/02/20               PT Long Term Goals - 05/30/20 1100       PT LONG TERM GOAL #1   Title Patient will report at least 75% improvement in symptoms for improved quality of life.    Time 6    Period Weeks    Status On-going      PT LONG TERM GOAL #2   Title Patient will improve FOTO score by at least 25 points in order to indicate improved tolerance to activity.    Time 6    Period Weeks    Status On-going      PT LONG TERM GOAL #3   Title Patient will be able to complete 5x STS in under 11.4 seconds in order to reduce the risk of falls.    Time 6    Period Weeks    Status On-going      PT LONG TERM GOAL #4   Title Patient will improve ROM for left knee extension/flexion to 0-115 degrees to improve squatting, and other functional mobility.    Time 6    Period Weeks    Status On-going                   Plan - 07/04/20 1001     Clinical Impression Statement Patient showing improving ability to complete full oscillations on bike with seat closer. Patient showing ROM from lacking 5 to 110 today. Patient showing no change in ROM following quad stretch. Patient given cueing for eccentric control with forward step down but continues to require bilateral UE support. Patient able to navigate increased height stairs today but continues to show decreased eccentric strength and motor control. She requires unilateral UE support with SLS vectors for impaired balance. Patient will continue to benefit from skilled physical therapy in order to reduce impairment and improve function.    Personal Factors and Comorbidities Age;Fitness;Behavior Pattern;Past/Current Experience;Comorbidity 3+;Time since onset of injury/illness/exacerbation    Comorbidities anxiety, DM, HTN  Examination-Activity  Limitations Locomotion Level;Transfers;Bathing;Bend;Squat;Stairs;Stand;Lift;Hygiene/Grooming;Dressing;Carry    Examination-Participation Restrictions Lawyer;Shop;Cleaning;Meal Prep;Community Activity;Laundry    Stability/Clinical Decision Making Stable/Uncomplicated    Rehab Potential Good    PT Frequency 2x / week    PT Duration 6 weeks    PT Treatment/Interventions ADLs/Self Care Home Management;Aquatic Therapy;Cryotherapy;Electrical Stimulation;Moist Heat;Traction;DME Instruction;Ultrasound;Gait training;Stair training;Functional mobility training;Therapeutic activities;Therapeutic exercise;Balance training;Patient/family education;Manual techniques;Manual lymph drainage;Compression bandaging;Scar mobilization;Passive range of motion;Dry needling;Energy conservation;Splinting;Taping;Vasopneumatic Device;Spinal Manipulations;Joint Manipulations    PT Next Visit Plan Continue with L knee mobility and strengthening. Hip abduciton strengthening    PT Home Exercise Plan 4/28 heel prop, quad set, heel slides, ankle pumps; 5/6:  heel raise, mini squat SLR; 05/30/20 - supine bridge 5/23 seated hamstring stretch 6/6 heel prop    Consulted and Agree with Plan of Care Patient             Patient will benefit from skilled therapeutic intervention in order to improve the following deficits and impairments:  Abnormal gait, Decreased range of motion, Difficulty walking, Decreased endurance, Decreased activity tolerance, Impaired perceived functional ability, Pain, Decreased balance, Impaired flexibility, Improper body mechanics, Increased edema, Decreased strength, Decreased mobility  Visit Diagnosis: Left knee pain, unspecified chronicity  Other abnormalities of gait and mobility  Muscle weakness (generalized)  Stiffness of left knee, not elsewhere classified  Other symptoms and signs involving the musculoskeletal system     Problem List Patient Active Problem List   Diagnosis  Date Noted   S/P total knee arthroplasty, left 05/10/2020   Essential hypertension, benign 10/29/2017   Abdominal pain, epigastric 10/29/2017   Postcoital bleeding 04/02/2014   Dyspareunia in female 04/02/2014     10:38 AM, 07/04/20 Mearl Latin PT, DPT Physical Therapist at [place], [place], [address] Phone: [phone]   Fax:  [phone]  Name: Patricia Sharp MRN: [id number] Date of Birth: Feb 01, 1950

## 2020-07-06 ENCOUNTER — Encounter (HOSPITAL_COMMUNITY): Payer: Self-pay | Admitting: Physical Therapy

## 2020-07-06 ENCOUNTER — Ambulatory Visit (HOSPITAL_COMMUNITY): Payer: Medicare PPO | Admitting: Physical Therapy

## 2020-07-06 ENCOUNTER — Other Ambulatory Visit: Payer: Self-pay

## 2020-07-06 DIAGNOSIS — M6281 Muscle weakness (generalized): Secondary | ICD-10-CM | POA: Diagnosis not present

## 2020-07-06 DIAGNOSIS — M25662 Stiffness of left knee, not elsewhere classified: Secondary | ICD-10-CM | POA: Diagnosis not present

## 2020-07-06 DIAGNOSIS — R29898 Other symptoms and signs involving the musculoskeletal system: Secondary | ICD-10-CM | POA: Diagnosis not present

## 2020-07-06 DIAGNOSIS — M25562 Pain in left knee: Secondary | ICD-10-CM

## 2020-07-06 DIAGNOSIS — R2689 Other abnormalities of gait and mobility: Secondary | ICD-10-CM | POA: Diagnosis not present

## 2020-07-06 NOTE — Therapy (Signed)
[place], [place], [address] Phone: [phone]   Fax:  [phone]  Physical Therapy Treatment  Patient Details  Name: Patricia Sharp MRN: [id number] Date of Birth: 1950/04/23 Referring Provider (PT): Arty Baumgartner MD   Encounter Date: 07/06/2020   PT End of Session - 07/06/20 1047     Visit Number 17    Number of Visits 24    Date for PT Re-Evaluation 07/20/20    Authorization Type [employer] (no visit limit, auth required)    Authorization Time Period 12 visits requested 4/28-6/9 approved; 12 visits approved 5/25-7/6    Authorization - Visit Number 7    Authorization - Number of Visits 12    Progress Note Due on Visit 19    PT Start Time 1245    PT Stop Time 1127    PT Time Calculation (min) 40 min    Activity Tolerance Patient tolerated treatment well    Behavior During Therapy [facility] for tasks assessed/performed             Past Medical History:  Diagnosis Date   Anxiety    Arthritis    Depression    Diabetes mellitus without complication ([place])    Essential hypertension, benign 10/29/2017   Hypertension     Past Surgical History:  Procedure Laterality Date   ABDOMINAL HYSTERECTOMY     ABDOMINAL SURGERY     to remove fibroid tumors   BIOPSY  11/25/2017   Procedure: BIOPSY;  Surgeon: Rogene Houston, MD;  [facility];  Service: Endoscopy;;  gastric   CHOLECYSTECTOMY     COLONOSCOPY N/A 01/02/2013   Procedure: COLONOSCOPY;  Surgeon: Rogene Houston, MD;  [facility];  Service: Endoscopy;  Laterality: N/A;  825   ESOPHAGOGASTRODUODENOSCOPY N/A 11/25/2017   Procedure: ESOPHAGOGASTRODUODENOSCOPY (EGD);  Surgeon: Rogene Houston, MD;  [facility];  Service: Endoscopy;  Laterality: N/A;  12:00-moved to 11/11 @ 9:55am per Lelon Frohlich   REFRACTIVE SURGERY Bilateral    TOTAL KNEE ARTHROPLASTY Left 05/10/2020   Procedure: TOTAL KNEE ARTHROPLASTY;  Surgeon: Renette Butters, MD;  [facility];  Service: Orthopedics;  Laterality: Left;    There were no vitals filed for this visit.   Subjective Assessment - 07/06/20 1048     Subjective Patient states she is having some swelling and soreness. She continues to have tightness. Aching in the tibial region.    Patient Stated Goals improve mobility and walking    Currently in Pain? No/denies                               [facility] Adult PT Treatment/Exercise - 07/06/20 0001       Knee/Hip Exercises: Stretches   Sports administrator 5 reps;20 seconds    Quad Stretch Limitations prone with rope      Knee/Hip Exercises: Aerobic   Recumbent Bike 5 min dynamaic warmup, seat 5, full revolutions      Knee/Hip Exercises: Standing   Forward Lunges Left;10 reps;3 sets    Forward Lunges Limitations 1st set with nothing, 2nd set on 6 inch step with blue foam, and 3rd set lunge transitions to/from 2inch step and blue foam on step    Forward Step Up Both;2 sets;10 reps;Hand Hold: 1;Step Height: 6"    Forward Step Up Limitations contralateral knee drive      Knee/Hip Exercises: Supine  Heel Slides Limitations 5 x 10 second holds with PT overpressure    Knee Extension AROM    Knee Extension Limitations lacking 3    Knee Flexion AROM    Knee Flexion Limitations 110                    PT Education - 07/06/20 1048     Education Details HEP    Person(s) Educated Patient    Methods Explanation;Demonstration    Comprehension Verbalized understanding;Returned demonstration              PT Short Term Goals - 06/08/20 1431       PT SHORT TERM GOAL #1   Title Patient will be independent with HEP in order to improve functional outcomes.    Time 3    Period Weeks    Status Achieved    Target Date 06/02/20      PT SHORT TERM GOAL #2   Title Patient will report at least 25% improvement in symptoms for improved quality of life.    Time 3    Period Weeks    Status Achieved     Target Date 06/02/20               PT Long Term Goals - 05/30/20 1100       PT LONG TERM GOAL #1   Title Patient will report at least 75% improvement in symptoms for improved quality of life.    Time 6    Period Weeks    Status On-going      PT LONG TERM GOAL #2   Title Patient will improve FOTO score by at least 25 points in order to indicate improved tolerance to activity.    Time 6    Period Weeks    Status On-going      PT LONG TERM GOAL #3   Title Patient will be able to complete 5x STS in under 11.4 seconds in order to reduce the risk of falls.    Time 6    Period Weeks    Status On-going      PT LONG TERM GOAL #4   Title Patient will improve ROM for left knee extension/flexion to 0-115 degrees to improve squatting, and other functional mobility.    Time 6    Period Weeks    Status On-going                   Plan - 07/06/20 1047     Clinical Impression Statement Patient began session on bike for dynamic warm up and ROM and initially struggles with full oscillations but is able to as ROM improves. Patient lacking 3 extension and 110 flexion following bike. Patient given manual assist with prone quad stretch with strap and heel slides with overpressure improves to 112 following.  Patient requires some cueing for mechanics with step up exercise. She states difficulty with transfers to and from floor at home. Began with lunges which patient completes with fair mechanics. Transitioned to lunges with box under RLE with foam on top to initiate better mechanics and transfers to/from floor. Patient able to complete from 2 inch box with foam with good mechanics but requires UE support for strength deficits. Patient overall progressing well. Patient will continue to benefit from skilled physical therapy in order to reduce impairment and improve function.    Personal Factors and Comorbidities Age;Fitness;Behavior Pattern;Past/Current Experience;Comorbidity 3+;Time since  onset of injury/illness/exacerbation    Comorbidities anxiety,  DM, HTN    Examination-Activity Limitations Locomotion Level;Transfers;Bathing;Bend;Squat;Stairs;Stand;Lift;Hygiene/Grooming;Dressing;Carry    Examination-Participation Restrictions Lawyer;Shop;Cleaning;Meal Prep;Community Activity;Laundry    Stability/Clinical Decision Making Stable/Uncomplicated    Rehab Potential Good    PT Frequency 2x / week    PT Duration 6 weeks    PT Treatment/Interventions ADLs/Self Care Home Management;Aquatic Therapy;Cryotherapy;Electrical Stimulation;Moist Heat;Traction;DME Instruction;Ultrasound;Gait training;Stair training;Functional mobility training;Therapeutic activities;Therapeutic exercise;Balance training;Patient/family education;Manual techniques;Manual lymph drainage;Compression bandaging;Scar mobilization;Passive range of motion;Dry needling;Energy conservation;Splinting;Taping;Vasopneumatic Device;Spinal Manipulations;Joint Manipulations    PT Next Visit Plan Continue with L knee mobility and strengthening. Hip abduciton strengthening    PT Home Exercise Plan 4/28 heel prop, quad set, heel slides, ankle pumps; 5/6:  heel raise, mini squat SLR; 05/30/20 - supine bridge 5/23 seated hamstring stretch 6/6 heel prop    Consulted and Agree with Plan of Care Patient             Patient will benefit from skilled therapeutic intervention in order to improve the following deficits and impairments:  Abnormal gait, Decreased range of motion, Difficulty walking, Decreased endurance, Decreased activity tolerance, Impaired perceived functional ability, Pain, Decreased balance, Impaired flexibility, Improper body mechanics, Increased edema, Decreased strength, Decreased mobility  Visit Diagnosis: Left knee pain, unspecified chronicity  Other abnormalities of gait and mobility  Muscle weakness (generalized)  Stiffness of left knee, not elsewhere classified  Other symptoms and signs  involving the musculoskeletal system     Problem List Patient Active Problem List   Diagnosis Date Noted   S/P total knee arthroplasty, left 05/10/2020   Essential hypertension, benign 10/29/2017   Abdominal pain, epigastric 10/29/2017   Postcoital bleeding 04/02/2014   Dyspareunia in female 04/02/2014   11:32 AM, 07/06/20 Mearl Latin PT, DPT Physical Therapist at [place], [place], [address] Phone: [phone]   Fax:  [phone]  Name: Patricia Sharp MRN: [id number] Date of Birth: [birth year]

## 2020-07-11 ENCOUNTER — Encounter (HOSPITAL_COMMUNITY): Payer: Self-pay | Admitting: Physical Therapy

## 2020-07-11 ENCOUNTER — Other Ambulatory Visit: Payer: Self-pay

## 2020-07-11 ENCOUNTER — Ambulatory Visit (HOSPITAL_COMMUNITY): Payer: Medicare PPO | Admitting: Physical Therapy

## 2020-07-11 DIAGNOSIS — M25562 Pain in left knee: Secondary | ICD-10-CM | POA: Diagnosis not present

## 2020-07-11 DIAGNOSIS — M6281 Muscle weakness (generalized): Secondary | ICD-10-CM | POA: Diagnosis not present

## 2020-07-11 DIAGNOSIS — R2689 Other abnormalities of gait and mobility: Secondary | ICD-10-CM | POA: Diagnosis not present

## 2020-07-11 DIAGNOSIS — R29898 Other symptoms and signs involving the musculoskeletal system: Secondary | ICD-10-CM | POA: Diagnosis not present

## 2020-07-11 DIAGNOSIS — M25662 Stiffness of left knee, not elsewhere classified: Secondary | ICD-10-CM | POA: Diagnosis not present

## 2020-07-11 NOTE — Therapy (Signed)
[place], [place], [address] Phone: [phone]   Fax:  [phone]  Physical Therapy Treatment  Patient Details  Name: Patricia Sharp MRN: [id number] Date of Birth: [birth year] Referring Provider (PT): Arty Baumgartner MD   Encounter Date: 07/11/2020   PT End of Session - 07/11/20 0959     Visit Number 18    Number of Visits 24    Date for PT Re-Evaluation 07/20/20    Authorization Type [employer] (no visit limit, auth required)    Authorization Time Period 12 visits requested 4/28-6/9 approved; 12 visits approved 5/25-7/6    Authorization - Visit Number 8    Authorization - Number of Visits 12    Progress Note Due on Visit 19    PT Start Time 1000    PT Stop Time 1040    PT Time Calculation (min) 40 min    Activity Tolerance Patient tolerated treatment well    Behavior During Therapy [facility] for tasks assessed/performed             Past Medical History:  Diagnosis Date   Anxiety    Arthritis    Depression    Diabetes mellitus without complication ([place])    Essential hypertension, benign 10/29/2017   Hypertension     Past Surgical History:  Procedure Laterality Date   ABDOMINAL HYSTERECTOMY     ABDOMINAL SURGERY     to remove fibroid tumors   BIOPSY  11/25/2017   Procedure: BIOPSY;  Surgeon: Rogene Houston, MD;  [facility];  Service: Endoscopy;;  gastric   CHOLECYSTECTOMY     COLONOSCOPY N/A 01/02/2013   Procedure: COLONOSCOPY;  Surgeon: Rogene Houston, MD;  [facility];  Service: Endoscopy;  Laterality: N/A;  825   ESOPHAGOGASTRODUODENOSCOPY N/A 11/25/2017   Procedure: ESOPHAGOGASTRODUODENOSCOPY (EGD);  Surgeon: Rogene Houston, MD;  [facility];  Service: Endoscopy;  Laterality: N/A;  12:00-moved to 11/11 @ 9:55am per Lelon Frohlich   REFRACTIVE SURGERY Bilateral    TOTAL KNEE ARTHROPLASTY Left 05/10/2020   Procedure: TOTAL KNEE ARTHROPLASTY;  Surgeon: Renette Butters, MD;  [facility];  Service: Orthopedics;  Laterality: Left;    There were no vitals filed for this visit.   Subjective Assessment - 07/11/20 1000     Subjective Patient states soreness in the back of the knee and swelling. Bending isnt where it should be.    Patient Stated Goals improve mobility and walking    Currently in Pain? No/denies                               [facility] Adult PT Treatment/Exercise - 07/11/20 0001       Knee/Hip Exercises: Stretches   Gastroc Stretch Both;3 reps;30 seconds    Gastroc Stretch Limitations slantboard      Knee/Hip Exercises: Aerobic   Recumbent Bike 5 min dynamic warmup, seat 5, full revolutions      Knee/Hip Exercises: Standing   Forward Lunges Left;10 reps;Right;2 sets    Forward Lunges Limitations lunge transitions to/from 2inch step and blue foam on step    Lateral Step Up Left;2 sets;10 reps;Step Height: 6";Hand Hold: 2    Lateral Step Up Limitations eccentric control    Step Down Left;10 reps;Hand Hold: 2;Step Height: 6";3 sets    Step Down Limitations eccentric control    Stairs 10RT 7 inch step  Knee/Hip Exercises: Supine   Knee Extension AROM    Knee Extension Limitations lacking 3    Knee Flexion AROM    Knee Flexion Limitations 110                    PT Education - 07/11/20 1000     Education Details HEP    Person(s) Educated Patient    Methods Explanation;Demonstration    Comprehension Verbalized understanding;Returned demonstration              PT Short Term Goals - 06/08/20 1431       PT SHORT TERM GOAL #1   Title Patient will be independent with HEP in order to improve functional outcomes.    Time 3    Period Weeks    Status Achieved    Target Date 06/02/20      PT SHORT TERM GOAL #2   Title Patient will report at least 25% improvement in symptoms for improved quality of life.    Time 3    Period Weeks    Status Achieved    Target Date 06/02/20                PT Long Term Goals - 05/30/20 1100       PT LONG TERM GOAL #1   Title Patient will report at least 75% improvement in symptoms for improved quality of life.    Time 6    Period Weeks    Status On-going      PT LONG TERM GOAL #2   Title Patient will improve FOTO score by at least 25 points in order to indicate improved tolerance to activity.    Time 6    Period Weeks    Status On-going      PT LONG TERM GOAL #3   Title Patient will be able to complete 5x STS in under 11.4 seconds in order to reduce the risk of falls.    Time 6    Period Weeks    Status On-going      PT LONG TERM GOAL #4   Title Patient will improve ROM for left knee extension/flexion to 0-115 degrees to improve squatting, and other functional mobility.    Time 6    Period Weeks    Status On-going                   Plan - 07/11/20 0959     Clinical Impression Statement Patient continues to show ROM from lacking 3 to 110 following bike. Patient educated on typical TKA rehab progression. Patient given cueing for eccentric control with stair exercises. Patient showing improving eccentric control but continues to lack eccentric strength. Patient requires intermittent cueing for alternating pattern with stairs with improved carry over with repetition. Patient will continue to benefit from skilled physical therapy in order to reduce impairment and improve function.    Personal Factors and Comorbidities Age;Fitness;Behavior Pattern;Past/Current Experience;Comorbidity 3+;Time since onset of injury/illness/exacerbation    Comorbidities anxiety, DM, HTN    Examination-Activity Limitations Locomotion Level;Transfers;Bathing;Bend;Squat;Stairs;Stand;Lift;Hygiene/Grooming;Dressing;Carry    Examination-Participation Restrictions Lawyer;Shop;Cleaning;Meal Prep;Community Activity;Laundry    Stability/Clinical Decision Making Stable/Uncomplicated    Rehab Potential Good    PT Frequency 2x /  week    PT Duration 6 weeks    PT Treatment/Interventions ADLs/Self Care Home Management;Aquatic Therapy;Cryotherapy;Electrical Stimulation;Moist Heat;Traction;DME Instruction;Ultrasound;Gait training;Stair training;Functional mobility training;Therapeutic activities;Therapeutic exercise;Balance training;Patient/family education;Manual techniques;Manual lymph drainage;Compression bandaging;Scar mobilization;Passive range of motion;Dry needling;Energy conservation;Splinting;Taping;Vasopneumatic Device;Spinal Manipulations;Joint Manipulations  PT Next Visit Plan Continue with L knee mobility and strengthening. Hip abduciton strengthening    PT Home Exercise Plan 4/28 heel prop, quad set, heel slides, ankle pumps; 5/6:  heel raise, mini squat SLR; 05/30/20 - supine bridge 5/23 seated hamstring stretch 6/6 heel prop    Consulted and Agree with Plan of Care Patient             Patient will benefit from skilled therapeutic intervention in order to improve the following deficits and impairments:  Abnormal gait, Decreased range of motion, Difficulty walking, Decreased endurance, Decreased activity tolerance, Impaired perceived functional ability, Pain, Decreased balance, Impaired flexibility, Improper body mechanics, Increased edema, Decreased strength, Decreased mobility  Visit Diagnosis: Left knee pain, unspecified chronicity  Other abnormalities of gait and mobility  Muscle weakness (generalized)  Stiffness of left knee, not elsewhere classified  Other symptoms and signs involving the musculoskeletal system     Problem List Patient Active Problem List   Diagnosis Date Noted   S/P total knee arthroplasty, left 05/10/2020   Essential hypertension, benign 10/29/2017   Abdominal pain, epigastric 10/29/2017   Postcoital bleeding 04/02/2014   Dyspareunia in female 04/02/2014    10:41 AM, 07/11/20 Mearl Latin PT, DPT Physical Therapist at [place], [place], [address] Phone: [phone]   Fax:  [phone]  Name: Patricia Sharp MRN: [id number] Date of Birth: August 05, 1950

## 2020-07-13 ENCOUNTER — Encounter (HOSPITAL_COMMUNITY): Payer: Self-pay | Admitting: Physical Therapy

## 2020-07-13 ENCOUNTER — Other Ambulatory Visit: Payer: Self-pay

## 2020-07-13 ENCOUNTER — Ambulatory Visit (HOSPITAL_COMMUNITY): Payer: Medicare PPO | Admitting: Physical Therapy

## 2020-07-13 DIAGNOSIS — M25562 Pain in left knee: Secondary | ICD-10-CM | POA: Diagnosis not present

## 2020-07-13 DIAGNOSIS — M6281 Muscle weakness (generalized): Secondary | ICD-10-CM

## 2020-07-13 DIAGNOSIS — R2689 Other abnormalities of gait and mobility: Secondary | ICD-10-CM | POA: Diagnosis not present

## 2020-07-13 DIAGNOSIS — M25662 Stiffness of left knee, not elsewhere classified: Secondary | ICD-10-CM

## 2020-07-13 DIAGNOSIS — R29898 Other symptoms and signs involving the musculoskeletal system: Secondary | ICD-10-CM

## 2020-07-13 NOTE — Therapy (Signed)
[place], [place], [address] Phone: [phone]   Fax:  [phone]  Physical Therapy Treatment  Patient Details  Name: Patricia Sharp MRN: [id number] Date of Birth: [birth year] Referring Provider (PT): Arty Baumgartner MD   Encounter Date: 07/13/2020   PT End of Session - 07/13/20 1132     Visit Number 19    Number of Visits 24    Date for PT Re-Evaluation 07/20/20    Authorization Type [employer] (no visit limit, auth required)    Authorization Time Period 12 visits requested 4/28-6/9 approved; 12 visits approved 5/25-7/6    Authorization - Visit Number 9    Authorization - Number of Visits 12    Progress Note Due on Visit 19    PT Start Time 1133    PT Stop Time 1211    PT Time Calculation (min) 38 min    Activity Tolerance Patient tolerated treatment well    Behavior During Therapy [facility] for tasks assessed/performed             Past Medical History:  Diagnosis Date   Anxiety    Arthritis    Depression    Diabetes mellitus without complication ([place])    Essential hypertension, benign 10/29/2017   Hypertension     Past Surgical History:  Procedure Laterality Date   ABDOMINAL HYSTERECTOMY     ABDOMINAL SURGERY     to remove fibroid tumors   BIOPSY  11/25/2017   Procedure: BIOPSY;  Surgeon: Rogene Houston, MD;  [facility];  Service: Endoscopy;;  gastric   CHOLECYSTECTOMY     COLONOSCOPY N/A 01/02/2013   Procedure: COLONOSCOPY;  Surgeon: Rogene Houston, MD;  [facility];  Service: Endoscopy;  Laterality: N/A;  825   ESOPHAGOGASTRODUODENOSCOPY N/A 11/25/2017   Procedure: ESOPHAGOGASTRODUODENOSCOPY (EGD);  Surgeon: Rogene Houston, MD;  [facility];  Service: Endoscopy;  Laterality: N/A;  12:00-moved to 11/11 @ 9:55am per Lelon Frohlich   REFRACTIVE SURGERY Bilateral    TOTAL KNEE ARTHROPLASTY Left 05/10/2020   Procedure: TOTAL KNEE ARTHROPLASTY;  Surgeon: Renette Butters, MD;  [facility]: [facility];  Service: Orthopedics;  Laterality: Left;    There were no vitals filed for this visit.   Subjective Assessment - 07/13/20 1133     Subjective Patient stating continued stiffness. some pain in the back of the day. Steps are still hard on the left leg going down.    Patient Stated Goals improve mobility and walking    Currently in Pain? No/denies                               [facility] Adult PT Treatment/Exercise - 07/13/20 0001       Knee/Hip Exercises: Stretches   Sports administrator 5 reps;20 seconds    Quad Stretch Limitations prone with rope    Press photographer Both;3 reps;30 seconds    Gastroc Stretch Limitations slantboard    Soleus Stretch 2 reps;20 seconds    Soleus Stretch Limitations knee bent at slant board, and runner stretch      Knee/Hip Exercises: Aerobic   Recumbent Bike 5 min dynamic warmup, seat 5, full revolutions      Knee/Hip Exercises: Standing   Step Down Left;10 reps;Hand Hold: 2;Step Height: 6";3 sets    Step Down Limitations eccentric control    Stairs 10RT 7 inch step  Knee/Hip Exercises: Seated   Other Seated Knee/Hip Exercises hamstring iso 10 second holds x 10      Knee/Hip Exercises: Supine   Knee Extension AROM    Knee Extension Limitations lacking 3    Knee Flexion AROM    Knee Flexion Limitations 110                    PT Education - 07/13/20 1133     Education Details HEP    Person(s) Educated Patient    Methods Explanation;Demonstration    Comprehension Verbalized understanding;Returned demonstration              PT Short Term Goals - 06/08/20 1431       PT SHORT TERM GOAL #1   Title Patient will be independent with HEP in order to improve functional outcomes.    Time 3    Period Weeks    Status Achieved    Target Date 06/02/20      PT SHORT TERM GOAL #2   Title Patient will report at least 25% improvement in symptoms for improved quality of life.    Time 3     Period Weeks    Status Achieved    Target Date 06/02/20               PT Long Term Goals - 05/30/20 1100       PT LONG TERM GOAL #1   Title Patient will report at least 75% improvement in symptoms for improved quality of life.    Time 6    Period Weeks    Status On-going      PT LONG TERM GOAL #2   Title Patient will improve FOTO score by at least 25 points in order to indicate improved tolerance to activity.    Time 6    Period Weeks    Status On-going      PT LONG TERM GOAL #3   Title Patient will be able to complete 5x STS in under 11.4 seconds in order to reduce the risk of falls.    Time 6    Period Weeks    Status On-going      PT LONG TERM GOAL #4   Title Patient will improve ROM for left knee extension/flexion to 0-115 degrees to improve squatting, and other functional mobility.    Time 6    Period Weeks    Status On-going                   Plan - 07/13/20 1132     Clinical Impression Statement Patient given reassurance that she is progressing well with PT and current symptoms are normal post TKA. Patient demonstrating ROM from lacking 3 to 110 following bike. Improves to 112 following prone quad stretch. Patient with continued UE support with forward step downs likely due to impaired strength and closed chain DF ROM. Patient performs stretches in standing without change in symptoms. Patient overall progressing well functionally, anticipate d/c next session. Patient will continue to benefit from skilled physical therapy in order to improve function and reduce impairment.    Personal Factors and Comorbidities Age;Fitness;Behavior Pattern;Past/Current Experience;Comorbidity 3+;Time since onset of injury/illness/exacerbation    Comorbidities anxiety, DM, HTN    Examination-Activity Limitations Locomotion Level;Transfers;Bathing;Bend;Squat;Stairs;Stand;Lift;Hygiene/Grooming;Dressing;Carry    Examination-Participation Restrictions Nature conservation officer;Shop;Cleaning;Meal Prep;Community Activity;Laundry    Stability/Clinical Decision Making Stable/Uncomplicated    Rehab Potential Good    PT Frequency 2x / week    PT  Duration 6 weeks    PT Treatment/Interventions ADLs/Self Care Home Management;Aquatic Therapy;Cryotherapy;Electrical Stimulation;Moist Heat;Traction;DME Instruction;Ultrasound;Gait training;Stair training;Functional mobility training;Therapeutic activities;Therapeutic exercise;Balance training;Patient/family education;Manual techniques;Manual lymph drainage;Compression bandaging;Scar mobilization;Passive range of motion;Dry needling;Energy conservation;Splinting;Taping;Vasopneumatic Device;Spinal Manipulations;Joint Manipulations    PT Next Visit Plan Continue with L knee mobility and strengthening. Hip abduciton strengthening    PT Home Exercise Plan 4/28 heel prop, quad set, heel slides, ankle pumps; 5/6:  heel raise, mini squat SLR; 05/30/20 - supine bridge 5/23 seated hamstring stretch 6/6 heel prop    Consulted and Agree with Plan of Care Patient             Patient will benefit from skilled therapeutic intervention in order to improve the following deficits and impairments:  Abnormal gait, Decreased range of motion, Difficulty walking, Decreased endurance, Decreased activity tolerance, Impaired perceived functional ability, Pain, Decreased balance, Impaired flexibility, Improper body mechanics, Increased edema, Decreased strength, Decreased mobility  Visit Diagnosis: Left knee pain, unspecified chronicity  Other abnormalities of gait and mobility  Muscle weakness (generalized)  Other symptoms and signs involving the musculoskeletal system  Stiffness of left knee, not elsewhere classified     Problem List Patient Active Problem List   Diagnosis Date Noted   S/P total knee arthroplasty, left 05/10/2020   Essential hypertension, benign 10/29/2017   Abdominal pain, epigastric 10/29/2017   Postcoital  bleeding 04/02/2014   Dyspareunia in female 04/02/2014     12:13 PM, 07/13/20 Mearl Latin PT, DPT Physical Therapist at [place], [place], [address] Phone: [phone]   Fax:  [phone]  Name: Patricia Sharp MRN: [id number] Date of Birth: Aug 21, 1950

## 2020-07-19 ENCOUNTER — Other Ambulatory Visit: Payer: Self-pay

## 2020-07-19 ENCOUNTER — Encounter (HOSPITAL_COMMUNITY): Payer: Self-pay | Admitting: Physical Therapy

## 2020-07-19 ENCOUNTER — Ambulatory Visit (HOSPITAL_COMMUNITY): Payer: Medicare PPO | Attending: Orthopedic Surgery | Admitting: Physical Therapy

## 2020-07-19 DIAGNOSIS — M6281 Muscle weakness (generalized): Secondary | ICD-10-CM | POA: Diagnosis not present

## 2020-07-19 DIAGNOSIS — R2689 Other abnormalities of gait and mobility: Secondary | ICD-10-CM | POA: Insufficient documentation

## 2020-07-19 DIAGNOSIS — R29898 Other symptoms and signs involving the musculoskeletal system: Secondary | ICD-10-CM | POA: Insufficient documentation

## 2020-07-19 DIAGNOSIS — M25662 Stiffness of left knee, not elsewhere classified: Secondary | ICD-10-CM | POA: Insufficient documentation

## 2020-07-19 DIAGNOSIS — M25562 Pain in left knee: Secondary | ICD-10-CM | POA: Diagnosis not present

## 2020-07-19 NOTE — Therapy (Signed)
[place], [place], [address] Phone: [phone]   Fax:  [phone]  Physical Therapy Treatment/Discharge Summary  Patient Details  Name: Patricia Sharp MRN: [id number] Date of Birth: 03-Mar-1950 Referring Provider (PT): Arty Baumgartner MD   Encounter Date: 07/19/2020 PHYSICAL THERAPY DISCHARGE SUMMARY  Visits from Start of Care: 20  Current functional level related to goals / functional outcomes: See below   Remaining deficits: See below   Education / Equipment: See below   Patient agrees to discharge. Patient goals were met. Patient is being discharged due to meeting the stated rehab goals.    PT End of Session - 07/19/20 1047     Visit Number 20    Number of Visits 24    Date for PT Re-Evaluation 07/20/20    Authorization Type [employer] (no visit limit, auth required)    Authorization Time Period 12 visits requested 4/28-6/9 approved; 12 visits approved 5/25-7/6    Authorization - Visit Number 9    Authorization - Number of Visits 12    Progress Note Due on Visit 54    PT Start Time 1048    PT Stop Time 7517    PT Time Calculation (min) 40 min    Activity Tolerance Patient tolerated treatment well    Behavior During Therapy WFL for tasks assessed/performed             Past Medical History:  Diagnosis Date   Anxiety    Arthritis    Depression    Diabetes mellitus without complication ([place])    Essential hypertension, benign 10/29/2017   Hypertension     Past Surgical History:  Procedure Laterality Date   ABDOMINAL HYSTERECTOMY     ABDOMINAL SURGERY     to remove fibroid tumors   BIOPSY  11/25/2017   Procedure: BIOPSY;  Surgeon: Rogene Houston, MD;  [facility];  Service: Endoscopy;;  gastric   CHOLECYSTECTOMY     COLONOSCOPY N/A 01/02/2013   Procedure: COLONOSCOPY;  Surgeon: Rogene Houston, MD;  [facility];  Service: Endoscopy;  Laterality: N/A;  825    ESOPHAGOGASTRODUODENOSCOPY N/A 11/25/2017   Procedure: ESOPHAGOGASTRODUODENOSCOPY (EGD);  Surgeon: Rogene Houston, MD;  [facility];  Service: Endoscopy;  Laterality: N/A;  12:00-moved to 11/11 @ 9:55am per Lelon Frohlich   REFRACTIVE SURGERY Bilateral    TOTAL KNEE ARTHROPLASTY Left 05/10/2020   Procedure: TOTAL KNEE ARTHROPLASTY;  Surgeon: Renette Butters, MD;  [facility];  Service: Orthopedics;  Laterality: Left;    There were no vitals filed for this visit.   Subjective Assessment - 07/19/20 1048     Subjective Patient states her knee doesn't want to bend like it should and she has trouble with stairs because her steps are more narrow. Her home exercises are going well. She continues to ice her knee. Patient states 80% improvement since beginning physical therapy.    Patient Stated Goals improve mobility and walking    Currently in Pain? No/denies                [facility] PT Assessment - 07/19/20 0001       Assessment   Medical Diagnosis L TKA    Referring Provider (PT) Arty Baumgartner MD    Next MD Visit 06/15/20    Prior Therapy in hospital      Precautions   Precautions None      Restrictions   Weight Bearing  Restrictions No      Balance Screen   Has the patient fallen in the past 6 months No    Has the patient had a decrease in activity level because of a fear of falling?  No    Is the patient reluctant to leave their home because of a fear of falling?  No      Prior Function   Level of Independence Independent    Vocation Retired      Charity fundraiser Status Within Functional Limits for tasks assessed      Observation/Other Assessments   Observations ambulates with carrying SPC    Focus on Therapeutic Outcomes (FOTO)  67% function      AROM   Left Knee Extension 3   lacking   Left Knee Flexion 111      Strength   Left Knee Flexion 5/5    Left Knee Extension 5/5      Transfers   Five time sit to stand comments  9.76 seconds  without UE support                           [facility] Adult PT Treatment/Exercise - 07/19/20 0001       Knee/Hip Exercises: Stretches   Sports administrator 5 reps;20 seconds    Quad Stretch Limitations prone with rope    Other Knee/Hip Stretches prone quad stretch with overpressure 3x 10 seconds; supine heel slides with overpressure 5 x 10 second holds      Knee/Hip Exercises: Aerobic   Recumbent Bike 5 min dynamic warmup, seat 5, full revolutions      Knee/Hip Exercises: Standing   Stairs 10RT 7 inch step                    PT Education - 07/19/20 1048     Education Details HEP, reassessment findings, returning to PT if needed    Person(s) Educated Patient    Methods Explanation;Demonstration    Comprehension Verbalized understanding;Returned demonstration              PT Short Term Goals - 06/08/20 1431       PT SHORT TERM GOAL #1   Title Patient will be independent with HEP in order to improve functional outcomes.    Time 3    Period Weeks    Status Achieved    Target Date 06/02/20      PT SHORT TERM GOAL #2   Title Patient will report at least 25% improvement in symptoms for improved quality of life.    Time 3    Period Weeks    Status Achieved    Target Date 06/02/20               PT Long Term Goals - 07/19/20 1102       PT LONG TERM GOAL #1   Title Patient will report at least 75% improvement in symptoms for improved quality of life.    Time 6    Period Weeks    Status Achieved      PT LONG TERM GOAL #2   Title Patient will improve FOTO score by at least 25 points in order to indicate improved tolerance to activity.    Time 6    Period Weeks    Status Achieved      PT LONG TERM GOAL #3   Title Patient will be able to complete 5x STS  in under 11.4 seconds in order to reduce the risk of falls.    Time 6    Period Weeks    Status Achieved      PT LONG TERM GOAL #4   Title Patient will improve ROM for left knee  extension/flexion to 0-115 degrees to improve squatting, and other functional mobility.    Time 6    Period Weeks    Status Not Met                   Plan - 07/19/20 1048     Clinical Impression Statement Patient has met 2/2 short term goals and  long term goals with ability to complete HEP and improvement in symptoms, activity tolerance, strength, gait, and functional mobility. Patient remains limited by end range motion. Patient showing good ROM from lacking 3 to 111 following bike for dynamic warm up. Patient continues to be concerned with stiffness despite showing fair ROM following TKA and is likely limited by continued edema. Patient ROM improves to 115 AAROM with PT overpressure following stretches. Patient completes stairs with reciprocal pattern with UE support. Patient with c/o posterior knee pain/ stiffness with eccentric phase of stairs likely due to continued swelling. Patient discharged from physical therapy at this time.    Personal Factors and Comorbidities Age;Fitness;Behavior Pattern;Past/Current Experience;Comorbidity 3+;Time since onset of injury/illness/exacerbation    Comorbidities anxiety, DM, HTN    Examination-Activity Limitations Locomotion Level;Transfers;Bathing;Bend;Squat;Stairs;Stand;Lift;Hygiene/Grooming;Dressing;Carry    Examination-Participation Restrictions Lawyer;Shop;Cleaning;Meal Prep;Community Activity;Laundry    Stability/Clinical Decision Making Stable/Uncomplicated    Rehab Potential Good    PT Frequency --    PT Duration --    PT Treatment/Interventions ADLs/Self Care Home Management;Aquatic Therapy;Cryotherapy;Electrical Stimulation;Moist Heat;Traction;DME Instruction;Ultrasound;Gait training;Stair training;Functional mobility training;Therapeutic activities;Therapeutic exercise;Balance training;Patient/family education;Manual techniques;Manual lymph drainage;Compression bandaging;Scar mobilization;Passive range of motion;Dry  needling;Energy conservation;Splinting;Taping;Vasopneumatic Device;Spinal Manipulations;Joint Manipulations    PT Next Visit Plan n/a    PT Home Exercise Plan 4/28 heel prop, quad set, heel slides, ankle pumps; 5/6:  heel raise, mini squat SLR; 05/30/20 - supine bridge 5/23 seated hamstring stretch 6/6 heel prop    Consulted and Agree with Plan of Care Patient             Patient will benefit from skilled therapeutic intervention in order to improve the following deficits and impairments:  Abnormal gait, Decreased range of motion, Difficulty walking, Decreased endurance, Decreased activity tolerance, Impaired perceived functional ability, Pain, Decreased balance, Impaired flexibility, Improper body mechanics, Increased edema, Decreased strength, Decreased mobility  Visit Diagnosis: Left knee pain, unspecified chronicity  Other abnormalities of gait and mobility  Muscle weakness (generalized)  Other symptoms and signs involving the musculoskeletal system  Stiffness of left knee, not elsewhere classified     Problem List Patient Active Problem List   Diagnosis Date Noted   S/P total knee arthroplasty, left 05/10/2020   Essential hypertension, benign 10/29/2017   Abdominal pain, epigastric 10/29/2017   Postcoital bleeding 04/02/2014   Dyspareunia in female 04/02/2014   11:30 AM, 07/19/20 Mearl Latin PT, DPT Physical Therapist at [place], [place], [address] Phone: [phone]   Fax:  [phone]  Name: Patricia Sharp MRN: [id number] Date of Birth: 02/17/1950

## 2020-07-21 ENCOUNTER — Ambulatory Visit (HOSPITAL_COMMUNITY): Payer: Medicare PPO | Admitting: Physical Therapy

## 2020-07-25 ENCOUNTER — Ambulatory Visit (HOSPITAL_COMMUNITY): Payer: Medicare PPO | Admitting: Physical Therapy

## 2020-07-27 ENCOUNTER — Encounter (HOSPITAL_COMMUNITY): Payer: Medicare PPO | Admitting: Physical Therapy

## 2020-08-24 DIAGNOSIS — H401131 Primary open-angle glaucoma, bilateral, mild stage: Secondary | ICD-10-CM | POA: Diagnosis not present

## 2020-08-24 DIAGNOSIS — H2513 Age-related nuclear cataract, bilateral: Secondary | ICD-10-CM | POA: Diagnosis not present

## 2020-10-03 DIAGNOSIS — U099 Post covid-19 condition, unspecified: Secondary | ICD-10-CM | POA: Diagnosis not present

## 2020-10-19 DIAGNOSIS — H401131 Primary open-angle glaucoma, bilateral, mild stage: Secondary | ICD-10-CM | POA: Diagnosis not present

## 2020-10-19 DIAGNOSIS — H2513 Age-related nuclear cataract, bilateral: Secondary | ICD-10-CM | POA: Diagnosis not present

## 2020-10-19 DIAGNOSIS — L820 Inflamed seborrheic keratosis: Secondary | ICD-10-CM | POA: Diagnosis not present

## 2020-10-26 ENCOUNTER — Other Ambulatory Visit: Payer: Self-pay | Admitting: Ophthalmology

## 2020-10-26 DIAGNOSIS — L820 Inflamed seborrheic keratosis: Secondary | ICD-10-CM | POA: Diagnosis not present

## 2020-11-22 ENCOUNTER — Other Ambulatory Visit (HOSPITAL_COMMUNITY): Payer: Self-pay | Admitting: Family Medicine

## 2020-11-22 DIAGNOSIS — Z1231 Encounter for screening mammogram for malignant neoplasm of breast: Secondary | ICD-10-CM

## 2020-11-30 ENCOUNTER — Other Ambulatory Visit: Payer: Self-pay

## 2020-11-30 ENCOUNTER — Ambulatory Visit (HOSPITAL_COMMUNITY)
Admission: RE | Admit: 2020-11-30 | Discharge: 2020-11-30 | Disposition: A | Discharge status: Home / Self Care | Payer: Medicare PPO | Source: Ambulatory Visit | Attending: Family Medicine | Admitting: Family Medicine

## 2020-11-30 DIAGNOSIS — Z1231 Encounter for screening mammogram for malignant neoplasm of breast: Secondary | ICD-10-CM | POA: Insufficient documentation

## 2020-12-06 DIAGNOSIS — M1712 Unilateral primary osteoarthritis, left knee: Secondary | ICD-10-CM | POA: Diagnosis not present

## 2020-12-06 DIAGNOSIS — I10 Essential (primary) hypertension: Secondary | ICD-10-CM | POA: Diagnosis not present

## 2020-12-06 DIAGNOSIS — Z1331 Encounter for screening for depression: Secondary | ICD-10-CM | POA: Diagnosis not present

## 2020-12-06 DIAGNOSIS — E119 Type 2 diabetes mellitus without complications: Secondary | ICD-10-CM | POA: Diagnosis not present

## 2020-12-06 DIAGNOSIS — Z6831 Body mass index (BMI) 31.0-31.9, adult: Secondary | ICD-10-CM | POA: Diagnosis not present

## 2020-12-06 DIAGNOSIS — Z0000 Encounter for general adult medical examination without abnormal findings: Secondary | ICD-10-CM | POA: Diagnosis not present

## 2020-12-06 DIAGNOSIS — F419 Anxiety disorder, unspecified: Secondary | ICD-10-CM | POA: Diagnosis not present

## 2020-12-06 DIAGNOSIS — E6609 Other obesity due to excess calories: Secondary | ICD-10-CM | POA: Diagnosis not present

## 2020-12-06 DIAGNOSIS — E782 Mixed hyperlipidemia: Secondary | ICD-10-CM | POA: Diagnosis not present

## 2021-03-01 DIAGNOSIS — H401131 Primary open-angle glaucoma, bilateral, mild stage: Secondary | ICD-10-CM | POA: Diagnosis not present

## 2021-03-01 DIAGNOSIS — H2513 Age-related nuclear cataract, bilateral: Secondary | ICD-10-CM | POA: Diagnosis not present

## 2021-03-06 DIAGNOSIS — H2511 Age-related nuclear cataract, right eye: Secondary | ICD-10-CM | POA: Diagnosis not present

## 2021-03-06 DIAGNOSIS — H2513 Age-related nuclear cataract, bilateral: Secondary | ICD-10-CM | POA: Diagnosis not present

## 2021-03-06 DIAGNOSIS — H401111 Primary open-angle glaucoma, right eye, mild stage: Secondary | ICD-10-CM | POA: Diagnosis not present

## 2021-03-06 DIAGNOSIS — H401131 Primary open-angle glaucoma, bilateral, mild stage: Secondary | ICD-10-CM | POA: Diagnosis not present

## 2021-03-13 DIAGNOSIS — I4891 Unspecified atrial fibrillation: Secondary | ICD-10-CM | POA: Diagnosis not present

## 2021-03-13 DIAGNOSIS — E6609 Other obesity due to excess calories: Secondary | ICD-10-CM | POA: Diagnosis not present

## 2021-03-13 DIAGNOSIS — Z6831 Body mass index (BMI) 31.0-31.9, adult: Secondary | ICD-10-CM | POA: Diagnosis not present

## 2021-03-15 DIAGNOSIS — I4891 Unspecified atrial fibrillation: Secondary | ICD-10-CM

## 2021-03-15 HISTORY — DX: Unspecified atrial fibrillation: I48.91

## 2021-03-21 NOTE — Progress Notes (Signed)
CARDIOLOGY CONSULT NOTE  ? ? ? ? ? ?Patient ID: ?Patricia Sharp ?MRN: [id number] ?DOB/AGE: 02-07-1950 71 y.o. ? ?Admit date: (Not on file) ?Referring Physician: Hilma Favors ?Primary Physician: Sharilyn Sites, MD ?Primary Cardiologist: New ?Reason for Consultation: New onset afib ? ?Active Problems: ?  * No active hospital problems. * ? ? ?HPI:  71 y.o. referred by Dr Hilma Favors for new onset afib.  History of anxiety, Arthritis, DM and HTN CHADVASC 2 for HTN and DM.  Seen in 12/06/17 for atypical chest pain TTE normal EF mild AR 12/16/17 and Normal lexiscan myovue same day breast attenuation EF 69%  ? ?She went to have right cataract done by Dr Venetia Maxon 2 weeks ago and was found to be in afib. Rate control is fine on cardizem Started on Xarelto but looks like PE dosing as she has been taking 15 mg bid. She is unaware of rhythm No chest pain, dyspnea palpitations or syncope No bleeding issues Wants left cataract done end of March and Dr Venetia Maxon indicates she does not need to hold Xarelto for it ? ?Discussed strategy of rate control vs conversion Favor latter at least one attempt  ? ?ROS ?All other systems reviewed and negative except as noted above ? ?Past Medical History:  ?Diagnosis Date  ? Anxiety   ? Arthritis   ? Depression   ? Diabetes mellitus without complication ([place])   ? Essential hypertension, benign 10/29/2017  ? Hypertension   ?  ?Family History  ?Problem Relation Age of Onset  ? Colon cancer Neg Hx   ?  ?Social History  ? ?Socioeconomic History  ? Marital status: Widowed  ?  Spouse name: Not on file  ? Number of children: Not on file  ? Years of education: Not on file  ? Highest education level: Not on file  ?Occupational History  ? Not on file  ?Tobacco Use  ? Smoking status: Never  ? Smokeless tobacco: Never  ?Vaping Use  ? Vaping Use: Never used  ?Substance and Sexual Activity  ? Alcohol use: No  ? Drug use: No  ? Sexual activity: Not on file  ?Other Topics Concern  ? Not on file  ?Social History Narrative  ?  Not on file  ? ?Social Determinants of Health  ? ?Financial Resource Strain: Not on file  ?Food Insecurity: Not on file  ?Transportation Needs: Not on file  ?Physical Activity: Not on file  ?Stress: Not on file  ?Social Connections: Not on file  ?Intimate Partner Violence: Not on file  ?  ?Past Surgical History:  ?Procedure Laterality Date  ? ABDOMINAL HYSTERECTOMY    ? ABDOMINAL SURGERY    ? to remove fibroid tumors  ? BIOPSY  11/25/2017  ? Procedure: BIOPSY;  Surgeon: Rogene Houston, MD;  [facility];  Service: Endoscopy;;  gastric  ? CHOLECYSTECTOMY    ? COLONOSCOPY N/A 01/02/2013  ? Procedure: COLONOSCOPY;  Surgeon: Rogene Houston, MD;  [facility]: [facility];  Service: Endoscopy;  Laterality: N/A;  825  ? ESOPHAGOGASTRODUODENOSCOPY N/A 11/25/2017  ? Procedure: ESOPHAGOGASTRODUODENOSCOPY (EGD);  Surgeon: Rogene Houston, MD;  [facility];  Service: Endoscopy;  Laterality: N/A;  12:00-moved to 11/11 @ 9:55am per Lelon Frohlich  ? REFRACTIVE SURGERY Bilateral   ? TOTAL KNEE ARTHROPLASTY Left 05/10/2020  ? Procedure: TOTAL KNEE ARTHROPLASTY;  Surgeon: Renette Butters, MD;  Location: [facility];  Service: Orthopedics;  Laterality: Left;  ?  ? ? ?Current Outpatient Medications:  ?  acetaminophen (TYLENOL) 500 MG tablet, Take 2 tablets (1,000 mg total) by mouth every 6 (six) hours as needed for mild pain or moderate pain., Disp: 60 tablet, Rfl: 0 ?  ALPRAZolam (XANAX) 0.5 MG tablet, Take 0.25-0.5 mg by mouth at bedtime as needed for anxiety or sleep. , Disp: , Rfl:  ?  aspirin EC 81 MG tablet, Take 1 tablet (81 mg total) by mouth 2 (two) times daily. For DVT prophylaxis for 30 days after surgery., Disp: 60 tablet, Rfl: 0 ?  Cholecalciferol (VITAMIN D3) 1000 units CAPS, Take 1,000 Units by mouth daily. , Disp: , Rfl:  ?  diltiazem (TIAZAC) 360 MG 24 hr capsule, Take 360 mg by mouth daily., Disp: , Rfl:  ?  hydrochlorothiazide (HYDRODIURIL) 25 MG tablet, Take 25 mg by mouth daily., Disp: , Rfl:  ?  ILEVRO  0.3 % ophthalmic suspension, Place 1 drop into the right eye 2 (two) times daily., Disp: , Rfl:  ?  LOTEMAX SM 0.38 % GEL, Place 1 drop into the right eye 3 (three) times daily., Disp: , Rfl:  ?  metFORMIN (GLUCOPHAGE) 500 MG tablet, Take 250 mg by mouth 2 (two) times daily with a meal., Disp: , Rfl:  ?  methocarbamol (ROBAXIN) 500 MG tablet, Take 1 tablet (500 mg total) by mouth every 8 (eight) hours as needed for muscle spasms., Disp: 20 tablet, Rfl: 0 ?  ofloxacin (OCUFLOX) 0.3 % ophthalmic solution, Place 1 drop into the right eye 3 (three) times daily., Disp: , Rfl:  ?  olmesartan (BENICAR) 20 MG tablet, , Disp: , Rfl:  ?  Omega-3 Fatty Acids (FISH OIL) 1000 MG CAPS, Take 1,000 mg by mouth daily., Disp: , Rfl:  ?  rivaroxaban (XARELTO) 20 MG TABS tablet, Take 20 mg by mouth daily with supper., Disp: , Rfl:  ?  Carboxymethylcellulose Sodium (THERATEARS OP), Place 1 drop into both eyes daily. (Patient not taking: Reported on 03/23/2021), Disp: , Rfl:  ?  furosemide (LASIX) 40 MG tablet, Take 40 mg by mouth daily as needed for fluid.  (Patient not taking: Reported on 03/23/2021), Disp: , Rfl:  ?  meloxicam (MOBIC) 15 MG tablet, Take 1 tablet (15 mg total) by mouth daily. (Patient not taking: Reported on 03/23/2021), Disp: 30 tablet, Rfl: 0 ?  omeprazole (PRILOSEC OTC) 20 MG tablet, Take 1 tablet (20 mg total) by mouth daily. For gastric protection (Patient not taking: Reported on 03/23/2021), Disp: 30 tablet, Rfl: 0 ?  ondansetron (ZOFRAN) 4 MG tablet, Take 1 tablet (4 mg total) by mouth daily as needed for nausea or vomiting. (Patient not taking: Reported on 03/23/2021), Disp: 10 tablet, Rfl: 0 ?  oxyCODONE (ROXICODONE) 5 MG immediate release tablet, Take 1 tablet (5 mg total) by mouth every 6 (six) hours as needed for severe pain. Do not take more than 6 tablets in a 24 hour period. (Patient not taking: Reported on 03/23/2021), Disp: 28 tablet, Rfl: 0 ? ? ? ?Physical Exam: ?Blood pressure (!) 172/80, pulse 83, height '5\' 1"'$   (1.549 m), weight 163 lb 6.4 oz (74.1 kg), SpO2 98 %.   ? ?Affect appropriate ?Healthy:  appears stated age ?HEENT: normal ?Neck supple with no adenopathy ?JVP normal no bruits no thyromegaly ?Lungs clear with no wheezing and good diaphragmatic motion ?Heart:  S1/S2 no murmur, no rub, gallop or click ?PMI normal ?Abdomen: benighn, BS positve, no tenderness, no AAA ?no bruit.  No HSM or HJR ?Distal pulses intact with no bruits ?No edema ?Neuro  non-focal ?Skin warm and dry ?Post left TKR  ? ? ?Labs: ?  ?Lab Results  ?Component Value Date  ? WBC 10.6 (H) 05/02/2020  ? HGB 14.0 05/02/2020  ? HCT 42.7 05/02/2020  ? MCV 89.7 05/02/2020  ? PLT 340 05/02/2020  ? No results for input(s): NA, K, CL, CO2, BUN, CREATININE, CALCIUM, PROT, BILITOT, ALKPHOS, ALT, AST, GLUCOSE in the last 168 hours. ? ?Invalid input(s): LABALBU ?No results found for: CKTOTAL, CKMB, CKMBINDEX, TROPONINI No results found for: CHOL ?No results found for: HDL ?No results found for: [place] ?No results found for: TRIG ?No results found for: CHOLHDL ?No results found for: LDLDIRECT  ?  ?Radiology: ?No results found. ? ?EKG: afib rates 74 nonspecific ST changes  ? ? ?ASSESSMENT AND PLAN:  ? ? PAF:  Duration unknown Rate control fine Change xarelto to 20 mg daily dosing for afib TTE to assess EF and atrial size F/U in 3-4 weeks and if still in afib arrange [facility]  ?HTN: Well controlled.  Continue current medications and low sodium Dash type diet.   ?DM:  Discussed low carb diet.  Target hemoglobin A1c is 6.5 or less.  Continue current medications. ?Anxiety/Depression:  PRN xanax not on SSRI ?Arthritis:  using oxycodone and robaxin discussed limiting use of Mobic ? ? ?Echo ?F/U in 3-4 weeks to arrange [facility] if still in afib  ? ?Ok to have cataract surgery so long as Xarelto not stopped ? ?Signed: ?Jenkins Rouge ?03/23/2021, 9:16 AM ? ? ?

## 2021-03-23 ENCOUNTER — Encounter: Payer: Self-pay | Admitting: Cardiovascular Disease

## 2021-03-23 ENCOUNTER — Ambulatory Visit: Payer: Medicare PPO | Admitting: Cardiovascular Disease

## 2021-03-23 ENCOUNTER — Encounter: Payer: Self-pay | Admitting: *Deleted

## 2021-03-23 VITALS — BP 172/80 | HR 83 | Ht 61.0 in | Wt 163.4 lb

## 2021-03-23 DIAGNOSIS — I10 Essential (primary) hypertension: Secondary | ICD-10-CM | POA: Diagnosis not present

## 2021-03-23 DIAGNOSIS — I4891 Unspecified atrial fibrillation: Secondary | ICD-10-CM

## 2021-03-23 MED ORDER — RIVAROXABAN 20 MG PO TABS: 20.0000 mg | ORAL_TABLET | Freq: Every day | ORAL | 11 refills | Status: DC

## 2021-03-23 NOTE — Patient Instructions (Signed)
Medication Instructions:  ? ?Start Xarelto 20 mg Daily  ? ?*If you need a refill on your cardiac medications before your next appointment, please call your pharmacy* ? ? ?Lab Work: ?NONE  ? ?If you have labs (blood work) drawn today and your tests are completely normal, you will receive your results only by: ?MyChart Message (if you have MyChart) OR ?A paper copy in the mail ?If you have any lab test that is abnormal or we need to change your treatment, we will call you to review the results. ? ? ?Testing/Procedures: ?Your physician has requested that you have an echocardiogram. Echocardiography is a painless test that uses sound waves to create images of your heart. It provides your doctor with information about the size and shape of your heart and how well your heart?s chambers and valves are working. This procedure takes approximately one hour. There are no restrictions for this procedure. ? ? ? ?Follow-Up: ?At [facility], you and your health needs are our priority.  As part of our continuing mission to provide you with exceptional heart care, we have created designated Provider Care Teams.  These Care Teams include your primary Cardiologist (physician) and Advanced Practice Providers (APPs -  Physician Assistants and Nurse Practitioners) who all work together to provide you with the care you need, when you need it. ? ?We recommend signing up for the patient portal called "MyChart".  Sign up information is provided on this After Visit Summary.  MyChart is used to connect with patients for Virtual Visits (Telemedicine).  Patients are able to view lab/test results, encounter notes, upcoming appointments, etc.  Non-urgent messages can be sent to your provider as well.   ?To learn more about what you can do with MyChart, go to [email or web address].   ? ?Your next appointment:   ?3 -4 week(s) ? ?The format for your next appointment:   ?In Person ? ?Provider:   ?Jenkins Rouge, MD  ? ? ?Other Instructions ?Thank  you for choosing [facility]! ? ? ? ?

## 2021-03-23 NOTE — Addendum Note (Signed)
Addended by: Levonne Hubert on: 03/23/2021 01:11 PM ? ? Modules accepted: Orders ? ?

## 2021-04-03 DIAGNOSIS — Z6831 Body mass index (BMI) 31.0-31.9, adult: Secondary | ICD-10-CM | POA: Diagnosis not present

## 2021-04-03 DIAGNOSIS — E118 Type 2 diabetes mellitus with unspecified complications: Secondary | ICD-10-CM | POA: Diagnosis not present

## 2021-04-03 DIAGNOSIS — E559 Vitamin D deficiency, unspecified: Secondary | ICD-10-CM | POA: Diagnosis not present

## 2021-04-03 DIAGNOSIS — I10 Essential (primary) hypertension: Secondary | ICD-10-CM | POA: Diagnosis not present

## 2021-04-03 DIAGNOSIS — F419 Anxiety disorder, unspecified: Secondary | ICD-10-CM | POA: Diagnosis not present

## 2021-04-03 DIAGNOSIS — E119 Type 2 diabetes mellitus without complications: Secondary | ICD-10-CM | POA: Diagnosis not present

## 2021-04-03 DIAGNOSIS — E782 Mixed hyperlipidemia: Secondary | ICD-10-CM | POA: Diagnosis not present

## 2021-04-03 DIAGNOSIS — D509 Iron deficiency anemia, unspecified: Secondary | ICD-10-CM | POA: Diagnosis not present

## 2021-04-03 DIAGNOSIS — I4891 Unspecified atrial fibrillation: Secondary | ICD-10-CM | POA: Diagnosis not present

## 2021-04-14 ENCOUNTER — Ambulatory Visit (HOSPITAL_COMMUNITY)
Admission: RE | Admit: 2021-04-14 | Discharge: 2021-04-14 | Disposition: A | Discharge status: Home / Self Care | Payer: Medicare PPO | Source: Ambulatory Visit | Attending: Cardiovascular Disease | Admitting: Cardiovascular Disease

## 2021-04-14 DIAGNOSIS — I4891 Unspecified atrial fibrillation: Secondary | ICD-10-CM | POA: Diagnosis not present

## 2021-04-14 LAB — ECHOCARDIOGRAM COMPLETE
AR max vel: 2.51 cm2
AV Area VTI: 2.49 cm2
AV Area mean vel: 2.6 cm2
AV Mean grad: 3.7 mmHg
AV Peak grad: 8.8 mmHg
Ao pk vel: 1.48 m/s
Area-P 1/2: 3.85 cm2
P 1/2 time: 641 msec
S' Lateral: 3.2 cm

## 2021-04-14 NOTE — Progress Notes (Signed)
*  PRELIMINARY RESULTS* ?Echocardiogram ?2D Echocardiogram has been performed. ? ?Samuel Germany ?04/14/2021, 9:07 AM ?

## 2021-04-19 ENCOUNTER — Telehealth: Payer: Self-pay | Admitting: Cardiovascular Disease

## 2021-04-19 NOTE — Telephone Encounter (Signed)
Josue Hector, MD  ?04/14/2021  9:47 PM EDT   ?  ?EF normal just mild AR overall good  ? ? ?Patient notified and verbalized understanding. Pt would like to know if she still needs to keep April 14th appointment. Pt is supposed to have Cataract surgery on April 17th.   ?

## 2021-04-19 NOTE — Telephone Encounter (Signed)
Patient called to get results from echo and to see if she is still in afib or not. Patient is on edge and wants someone to call today. ?

## 2021-04-20 NOTE — Telephone Encounter (Signed)
Patient misunderstood echo results, thought she was in NSR.She will keep appointment with Dr.Nishan as planned. ?

## 2021-04-21 NOTE — Progress Notes (Signed)
?CARDIOLOGY CONSULT NOTE  ? ? ? ? ? ?Patient ID: ?Grayland Jack Kamp ?MRN: [id number] ?DOB/AGE: Feb 05, 1950 71 y.o. ? ?Admit date: (Not on file) ?Referring Physician: Hilma Favors ?Primary Physician: Sharilyn Sites, MD ?Primary Cardiologist: New ?Reason for Consultation: New onset afib ? ?Active Problems: ?  * No active hospital problems. * ? ? ?HPI:  71 y.o. referred by Dr Hilma Favors for new onset afib 03/23/21 .  History of anxiety, Arthritis, DM and HTN CHADVASC 2 for HTN and DM.  Seen by me  in 12/06/17 for atypical chest pain TTE normal EF mild AR 12/16/17 and Normal lexiscan myovue same day breast attenuation EF 69%  ? ?She went to have right cataract done by Dr Venetia Maxon 2 weeks ago and was found to be in afib. Rate control is fine on cardizem Started on Xarelto but looks like PE dosing as she has been taking 15 mg bid. She is unaware of rhythm No chest pain, dyspnea palpitations or syncope No bleeding issues Wants left cataract done end of March and Dr Venetia Maxon indicates she does not need to hold Xarelto for it ? ?Discussed strategy of rate control vs conversion Favor latter at least one attempt  ? ?TTE 04/14/21 EF normal 60-65% severe LAE mild RAE and mild AR During echo was still in ?Afib  ? ?Risks of [place] including stroke, PPM, intubation discussed willing to proceed Wants to have it done at [facility] ? ? ?ROS ?All other systems reviewed and negative except as noted above ? ?Past Medical History:  ?Diagnosis Date  ? Anxiety   ? Arthritis   ? Depression   ? Diabetes mellitus without complication ([place])   ? Essential hypertension, benign 10/29/2017  ? Hypertension   ?  ?Family History  ?Problem Relation Age of Onset  ? Colon cancer Neg Hx   ?  ?Social History  ? ?Socioeconomic History  ? Marital status: Widowed  ?  Spouse name: Not on file  ? Number of children: Not on file  ? Years of education: Not on file  ? Highest education level: Not on file  ?Occupational History  ? Not on file  ?Tobacco Use  ? Smoking status: Never  ? Smokeless  tobacco: Never  ?Vaping Use  ? Vaping Use: Never used  ?Substance and Sexual Activity  ? Alcohol use: No  ? Drug use: No  ? Sexual activity: Not on file  ?Other Topics Concern  ? Not on file  ?Social History Narrative  ? Not on file  ? ?Social Determinants of Health  ? ?Financial Resource Strain: Not on file  ?Food Insecurity: Not on file  ?Transportation Needs: Not on file  ?Physical Activity: Not on file  ?Stress: Not on file  ?Social Connections: Not on file  ?Intimate Partner Violence: Not on file  ?  ?Past Surgical History:  ?Procedure Laterality Date  ? ABDOMINAL HYSTERECTOMY    ? ABDOMINAL SURGERY    ? to remove fibroid tumors  ? BIOPSY  11/25/2017  ? Procedure: BIOPSY;  Surgeon: Rogene Houston, MD;  [facility];  Service: Endoscopy;;  gastric  ? CHOLECYSTECTOMY    ? COLONOSCOPY N/A 01/02/2013  ? Procedure: COLONOSCOPY;  Surgeon: Rogene Houston, MD;  [facility];  Service: Endoscopy;  Laterality: N/A;  825  ? ESOPHAGOGASTRODUODENOSCOPY N/A 11/25/2017  ? Procedure: ESOPHAGOGASTRODUODENOSCOPY (EGD);  Surgeon: Rogene Houston, MD;  [facility];  Service: Endoscopy;  Laterality: N/A;  12:00-moved to 11/11 @ 9:55am per Lelon Frohlich  ?  REFRACTIVE SURGERY Bilateral   ? TOTAL KNEE ARTHROPLASTY Left 05/10/2020  ? Procedure: TOTAL KNEE ARTHROPLASTY;  Surgeon: Renette Butters, MD;  [facility]: [facility];  Service: Orthopedics;  Laterality: Left;  ?  ? ? ?Current Outpatient Medications:  ?  acetaminophen (TYLENOL) 500 MG tablet, Take 2 tablets (1,000 mg total) by mouth every 6 (six) hours as needed for mild pain or moderate pain., Disp: 60 tablet, Rfl: 0 ?  ALPRAZolam (XANAX) 0.5 MG tablet, Take 0.25-0.5 mg by mouth at bedtime as needed for anxiety or sleep. , Disp: , Rfl:  ?  Cholecalciferol (VITAMIN D3) 1000 units CAPS, Take 1,000 Units by mouth daily. , Disp: , Rfl:  ?  diltiazem (TIAZAC) 360 MG 24 hr capsule, Take 360 mg by mouth daily., Disp: , Rfl:  ?  furosemide (LASIX) 40 MG tablet,  Take 40 mg by mouth daily as needed for fluid., Disp: , Rfl:  ?  hydrochlorothiazide (HYDRODIURIL) 25 MG tablet, Take 25 mg by mouth daily., Disp: , Rfl:  ?  metFORMIN (GLUCOPHAGE) 500 MG tablet, Take 250 mg by mouth 2 (two) times daily with a meal., Disp: , Rfl:  ?  ofloxacin (OCUFLOX) 0.3 % ophthalmic solution, Place 1 drop into the right eye 3 (three) times daily., Disp: , Rfl:  ?  olmesartan (BENICAR) 20 MG tablet, , Disp: , Rfl:  ?  Omega-3 Fatty Acids (FISH OIL) 1000 MG CAPS, Take 1,000 mg by mouth daily., Disp: , Rfl:  ?  rivaroxaban (XARELTO) 20 MG TABS tablet, Take 1 tablet (20 mg total) by mouth daily with supper., Disp: 30 tablet, Rfl: 11 ? ? ? ?Physical Exam: ?Blood pressure (!) 146/70, pulse 88, height '5\' 1"'$  (1.549 m), weight 158 lb (71.7 kg), SpO2 98 %.   ? ?Affect appropriate ?Healthy:  appears stated age ?HEENT: normal ?Neck supple with no adenopathy ?JVP normal no bruits no thyromegaly ?Lungs clear with no wheezing and good diaphragmatic motion ?Heart:  S1/S2 no murmur, no rub, gallop or click ?PMI normal ?Abdomen: benighn, BS positve, no tenderness, no AAA ?no bruit.  No HSM or HJR ?Distal pulses intact with no bruits ?No edema ?Neuro non-focal ?Skin warm and dry ?Post left TKR  ? ? ?Labs: ?  ?Lab Results  ?Component Value Date  ? WBC 10.6 (H) 05/02/2020  ? HGB 14.0 05/02/2020  ? HCT 42.7 05/02/2020  ? MCV 89.7 05/02/2020  ? PLT 340 05/02/2020  ? No results for input(s): NA, K, CL, CO2, BUN, CREATININE, CALCIUM, PROT, BILITOT, ALKPHOS, ALT, AST, GLUCOSE in the last 168 hours. ? ?Invalid input(s): LABALBU ?No results found for: CKTOTAL, CKMB, CKMBINDEX, TROPONINI No results found for: CHOL ?No results found for: HDL ?No results found for: [place] ?No results found for: TRIG ?No results found for: CHOLHDL ?No results found for: LDLDIRECT  ?  ?Radiology: ?ECHOCARDIOGRAM COMPLETE ? ?Result Date: 04/14/2021 ?   ECHOCARDIOGRAM REPORT   Patient Name:   SAREE KROGH Date of Exam: 04/14/2021 Medical Rec #:   [id number]     Height:       61.0 in Accession #:    [id number]    Weight:       163.4 lb Date of Birth:  25-Mar-1950     BSA:          1.733 m? Patient Age:    31 years      BP:           164/77 mmHg Patient Gender: F  HR:           87 bpm. Exam Location:  [facility] Procedure: 2D Echo, Cardiac Doppler and Color Doppler Indications:    I48.91 (ICD-10-CM) - Atrial fibrillation, unspecified type [facility])  History:        Patient has prior history of Echocardiogram examinations, most                 recent 12/16/2017. Hx of new onset afib.  Sonographer:    Alvino Chapel RCS Referring Phys: [place]  1. Left ventricular ejection fraction, by estimation, is 60 to 65%. The left ventricle has normal function. The left ventricle has no regional wall motion abnormalities. There is mild left ventricular hypertrophy. Left ventricular diastolic parameters are indeterminate.  2. Right ventricular systolic function is normal. The right ventricular size is normal. Tricuspid regurgitation signal is inadequate for assessing PA pressure.  3. Left atrial size was severely dilated.  4. Right atrial size was mildly dilated.  5. The mitral valve is normal in structure. Trivial mitral valve regurgitation. No evidence of mitral stenosis.  6. The aortic valve is tricuspid. Aortic valve regurgitation is mild. No aortic stenosis is present.  7. The inferior vena cava is normal in size with greater than 50% respiratory variability, suggesting right atrial pressure of 3 mmHg. FINDINGS  Left Ventricle: Left ventricular ejection fraction, by estimation, is 60 to 65%. The left ventricle has normal function. The left ventricle has no regional wall motion abnormalities. The left ventricular internal cavity size was normal in size. There is  mild left ventricular hypertrophy. Left ventricular diastolic parameters are indeterminate. Right Ventricle: The right ventricular size is normal. No increase in right ventricular wall  thickness. Right ventricular systolic function is normal. Tricuspid regurgitation signal is inadequate for assessing PA pressure. Left Atrium: Left atrial size was severely dilated. Right Atrium: Right

## 2021-04-21 NOTE — H&P (View-Only) (Signed)
?CARDIOLOGY CONSULT NOTE  ? ? ? ? ? ?Patient ID: ?Patricia Sharp ?MRN: [id number] ?DOB/AGE: 06/18/1950 71 y.o. ? ?Admit date: (Not on file) ?Referring Physician: Hilma Favors ?Primary Physician: Sharilyn Sites, MD ?Primary Cardiologist: New ?Reason for Consultation: New onset afib ? ?Active Problems: ?  * No active hospital problems. * ? ? ?HPI:  71 y.o. referred by Dr Hilma Favors for new onset afib 03/23/21 .  History of anxiety, Arthritis, DM and HTN CHADVASC 2 for HTN and DM.  Seen by me  in 12/06/17 for atypical chest pain TTE normal EF mild AR 12/16/17 and Normal lexiscan myovue same day breast attenuation EF 69%  ? ?She went to have right cataract done by Dr Venetia Maxon 2 weeks ago and was found to be in afib. Rate control is fine on cardizem Started on Xarelto but looks like PE dosing as she has been taking 15 mg bid. She is unaware of rhythm No chest pain, dyspnea palpitations or syncope No bleeding issues Wants left cataract done end of March and Dr Venetia Maxon indicates she does not need to hold Xarelto for it ? ?Discussed strategy of rate control vs conversion Favor latter at least one attempt  ? ?TTE 04/14/21 EF normal 60-65% severe LAE mild RAE and mild AR During echo was still in ?Afib  ? ?Risks of [place] including stroke, PPM, intubation discussed willing to proceed Wants to have it done at [facility] ? ? ?ROS ?All other systems reviewed and negative except as noted above ? ?Past Medical History:  ?Diagnosis Date  ? Anxiety   ? Arthritis   ? Depression   ? Diabetes mellitus without complication ([place])   ? Essential hypertension, benign 10/29/2017  ? Hypertension   ?  ?Family History  ?Problem Relation Age of Onset  ? Colon cancer Neg Hx   ?  ?Social History  ? ?Socioeconomic History  ? Marital status: Widowed  ?  Spouse name: Not on file  ? Number of children: Not on file  ? Years of education: Not on file  ? Highest education level: Not on file  ?Occupational History  ? Not on file  ?Tobacco Use  ? Smoking status: Never  ? Smokeless  tobacco: Never  ?Vaping Use  ? Vaping Use: Never used  ?Substance and Sexual Activity  ? Alcohol use: No  ? Drug use: No  ? Sexual activity: Not on file  ?Other Topics Concern  ? Not on file  ?Social History Narrative  ? Not on file  ? ?Social Determinants of Health  ? ?Financial Resource Strain: Not on file  ?Food Insecurity: Not on file  ?Transportation Needs: Not on file  ?Physical Activity: Not on file  ?Stress: Not on file  ?Social Connections: Not on file  ?Intimate Partner Violence: Not on file  ?  ?Past Surgical History:  ?Procedure Laterality Date  ? ABDOMINAL HYSTERECTOMY    ? ABDOMINAL SURGERY    ? to remove fibroid tumors  ? BIOPSY  11/25/2017  ? Procedure: BIOPSY;  Surgeon: Rogene Houston, MD;  Location: [facility];  Service: Endoscopy;;  gastric  ? CHOLECYSTECTOMY    ? COLONOSCOPY N/A 01/02/2013  ? Procedure: COLONOSCOPY;  Surgeon: Rogene Houston, MD;  Location: [facility];  Service: Endoscopy;  Laterality: N/A;  825  ? ESOPHAGOGASTRODUODENOSCOPY N/A 11/25/2017  ? Procedure: ESOPHAGOGASTRODUODENOSCOPY (EGD);  Surgeon: Rogene Houston, MD;  Location: [facility];  Service: Endoscopy;  Laterality: N/A;  12:00-moved to 11/11 @ 9:55am per Lelon Frohlich  ?  REFRACTIVE SURGERY Bilateral   ? TOTAL KNEE ARTHROPLASTY Left 05/10/2020  ? Procedure: TOTAL KNEE ARTHROPLASTY;  Surgeon: Renette Butters, MD;  Location: [facility];  Service: Orthopedics;  Laterality: Left;  ?  ? ? ?Current Outpatient Medications:  ?  acetaminophen (TYLENOL) 500 MG tablet, Take 2 tablets (1,000 mg total) by mouth every 6 (six) hours as needed for mild pain or moderate pain., Disp: 60 tablet, Rfl: 0 ?  ALPRAZolam (XANAX) 0.5 MG tablet, Take 0.25-0.5 mg by mouth at bedtime as needed for anxiety or sleep. , Disp: , Rfl:  ?  Cholecalciferol (VITAMIN D3) 1000 units CAPS, Take 1,000 Units by mouth daily. , Disp: , Rfl:  ?  diltiazem (TIAZAC) 360 MG 24 hr capsule, Take 360 mg by mouth daily., Disp: , Rfl:  ?  furosemide (LASIX) 40 MG tablet,  Take 40 mg by mouth daily as needed for fluid., Disp: , Rfl:  ?  hydrochlorothiazide (HYDRODIURIL) 25 MG tablet, Take 25 mg by mouth daily., Disp: , Rfl:  ?  metFORMIN (GLUCOPHAGE) 500 MG tablet, Take 250 mg by mouth 2 (two) times daily with a meal., Disp: , Rfl:  ?  ofloxacin (OCUFLOX) 0.3 % ophthalmic solution, Place 1 drop into the right eye 3 (three) times daily., Disp: , Rfl:  ?  olmesartan (BENICAR) 20 MG tablet, , Disp: , Rfl:  ?  Omega-3 Fatty Acids (FISH OIL) 1000 MG CAPS, Take 1,000 mg by mouth daily., Disp: , Rfl:  ?  rivaroxaban (XARELTO) 20 MG TABS tablet, Take 1 tablet (20 mg total) by mouth daily with supper., Disp: 30 tablet, Rfl: 11 ? ? ? ?Physical Exam: ?Blood pressure (!) 146/70, pulse 88, height '5\' 1"'$  (1.549 m), weight 158 lb (71.7 kg), SpO2 98 %.   ? ?Affect appropriate ?Healthy:  appears stated age ?HEENT: normal ?Neck supple with no adenopathy ?JVP normal no bruits no thyromegaly ?Lungs clear with no wheezing and good diaphragmatic motion ?Heart:  S1/S2 no murmur, no rub, gallop or click ?PMI normal ?Abdomen: benighn, BS positve, no tenderness, no AAA ?no bruit.  No HSM or HJR ?Distal pulses intact with no bruits ?No edema ?Neuro non-focal ?Skin warm and dry ?Post left TKR  ? ? ?Labs: ?  ?Lab Results  ?Component Value Date  ? WBC 10.6 (H) 05/02/2020  ? HGB 14.0 05/02/2020  ? HCT 42.7 05/02/2020  ? MCV 89.7 05/02/2020  ? PLT 340 05/02/2020  ? No results for input(s): NA, K, CL, CO2, BUN, CREATININE, CALCIUM, PROT, BILITOT, ALKPHOS, ALT, AST, GLUCOSE in the last 168 hours. ? ?Invalid input(s): LABALBU ?No results found for: CKTOTAL, CKMB, CKMBINDEX, TROPONINI No results found for: CHOL ?No results found for: HDL ?No results found for: [place] ?No results found for: TRIG ?No results found for: CHOLHDL ?No results found for: LDLDIRECT  ?  ?Radiology: ?ECHOCARDIOGRAM COMPLETE ? ?Result Date: 04/14/2021 ?   ECHOCARDIOGRAM REPORT   Patient Name:   Patricia Sharp Date of Exam: 04/14/2021 Medical Rec #:   [id number]     Height:       61.0 in Accession #:    [id number]    Weight:       163.4 lb Date of Birth:  1950/07/14     BSA:          1.733 m? Patient Age:    60 years      BP:           164/77 mmHg Patient Gender: F  HR:           87 bpm. Exam Location:  [facility] Procedure: 2D Echo, Cardiac Doppler and Color Doppler Indications:    I48.91 (ICD-10-CM) - Atrial fibrillation, unspecified type [facility])  History:        Patient has prior history of Echocardiogram examinations, most                 recent 12/16/2017. Hx of new onset afib.  Sonographer:    Alvino Chapel RCS Referring Phys: [place]  1. Left ventricular ejection fraction, by estimation, is 60 to 65%. The left ventricle has normal function. The left ventricle has no regional wall motion abnormalities. There is mild left ventricular hypertrophy. Left ventricular diastolic parameters are indeterminate.  2. Right ventricular systolic function is normal. The right ventricular size is normal. Tricuspid regurgitation signal is inadequate for assessing PA pressure.  3. Left atrial size was severely dilated.  4. Right atrial size was mildly dilated.  5. The mitral valve is normal in structure. Trivial mitral valve regurgitation. No evidence of mitral stenosis.  6. The aortic valve is tricuspid. Aortic valve regurgitation is mild. No aortic stenosis is present.  7. The inferior vena cava is normal in size with greater than 50% respiratory variability, suggesting right atrial pressure of 3 mmHg. FINDINGS  Left Ventricle: Left ventricular ejection fraction, by estimation, is 60 to 65%. The left ventricle has normal function. The left ventricle has no regional wall motion abnormalities. The left ventricular internal cavity size was normal in size. There is  mild left ventricular hypertrophy. Left ventricular diastolic parameters are indeterminate. Right Ventricle: The right ventricular size is normal. No increase in right ventricular wall  thickness. Right ventricular systolic function is normal. Tricuspid regurgitation signal is inadequate for assessing PA pressure. Left Atrium: Left atrial size was severely dilated. Right Atrium: Right

## 2021-04-28 ENCOUNTER — Ambulatory Visit: Payer: Medicare PPO | Admitting: Cardiovascular Disease

## 2021-04-28 ENCOUNTER — Other Ambulatory Visit (HOSPITAL_COMMUNITY)
Admission: RE | Admit: 2021-04-28 | Discharge: 2021-04-28 | Disposition: A | Discharge status: Home / Self Care | Payer: Medicare PPO | Source: Ambulatory Visit | Attending: Cardiovascular Disease | Admitting: Cardiovascular Disease

## 2021-04-28 ENCOUNTER — Encounter: Payer: Self-pay | Admitting: Cardiovascular Disease

## 2021-04-28 VITALS — BP 146/70 | HR 88 | Ht 61.0 in | Wt 158.0 lb

## 2021-04-28 DIAGNOSIS — Z5181 Encounter for therapeutic drug level monitoring: Secondary | ICD-10-CM | POA: Diagnosis not present

## 2021-04-28 DIAGNOSIS — Z7901 Long term (current) use of anticoagulants: Secondary | ICD-10-CM | POA: Diagnosis not present

## 2021-04-28 DIAGNOSIS — I4891 Unspecified atrial fibrillation: Secondary | ICD-10-CM

## 2021-04-28 DIAGNOSIS — I10 Essential (primary) hypertension: Secondary | ICD-10-CM | POA: Diagnosis not present

## 2021-04-28 LAB — BASIC METABOLIC PANEL
Anion gap: 9 (ref 5–15)
BUN: 19 mg/dL (ref 8–23)
CO2: 29 mmol/L (ref 22–32)
Calcium: 9 mg/dL (ref 8.9–10.3)
Chloride: 99 mmol/L (ref 98–111)
Creatinine, Ser: 0.91 mg/dL (ref 0.44–1.00)
GFR, Estimated: >60 mL/min (ref >60)
Glucose, Bld: 88 mg/dL (ref 70–99)
Potassium: 3.2 mmol/L — ABNORMAL LOW (ref 3.5–5.1)
Sodium: 137 mmol/L (ref 135–145)

## 2021-04-28 LAB — CBC
HCT: 42.1 % (ref 36.0–46.0)
Hemoglobin: 14.2 g/dL (ref 12.0–15.0)
MCH: 30.1 pg (ref 26.0–34.0)
MCHC: 33.7 g/dL (ref 30.0–36.0)
MCV: 89.4 fL (ref 80.0–100.0)
Platelets: 321 10*3/uL (ref 150–400)
RBC: 4.71 MIL/uL (ref 3.87–5.11)
RDW: 13.9 % (ref 11.5–15.5)
WBC: 10.5 10*3/uL (ref 4.0–10.5)
nRBC: 0 % (ref 0.0–0.2)

## 2021-04-28 NOTE — Patient Instructions (Signed)
Medication Instructions:  ?Your physician recommends that you continue on your current medications as directed. Please refer to the Current Medication list given to you today. ? ?*If you need a refill on your cardiac medications before your next appointment, please call your pharmacy* ? ? ?Lab Work: ?Your physician recommends that you return for lab work in: Today ( [place], BMET)  ? ?If you have labs (blood work) drawn today and your tests are completely normal, you will receive your results only by: ?MyChart Message (if you have MyChart) OR ?A paper copy in the mail ?If you have any lab test that is abnormal or we need to change your treatment, we will call you to review the results. ? ? ?Testing/Procedures: ?Your physician has recommended that you have a Cardioversion (DCCV). Electrical Cardioversion uses a jolt of electricity to your heart either through paddles or wired patches attached to your chest. This is a controlled, usually prescheduled, procedure. Defibrillation is done under light anesthesia in the hospital, and you usually go home the day of the procedure. This is done to get your heart back into a normal rhythm. You are not awake for the procedure. Please see the instruction sheet given to you today. ? ? ? ?Follow-Up: ?At [facility], you and your health needs are our priority.  As part of our continuing mission to provide you with exceptional heart care, we have created designated Provider Care Teams.  These Care Teams include your primary Cardiologist (physician) and Advanced Practice Providers (APPs -  Physician Assistants and Nurse Practitioners) who all work together to provide you with the care you need, when you need it. ? ?We recommend signing up for the patient portal called "MyChart".  Sign up information is provided on this After Visit Summary.  MyChart is used to connect with patients for Virtual Visits (Telemedicine).  Patients are able to view lab/test results, encounter notes, upcoming  appointments, etc.  Non-urgent messages can be sent to your provider as well.   ?To learn more about what you can do with MyChart, go to [email or web address].   ? ?Your next appointment:   ?2 -3 week(s) after Cardioversion  ? ?The format for your next appointment:   ?In Person ? ?Provider:   ?Jenkins Rouge, MD  ? ? ?Other Instructions ?Thank you for choosing [facility]! ?  ? ? ?Important Information About [place] ? ? ? ? ?  ?

## 2021-05-01 ENCOUNTER — Telehealth: Payer: Self-pay | Admitting: Cardiovascular Disease

## 2021-05-01 ENCOUNTER — Encounter: Payer: Self-pay | Admitting: *Deleted

## 2021-05-01 ENCOUNTER — Other Ambulatory Visit: Payer: Self-pay | Admitting: Cardiovascular Disease

## 2021-05-01 DIAGNOSIS — Z79899 Other long term (current) drug therapy: Secondary | ICD-10-CM

## 2021-05-01 DIAGNOSIS — I480 Paroxysmal atrial fibrillation: Secondary | ICD-10-CM

## 2021-05-01 MED ORDER — POTASSIUM CHLORIDE ER 10 MEQ PO TBCR: 10.0000 meq | EXTENDED_RELEASE_TABLET | Freq: Every day | ORAL | 3 refills | Status: DC

## 2021-05-01 NOTE — Telephone Encounter (Signed)
Per Dr.Nishan lab result message: "Can repeat labs on visit to pre cardioversion /anesthesia" ? ? ? ? ?BMET ordered ?

## 2021-05-01 NOTE — Telephone Encounter (Signed)
Pt calling regarding test results. Pt also has questions regarding upcoming procedure on 05/09/21.Please advise ?

## 2021-05-01 NOTE — Telephone Encounter (Signed)
I spoke with pt and went over her Cardioversion instructions...  she has been unable to access her My Chart..  ? ?She verbalized understanding of her Lab results and to take the K.  ? ?Pt will need labs morning of her Cardioversion per Dr. Kyla Balzarine note.  ?

## 2021-05-04 NOTE — Patient Instructions (Signed)
? ? ? ? ? Patricia Sharp ? 05/04/2021  ?  ? '@PREFPERIOPPHARMACY'$ @ ? ? Your procedure is scheduled on  05/09/2021. ? ? Report to [facility] at  0800 A.M. ? ? Call this number if you have problems the morning of surgery: ? [phone] ? ? Remember: ? Do not eat or drink after midnight. ?  ? ?  DO NOT miss any doses of your Xarelto before your procedure. ? ?  ? Take these medicines the morning of surgery with A SIP OF WATER  ? ?                                            Diltiazem ?  ? Do not wear jewelry, make-up or nail polish. ? Do not wear lotions, powders, or perfumes, or deodorant. ? Do not shave 48 hours prior to surgery.  Men may shave face and neck. ? Do not bring valuables to the hospital. ? [facility] is not responsible for any belongings or valuables. ? ?Contacts, dentures or bridgework may not be worn into surgery.  Leave your suitcase in the car.  After surgery it may be brought to your room. ? ?For patients admitted to the hospital, discharge time will be determined by your treatment team. ? ?Patients discharged the day of surgery will not be allowed to drive home and must have someone with them for 24 hours after the procedure.  ? ? ?Special instructions:   DO NOT smoke tobacco or vape for 24 hours before your procedure. ? ?Please read over the following fact sheets that you were given. ?Anesthesia Post-op Instructions and Care and Recovery After Surgery ?  ? ? ? Electrical Cardioversion ?Electrical cardioversion is the delivery of a jolt of electricity to restore a normal rhythm to the heart. A rhythm that is too fast or is not regular keeps the heart from pumping well. In this procedure, sticky patches or metal paddles are placed on the chest to deliver electricity to the heart from a device. ?This procedure may be done in an emergency if: ?There is low or no blood pressure as a result of the heart rhythm. ?Normal rhythm must be restored as fast as possible to protect the brain and heart from further  damage. ?It may save a life. ?This may also be a scheduled procedure for irregular or fast heart rhythms that are not immediately life-threatening. ?Tell a health care provider about: ?Any allergies you have. ?All medicines you are taking, including vitamins, herbs, eye drops, creams, and over-the-counter medicines. ?Any problems you or family members have had with anesthetic medicines. ?Any blood disorders you have. ?Any surgeries you have had. ?Any medical conditions you have. ?Whether you are pregnant or may be pregnant. ?What are the risks? ?Generally, this is a safe procedure. However, problems may occur, including: ?Allergic reactions to medicines. ?A blood clot that breaks free and travels to other parts of your body. ?The possible return of an abnormal heart rhythm within hours or days after the procedure. ?Your heart stopping (cardiac arrest). This is rare. ?What happens before the procedure? ?Medicines ?Your health care provider may have you start taking: ?Blood-thinning medicines (anticoagulants) so your blood does not clot as easily. ?Medicines to help stabilize your heart rate and rhythm. ?Ask your health care provider about: ?Changing or stopping your regular medicines. This is especially important if you  are taking diabetes medicines or blood thinners. ?Taking medicines such as aspirin and ibuprofen. These medicines can thin your blood. Do not take these medicines unless your health care provider tells you to take them. ?Taking over-the-counter medicines, vitamins, herbs, and supplements. ?General instructions ?Follow instructions from your health care provider about eating or drinking restrictions. ?Plan to have someone take you home from the hospital or clinic. ?If you will be going home right after the procedure, plan to have someone with you for 24 hours. ?Ask your health care provider what steps will be taken to help prevent infection. These may include washing your skin with a germ-killing  soap. ?What happens during the procedure? ? ?An IV will be inserted into one of your veins. ?Sticky patches (electrodes) or metal paddles may be placed on your chest. ?You will be given a medicine to help you relax (sedative). ?An electrical shock will be delivered. ?The procedure may vary among health care providers and hospitals. ?What can I expect after the procedure? ?Your blood pressure, heart rate, breathing rate, and blood oxygen level will be monitored until you leave the hospital or clinic. ?Your heart rhythm will be watched to make sure it does not change. ?You may have some redness on the skin where the shocks were given. ?Follow these instructions at home: ?Do not drive for 24 hours if you were given a sedative during your procedure. ?Take over-the-counter and prescription medicines only as told by your health care provider. ?Ask your health care provider how to check your pulse. Check it often. ?Rest for 48 hours after the procedure or as told by your health care provider. ?Avoid or limit your caffeine use as told by your health care provider. ?Keep all follow-up visits as told by your health care provider. This is important. ?Contact a health care provider if: ?You feel like your heart is beating too quickly or your pulse is not regular. ?You have a serious muscle cramp that does not go away. ?Get help right away if: ?You have discomfort in your chest. ?You are dizzy or you feel faint. ?You have trouble breathing or you are short of breath. ?Your speech is slurred. ?You have trouble moving an arm or leg on one side of your body. ?Your fingers or toes turn cold or blue. ?Summary ?Electrical cardioversion is the delivery of a jolt of electricity to restore a normal rhythm to the heart. ?This procedure may be done right away in an emergency or may be a scheduled procedure if the condition is not an emergency. ?Generally, this is a safe procedure. ?After the procedure, check your pulse often as told by  your health care provider. ?This information is not intended to replace advice given to you by your health care provider. Make sure you discuss any questions you have with your health care provider. ?Document Revised: 12/01/2020 Document Reviewed: 08/04/2018 ?Elsevier Patient Education ? [place]. ?Monitored Anesthesia Care, Care After ?This sheet gives you information about how to care for yourself after your procedure. Your health care provider may also give you more specific instructions. If you have problems or questions, contact your health care provider. ?What can I expect after the procedure? ?After the procedure, it is common to have: ?Tiredness. ?Forgetfulness about what happened after the procedure. ?Impaired judgment for important decisions. ?Nausea or vomiting. ?Some difficulty with balance. ?Follow these instructions at home: ?For the time period you were told by your health care provider: ? ?  ? ?Rest as needed. ?  Do not participate in activities where you could fall or become injured. ?Do not drive or use machinery. ?Do not drink alcohol. ?Do not take sleeping pills or medicines that cause drowsiness. ?Do not make important decisions or sign legal documents. ?Do not take care of children on your own. ?Eating and drinking ?Follow the diet that is recommended by your health care provider. ?Drink enough fluid to keep your urine pale yellow. ?If you vomit: ?Drink water, juice, or soup when you can drink without vomiting. ?Make sure you have little or no nausea before eating solid foods. ?General instructions ?Have a responsible adult stay with you for the time you are told. It is important to have someone help care for you until you are awake and alert. ?Take over-the-counter and prescription medicines only as told by your health care provider. ?If you have sleep apnea, surgery and certain medicines can increase your risk for breathing problems. Follow instructions from your health care provider  about wearing your sleep device: ?Anytime you are sleeping, including during daytime naps. ?While taking prescription pain medicines, sleeping medicines, or medicines that make you drowsy. ?Avoid smoking. ?Keep

## 2021-05-05 ENCOUNTER — Encounter (HOSPITAL_COMMUNITY): Payer: Self-pay

## 2021-05-05 ENCOUNTER — Encounter (HOSPITAL_COMMUNITY)
Admission: RE | Admit: 2021-05-05 | Discharge: 2021-05-05 | Disposition: A | Discharge status: Home / Self Care | Payer: Medicare PPO | Source: Ambulatory Visit | Attending: Cardiology | Admitting: Cardiology

## 2021-05-05 VITALS — BP 180/71 | HR 74 | Temp 98.4°F | Resp 18 | Ht 61.0 in | Wt 158.0 lb

## 2021-05-05 DIAGNOSIS — Z01812 Encounter for preprocedural laboratory examination: Secondary | ICD-10-CM | POA: Insufficient documentation

## 2021-05-05 DIAGNOSIS — I480 Paroxysmal atrial fibrillation: Secondary | ICD-10-CM | POA: Diagnosis not present

## 2021-05-05 DIAGNOSIS — Z79899 Other long term (current) drug therapy: Secondary | ICD-10-CM | POA: Diagnosis not present

## 2021-05-05 HISTORY — DX: Cardiac arrhythmia, unspecified: I49.9

## 2021-05-05 HISTORY — DX: Anemia, unspecified: D64.9

## 2021-05-05 LAB — BASIC METABOLIC PANEL
Anion gap: 10 (ref 5–15)
BUN: 15 mg/dL (ref 8–23)
CO2: 27 mmol/L (ref 22–32)
Calcium: 9.2 mg/dL (ref 8.9–10.3)
Chloride: 101 mmol/L (ref 98–111)
Creatinine, Ser: 0.92 mg/dL (ref 0.44–1.00)
GFR, Estimated: >60 mL/min (ref >60)
Glucose, Bld: 91 mg/dL (ref 70–99)
Potassium: 4.1 mmol/L (ref 3.5–5.1)
Sodium: 138 mmol/L (ref 135–145)

## 2021-05-05 LAB — PROTIME-INR
INR: 2.4 — ABNORMAL HIGH (ref 0.8–1.2)
Prothrombin Time: 25.5 seconds — ABNORMAL HIGH (ref 11.4–15.2)

## 2021-05-09 ENCOUNTER — Encounter (HOSPITAL_COMMUNITY)
Admission: RE | Disposition: A | Discharge status: Home / Self Care | Payer: Self-pay | Source: Home / Self Care | Attending: Cardiology

## 2021-05-09 ENCOUNTER — Ambulatory Visit (HOSPITAL_COMMUNITY): Payer: Medicare PPO | Admitting: Anesthesiology

## 2021-05-09 ENCOUNTER — Other Ambulatory Visit: Payer: Self-pay

## 2021-05-09 ENCOUNTER — Encounter (HOSPITAL_COMMUNITY): Payer: Self-pay | Admitting: Cardiology

## 2021-05-09 ENCOUNTER — Ambulatory Visit (HOSPITAL_BASED_OUTPATIENT_CLINIC_OR_DEPARTMENT_OTHER): Payer: Medicare PPO | Admitting: Anesthesiology

## 2021-05-09 ENCOUNTER — Telehealth: Payer: Self-pay | Admitting: Cardiovascular Disease

## 2021-05-09 ENCOUNTER — Ambulatory Visit (HOSPITAL_COMMUNITY)
Admission: RE | Admit: 2021-05-09 | Discharge: 2021-05-09 | Disposition: A | Discharge status: Home / Self Care | Payer: Medicare PPO | Attending: Cardiology | Admitting: Cardiology

## 2021-05-09 DIAGNOSIS — F32A Depression, unspecified: Secondary | ICD-10-CM | POA: Insufficient documentation

## 2021-05-09 DIAGNOSIS — I480 Paroxysmal atrial fibrillation: Secondary | ICD-10-CM | POA: Diagnosis not present

## 2021-05-09 DIAGNOSIS — I10 Essential (primary) hypertension: Secondary | ICD-10-CM

## 2021-05-09 DIAGNOSIS — I351 Nonrheumatic aortic (valve) insufficiency: Secondary | ICD-10-CM | POA: Insufficient documentation

## 2021-05-09 DIAGNOSIS — Z7984 Long term (current) use of oral hypoglycemic drugs: Secondary | ICD-10-CM | POA: Insufficient documentation

## 2021-05-09 DIAGNOSIS — E119 Type 2 diabetes mellitus without complications: Secondary | ICD-10-CM | POA: Insufficient documentation

## 2021-05-09 DIAGNOSIS — Z79899 Other long term (current) drug therapy: Secondary | ICD-10-CM | POA: Diagnosis not present

## 2021-05-09 DIAGNOSIS — F419 Anxiety disorder, unspecified: Secondary | ICD-10-CM | POA: Diagnosis not present

## 2021-05-09 DIAGNOSIS — I4891 Unspecified atrial fibrillation: Secondary | ICD-10-CM

## 2021-05-09 DIAGNOSIS — M1990 Unspecified osteoarthritis, unspecified site: Secondary | ICD-10-CM | POA: Insufficient documentation

## 2021-05-09 DIAGNOSIS — I4819 Other persistent atrial fibrillation: Secondary | ICD-10-CM

## 2021-05-09 DIAGNOSIS — Z7901 Long term (current) use of anticoagulants: Secondary | ICD-10-CM | POA: Insufficient documentation

## 2021-05-09 HISTORY — PX: CARDIOVERSION: SHX1299

## 2021-05-09 LAB — GLUCOSE, CAPILLARY: Glucose-Capillary: 118 mg/dL — ABNORMAL HIGH (ref 70–99)

## 2021-05-09 SURGERY — CARDIOVERSION
Anesthesia: General

## 2021-05-09 MED ORDER — ORAL CARE MOUTH RINSE: 15.0000 mL | Freq: Once | OROMUCOSAL | Status: DC

## 2021-05-09 MED ORDER — PROPOFOL 10 MG/ML IV BOLUS
INTRAVENOUS | Status: DC | PRN
  Administered 2021-05-09 (×2): 50 mg via INTRAVENOUS

## 2021-05-09 MED ORDER — CHLORHEXIDINE GLUCONATE 0.12 % MT SOLN: 15.0000 mL | Freq: Once | OROMUCOSAL | Status: DC

## 2021-05-09 MED ORDER — LACTATED RINGERS IV SOLN: INTRAVENOUS | Status: DC

## 2021-05-09 MED ORDER — SODIUM CHLORIDE 0.9 % IV SOLN: INTRAVENOUS | Status: DC

## 2021-05-09 MED ORDER — LIDOCAINE HCL (CARDIAC) PF 100 MG/5ML IV SOSY
PREFILLED_SYRINGE | INTRAVENOUS | Status: DC | PRN
  Administered 2021-05-09: 50 mg via INTRAVENOUS

## 2021-05-09 NOTE — Interval H&P Note (Signed)
History and Physical Interval Note: ? ?05/09/2021 ?8:51 AM ? ?Patricia Sharp  has presented today for surgery, with the diagnosis of A-FIB.  The various methods of treatment have been discussed with the patient and family. After consideration of risks, benefits and other options for treatment, the patient has consented to  Procedure(s): ?CARDIOVERSION (N/A) as a surgical intervention.  The patient's history has been reviewed, patient examined, no change in status, stable for surgery.  I have reviewed the patient's chart and labs.  Questions were answered to the patient's satisfaction.   ? ? ?Patricia Sharp ? ? ?

## 2021-05-09 NOTE — CV Procedure (Signed)
? ?  Electrical Cardioversion Procedure Note ?Patricia Sharp ?[id number] ?17-Nov-1950 ? ?Procedure: Electrical Cardioversion ?Indications:  Atrial Fibrillation ? ?Time Out: Verified patient identification, verified procedure,medications/allergies/relevent history reviewed, required imaging and test results available.  Performed ? ?Procedure Details ? ?The patient was NPO after midnight. Anesthesia was administered at the beside  by [facility] with '50mg'$  Lidocaine and '100mg'$  of propofol.  Cardioversion was done with synchronized biphasic defibrillation with AP pads with 150watts.  The patient converted to normal sinus rhythm. The patient tolerated the procedure well  ? ?IMPRESSION: ? ?Successful cardioversion of atrial fibrillation ? ? ? ?Patricia Sharp ?05/09/2021, 8:53 AM ?  ?

## 2021-05-09 NOTE — Addendum Note (Signed)
Addendum  created 05/09/21 1440 by Jonna Munro, CRNA  ? Intraprocedure Event edited, Intraprocedure Staff edited  ?  ?

## 2021-05-09 NOTE — Anesthesia Postprocedure Evaluation (Signed)
Anesthesia Post Note ? ?Patient: DESTYNIE TOOMEY ? ?Procedure(s) Performed: CARDIOVERSION ? ?Patient location during evaluation: Phase II ?Anesthesia Type: General ?Level of consciousness: awake and alert and oriented ?Pain management: pain level controlled ?Vital Signs Assessment: post-procedure vital signs reviewed and stable ?Respiratory status: spontaneous breathing, nonlabored ventilation and respiratory function stable ?Cardiovascular status: blood pressure returned to baseline and stable ?Postop Assessment: no apparent nausea or vomiting ?Anesthetic complications: no ? ? ?No notable events documented. ? ? ?Last Vitals:  ?Vitals:  ? 05/09/21 1015 05/09/21 1048  ?BP: (!) 164/93 (!) 178/93  ?Pulse: 70 84  ?Resp: 17 15  ?Temp:  36.7 ?C  ?SpO2: 100% 97%  ?  ?Last Pain:  ?Vitals:  ? 05/09/21 1048  ?TempSrc: Oral  ?PainSc: 0-No pain  ? ? ?  ?  ?  ?  ?  ?  ? ?Aylanie Cubillos C [place] ? ? ? ? ?

## 2021-05-09 NOTE — Telephone Encounter (Signed)
Patient states she received a call from someone today. I did not see any notes. ?

## 2021-05-09 NOTE — Anesthesia Preprocedure Evaluation (Signed)
Anesthesia Evaluation  ?Patient identified by MRN, date of birth, ID band ?Patient awake ? ? ? ?Reviewed: ?Allergy & Precautions, NPO status , Patient's Chart, lab work & pertinent test results ? ?Airway ?Mallampati: III ? ?TM Distance: >3 FB ?Neck ROM: Full ? ?Mouth opening: Limited Mouth Opening ? Dental ? ?(+) Dental Advisory Given, Teeth Intact ?  ?Pulmonary ?neg pulmonary ROS,  ?  ?Pulmonary exam normal ?breath sounds clear to auscultation ? ? ? ? ? ? Cardiovascular ?Exercise Tolerance: Good ?hypertension, Pt. on medications ?+ dysrhythmias Atrial Fibrillation  ?Rhythm:Irregular Rate:Normal ? ?1. Left ventricular ejection fraction, by estimation, is 60 to 65%. The left ventricle has normal function. The left ventricle has no regional wall motion abnormalities. There is mild left ventricular hypertrophy.  ?Left ventricular diastolic parameters are indeterminate.  ??2. Right ventricular systolic function is normal. The right ventricular size is normal. Tricuspid regurgitation signal is inadequate for assessing PA pressure.  ??3. Left atrial size was severely dilated.  ??4. Right atrial size was mildly dilated.  ??5. The mitral valve is normal in structure. Trivial mitral valve regurgitation. No evidence of mitral stenosis.  ??6. The aortic valve is tricuspid. Aortic valve regurgitation is mild. No aortic stenosis is present.  ??7. The inferior vena cava is normal in size with greater than 50% respiratory variability, suggesting right atrial pressure of 3 mmHg.  ?  ?Neuro/Psych ?PSYCHIATRIC DISORDERS Anxiety Depression negative neurological ROS ?   ? GI/Hepatic ?negative GI ROS, Neg liver ROS,   ?Endo/Other  ?diabetes, Well Controlled, Type 2, Oral Hypoglycemic Agents ? Renal/GU ?negative Renal ROS  ?negative genitourinary ?  ?Musculoskeletal ? ?(+) Arthritis , Osteoarthritis,   ? Abdominal ?  ?Peds ?negative pediatric ROS ?(+)  Hematology ? ?(+) Blood dyscrasia, anemia ,    ?Anesthesia Other Findings ? ? Reproductive/Obstetrics ?negative OB ROS ? ?  ? ? ? ? ? ? ? ? ? ? ? ? ? ?  ?  ? ? ? ? ? ? ? ?Anesthesia Physical ?Anesthesia Plan ? ?ASA: 3 ? ?Anesthesia Plan: General  ? ?Post-op Pain Management: Minimal or no pain anticipated  ? ?Induction: Intravenous ? ?PONV Risk Score and Plan: Propofol infusion ? ?Airway Management Planned: Nasal Cannula and Natural Airway ? ?Additional Equipment:  ? ?Intra-op Plan:  ? ?Post-operative Plan:  ? ?Informed Consent: I have reviewed the patients History and Physical, chart, labs and discussed the procedure including the risks, benefits and alternatives for the proposed anesthesia with the patient or authorized representative who has indicated his/her understanding and acceptance.  ? ? ? ?Dental advisory given ? ?Plan Discussed with: CRNA and Surgeon ? ?Anesthesia Plan Comments:   ? ? ? ? ? ? ?Anesthesia Quick Evaluation ? ?

## 2021-05-09 NOTE — Progress Notes (Signed)
Electrical Cardioversion Procedure Note ?[place] Jack Galea ?[id number] ?[birth year] ? ?Procedure: Electrical Cardioversion ?Indications:  Atrial Fibrillation ? ?Procedure Details ?Consent: Risks of procedure as well as the alternatives and risks of each were explained to the (patient/caregiver).  Consent for procedure obtained. ?Time Out: Verified patient identification, verified procedure, site/side was marked, verified correct patient position, special equipment/implants available, medications/allergies/relevent history reviewed, required imaging and test results available.  Performed ? ?Patient placed on cardiac monitor, pulse oximetry, supplemental oxygen as necessary.  ?Sedation given: CRNA ? ?Pacer pads placed anterior and posterior chest. ? ?Cardioverted 1 time(s).  ?Cardioverted at 150J. ? ?Evaluation ?Findings: Post procedure EKG shows: NSR ?Complications: None ?Patient did tolerate procedure well. ? ? ?[place] L Destany Severns ?05/09/2021, 9:56 AM ? ? ? ?

## 2021-05-09 NOTE — Transfer of Care (Signed)
Immediate Anesthesia Transfer of Care Note ? ?Patient: Patricia Sharp ? ?Procedure(s) Performed: CARDIOVERSION ? ?Patient Location: PACU ? ?Anesthesia Type:General ? ?Level of Consciousness: awake, alert , oriented and patient cooperative ? ?Airway & Oxygen Therapy: Patient Spontanous Breathing and Patient connected to nasal cannula oxygen ? ?Post-op Assessment: Report given to RN, Post -op Vital signs reviewed and stable and Patient moving all extremities X 4 ? ?Post vital signs: Reviewed and stable ? ?Last Vitals:  ?Vitals Value Taken Time  ?BP    ?Temp    ?Pulse    ?Resp    ?SpO2    ? ? ?Last Pain:  ?Vitals:  ? 05/09/21 0813  ?TempSrc: Oral  ?PainSc: 0-No pain  ?   ? ?Patients Stated Pain Goal: 5 (05/09/21 0813) ? ?Complications: No notable events documented. ?

## 2021-05-10 ENCOUNTER — Telehealth: Payer: Self-pay | Admitting: Cardiovascular Disease

## 2021-05-10 ENCOUNTER — Encounter (HOSPITAL_COMMUNITY): Payer: Self-pay | Admitting: Cardiology

## 2021-05-10 NOTE — Telephone Encounter (Signed)
New Message: ? ? ? ?Patient says she have a cough and running nose. She wants to know if she can take this medicine(Benzonatate 200 mg? ?

## 2021-05-10 NOTE — Telephone Encounter (Signed)
I spoke with patient and explained that we did not call her. She said someone left a message but she did not understand it. She thinks it may have been short stay. ?

## 2021-05-11 NOTE — Telephone Encounter (Signed)
Pt advised she may take benzonatate if needed. ?Patient verbalized understanding and agreeable to plan.  ? ?

## 2021-05-11 NOTE — Telephone Encounter (Signed)
It is fine to take the benzonatate with her current medications ?

## 2021-05-15 ENCOUNTER — Other Ambulatory Visit (HOSPITAL_COMMUNITY): Payer: Self-pay | Admitting: Family Medicine

## 2021-05-15 ENCOUNTER — Ambulatory Visit (HOSPITAL_COMMUNITY)
Admission: RE | Admit: 2021-05-15 | Discharge: 2021-05-15 | Disposition: A | Discharge status: Home / Self Care | Payer: Medicare PPO | Source: Ambulatory Visit | Attending: Family Medicine | Admitting: Family Medicine

## 2021-05-15 DIAGNOSIS — E6609 Other obesity due to excess calories: Secondary | ICD-10-CM | POA: Diagnosis not present

## 2021-05-15 DIAGNOSIS — R059 Cough, unspecified: Secondary | ICD-10-CM | POA: Diagnosis not present

## 2021-05-15 DIAGNOSIS — J069 Acute upper respiratory infection, unspecified: Secondary | ICD-10-CM | POA: Insufficient documentation

## 2021-05-15 DIAGNOSIS — I4891 Unspecified atrial fibrillation: Secondary | ICD-10-CM | POA: Diagnosis not present

## 2021-05-15 DIAGNOSIS — Z6831 Body mass index (BMI) 31.0-31.9, adult: Secondary | ICD-10-CM | POA: Diagnosis not present

## 2021-05-16 ENCOUNTER — Telehealth: Payer: Self-pay | Admitting: Cardiovascular Disease

## 2021-05-16 DIAGNOSIS — I480 Paroxysmal atrial fibrillation: Secondary | ICD-10-CM

## 2021-05-16 NOTE — Telephone Encounter (Signed)
Spoke to pt and offered her 5/5 appt with C. Kathlen Mody, PA-C however pt does not want to go to [place]. There are no appointments available in the [place] office with any provider. Please advise.  ?

## 2021-05-16 NOTE — Telephone Encounter (Signed)
Pt came into the office stating that she went to her PCP yesterday for cold symptoms and her PCP told her that she was in Afib again.  ? ?She's currently in the lobby.  ?

## 2021-05-16 NOTE — Telephone Encounter (Signed)
Pt states that she started to have an upper respiratory infection on the day after her cardioversion.  She was seen by her PCP on yesterday and told that she was in AFib. Pt denies SOB and chest pain. Pt asked if she can still have cataract surgery on June 5. She is also asking what the next step is ( another cardioversion or medication). Pt states that she has not missed any of her Xarelto. Please advise.  ?

## 2021-05-17 NOTE — Telephone Encounter (Signed)
Pt has appt with B. Strader 5/5 ?

## 2021-05-19 ENCOUNTER — Ambulatory Visit: Payer: Medicare PPO | Admitting: Student

## 2021-05-19 ENCOUNTER — Encounter: Payer: Self-pay | Admitting: Student

## 2021-05-19 VITALS — BP 138/82 | HR 90 | Ht 61.0 in | Wt 162.6 lb

## 2021-05-19 DIAGNOSIS — I4819 Other persistent atrial fibrillation: Secondary | ICD-10-CM | POA: Diagnosis not present

## 2021-05-19 DIAGNOSIS — Z01810 Encounter for preprocedural cardiovascular examination: Secondary | ICD-10-CM

## 2021-05-19 DIAGNOSIS — I10 Essential (primary) hypertension: Secondary | ICD-10-CM

## 2021-05-19 DIAGNOSIS — Z7901 Long term (current) use of anticoagulants: Secondary | ICD-10-CM

## 2021-05-19 DIAGNOSIS — E119 Type 2 diabetes mellitus without complications: Secondary | ICD-10-CM

## 2021-05-19 MED ORDER — HYDROCHLOROTHIAZIDE 25 MG PO TABS: 25.0000 mg | ORAL_TABLET | Freq: Every day | ORAL | 3 refills | Status: DC

## 2021-05-19 MED ORDER — DILTIAZEM HCL ER BEADS 360 MG PO CP24: 360.0000 mg | ORAL_CAPSULE | Freq: Every day | ORAL | 3 refills | Status: DC

## 2021-05-19 NOTE — Patient Instructions (Addendum)
Please purchase a pulse oximeter if able. Heart rate should be between 60 to 100 beats per minute at rest.  ? ? ? ?Medication Instructions:  ?STOP Aspirin ? ? ?Labwork: ?None today ? ?Testing/Procedures: ?None today ? ?Follow-Up: ?Keep June appointment as scheduled ? ? ? ?Any Other Special Instructions Will Be Listed Below (If Applicable). ? ?If you need a refill on your cardiac medications before your next appointment, please call your pharmacy. ? ?

## 2021-05-19 NOTE — Progress Notes (Addendum)
? ?Cardiology Office Note   ? ?Date:  05/20/2021  ? ?ID:  Patricia Sharp, DOB 11/15/1950, MRN [id number] ? ?PCP:  Sharilyn Sites, MD  ?Cardiologist: Jenkins Rouge, MD   ? ?Chief Complaint  ?Patient presents with  ? Follow-up  ?  Atrial Fibrillation  ? ? ?History of Present Illness:   ? ?Patricia Sharp is a 71 y.o. female with past medical history of persistent atrial fibrillation, HTN, Type II DM and arthritis who presents to the office today for follow-up of her atrial fibrillation. ? ?She was examined by Dr. Johnsie Cancel in 04/2021 for new onset atrial fibrillation. She was asymptomatic with her arrhythmia. She had been on anticoagulation for over 3 weeks and a DCCV was recommended. This was performed by Dr. Radford Pax on 05/09/2021 and she converted to normal sinus rhythm with 150 J shock. ? ?She called the office on 05/16/2021 reporting that her PCP told her she was again in atrial fibrillation and was worried this would delay her cataract surgery. Dr. Johnsie Cancel was notified and said she could proceed and recommend an office visit this week and referral to EP for consideration of antiarrhythmic options. ? ?In talking with the patient today, she reports having a cold recently and has been on antibiotics and decongestants for this.  She was evaluated by her PCP and informed she was back in atrial fibrillation at that time. She has been asymptomatic with her atrial fibrillation and denies any associated chest pain or palpitations. Did have intermittent palpitations in the past. No recent orthopnea, PND or pitting edema. She reports good compliance with Xarelto with no reports of active bleeding and she has not missed any doses. She is anxious about the possible next steps in regard to her atrial fibrillation and asks appropriate questions today about management going forward. ? ?Past Medical History:  ?Diagnosis Date  ? Anemia   ? Anxiety   ? Arthritis   ? Atrial fibrillation ([place]) 03/2021  ? Depression   ? Diabetes mellitus without  complication ([place])   ? Dysrhythmia   ? Essential hypertension, benign 10/29/2017  ? Hypertension   ? ? ?Past Surgical History:  ?Procedure Laterality Date  ? ABDOMINAL HYSTERECTOMY    ? ABDOMINAL SURGERY    ? to remove fibroid tumors  ? BIOPSY  11/25/2017  ? Procedure: BIOPSY;  Surgeon: Rogene Houston, MD;  [facility];  Service: Endoscopy;;  gastric  ? CARDIOVERSION N/A 05/09/2021  ? Procedure: CARDIOVERSION;  Surgeon: Sueanne Margarita, MD;  Location: [facility];  Service: Cardiovascular;  Laterality: N/A;  ? CHOLECYSTECTOMY    ? COLONOSCOPY N/A 01/02/2013  ? Procedure: COLONOSCOPY;  Surgeon: Rogene Houston, MD;  [facility];  Service: Endoscopy;  Laterality: N/A;  825  ? ESOPHAGOGASTRODUODENOSCOPY N/A 11/25/2017  ? Procedure: ESOPHAGOGASTRODUODENOSCOPY (EGD);  Surgeon: Rogene Houston, MD;  [facility];  Service: Endoscopy;  Laterality: N/A;  12:00-moved to 11/11 @ 9:55am per Lelon Frohlich  ? REFRACTIVE SURGERY Bilateral   ? TOTAL KNEE ARTHROPLASTY Left 05/10/2020  ? Procedure: TOTAL KNEE ARTHROPLASTY;  Surgeon: Renette Butters, MD;  [facility]: [facility];  Service: Orthopedics;  Laterality: Left;  ? ? ?Current Medications: ?Outpatient Medications Prior to Visit  ?Medication Sig Dispense Refill  ? ALPRAZolam (XANAX) 0.5 MG tablet Take 0.25-0.5 mg by mouth at bedtime as needed for anxiety or sleep.     ? carboxymethylcellulose (REFRESH PLUS) 0.5 % SOLN Place 1 drop into both eyes daily.    ?  Cholecalciferol (VITAMIN D3) 1000 units CAPS Take 1,000 Units by mouth daily.     ? metFORMIN (GLUCOPHAGE) 500 MG tablet Take 500 mg by mouth daily.    ? olmesartan (BENICAR) 20 MG tablet Take 20 mg by mouth daily.    ? Omega-3 Fatty Acids (FISH OIL) 1000 MG CAPS Take 1,000 mg by mouth daily.    ? potassium chloride (KLOR-CON) 10 MEQ tablet Take 1 tablet (10 mEq total) by mouth daily. 90 tablet 3  ? rivaroxaban (XARELTO) 20 MG TABS tablet Take 1 tablet (20 mg total) by mouth daily with supper. 30 tablet 11   ? aspirin EC 81 MG tablet Take 81 mg by mouth daily. Swallow whole.    ? diltiazem (TIAZAC) 360 MG 24 hr capsule Take 360 mg by mouth daily.    ? hydrochlorothiazide (HYDRODIURIL) 25 MG tablet Take 25 mg by mouth daily.    ? acetaminophen (TYLENOL) 500 MG tablet Take 2 tablets (1,000 mg total) by mouth every 6 (six) hours as needed for mild pain or moderate pain. (Patient not taking: Reported on 05/02/2021) 60 tablet 0  ? ?No facility-administered medications prior to visit.  ?  ? ?Allergies:   Azithromycin and Cefuroxime axetil  ? ?Social History  ? ?Socioeconomic History  ? Marital status: Widowed  ?  Spouse name: Not on file  ? Number of children: Not on file  ? Years of education: Not on file  ? Highest education level: Not on file  ?Occupational History  ? Not on file  ?Tobacco Use  ? Smoking status: Never  ? Smokeless tobacco: Never  ?Vaping Use  ? Vaping Use: Never used  ?Substance and Sexual Activity  ? Alcohol use: No  ? Drug use: No  ? Sexual activity: Not on file  ?Other Topics Concern  ? Not on file  ?Social History Narrative  ? Not on file  ? ?Social Determinants of Health  ? ?Financial Resource Strain: Not on file  ?Food Insecurity: Not on file  ?Transportation Needs: Not on file  ?Physical Activity: Not on file  ?Stress: Not on file  ?Social Connections: Not on file  ?  ? ?Family History:  The patient's family history includes Atrial fibrillation in her mother.  ? ?Review of Systems:   ? ?Please see the history of present illness.    ? ?All other systems reviewed and are otherwise negative except as noted above. ? ? ?Physical Exam:   ? ?VS:  BP 138/82   Pulse 90   Ht '5\' 1"'$  (1.549 m)   Wt 162 lb 9.6 oz (73.8 kg)   SpO2 97%   BMI 30.72 kg/m?    ?General: Well developed, well nourished,female appearing in no acute distress. ?Head: Normocephalic, atraumatic. ?Neck: No carotid bruits. JVD not elevated.  ?Lungs: Respirations regular and unlabored, without wheezes or rales.  ?Heart: Irregularly  irregular. No S3 or S4.  No murmur, no rubs, or gallops appreciated. ?Abdomen: Appears non-distended. No obvious abdominal masses. ?Msk:  Strength and tone appear normal for age. No obvious joint deformities or effusions. ?Extremities: No clubbing or cyanosis. No pitting edema.  Distal pedal pulses are 2+ bilaterally. ?Neuro: Alert and oriented X 3. Moves all extremities spontaneously. No focal deficits noted. ?Psych:  Responds to questions appropriately with a normal affect. ?Skin: No rashes or lesions noted ? ?Wt Readings from Last 3 Encounters:  ?05/19/21 162 lb 9.6 oz (73.8 kg)  ?05/09/21 156 lb 8.4 oz (71 kg)  ?05/05/21 158 lb (  71.7 kg)  ?  ? ?Studies/Labs Reviewed:  ? ?EKG:  EKG is ordered today.  The ekg ordered today demonstrates rate-controlled atrial fibrillation, HR 90 with no acute ST changes.  ? ?Recent Labs: ?04/28/2021: Hemoglobin 14.2; Platelets 321 ?05/05/2021: BUN 15; Creatinine, Ser 0.92; Potassium 4.1; Sodium 138  ? ?Lipid Panel ?No results found for: CHOL, TRIG, HDL, CHOLHDL, VLDL, LDLCALC, LDLDIRECT ? ?Additional studies/ records that were reviewed today include:  ? ?NST: 12/2017 ?No diagnostic ST segment changes to indicate ischemia. Occasional to frequent PVCs and PACs noted during infusion. ?Small, mild intensity, apical anterior defect that is fixed and consistent with breast attenuation. No definite ischemic defects. ?This is a low risk study. ?Nuclear stress EF: 69%. ?  ?Echo: 04/14/2021 ?IMPRESSIONS  ? ? ? 1. Left ventricular ejection fraction, by estimation, is 60 to 65%. The  ?left ventricle has normal function. The left ventricle has no regional  ?wall motion abnormalities. There is mild left ventricular hypertrophy.  ?Left ventricular diastolic parameters  ?are indeterminate.  ? 2. Right ventricular systolic function is normal. The right ventricular  ?size is normal. Tricuspid regurgitation signal is inadequate for assessing  ?PA pressure.  ? 3. Left atrial size was severely dilated.   ? 4. Right atrial size was mildly dilated.  ? 5. The mitral valve is normal in structure. Trivial mitral valve  ?regurgitation. No evidence of mitral stenosis.  ? 6. The aortic valve is tricuspid. Aortic valve regurgi

## 2021-05-20 ENCOUNTER — Encounter: Payer: Self-pay | Admitting: Student

## 2021-06-07 ENCOUNTER — Telehealth: Payer: Self-pay | Admitting: Pharmacist

## 2021-06-07 ENCOUNTER — Encounter: Payer: Self-pay | Admitting: Internal Medicine

## 2021-06-07 ENCOUNTER — Ambulatory Visit: Payer: Medicare PPO | Admitting: Internal Medicine

## 2021-06-07 VITALS — BP 140/70 | HR 62 | Ht 61.0 in | Wt 162.0 lb

## 2021-06-07 DIAGNOSIS — I4819 Other persistent atrial fibrillation: Secondary | ICD-10-CM

## 2021-06-07 NOTE — Progress Notes (Signed)
HPI Patricia Sharp is referred by Dr. Johnsie Cancel for evaluation of atrial fib. She is a pleasant 71 yo woman with HTN and was found to have atrial fib with a controlled VR. She had an echo demonstrating LAE with a LA size of 54 and preserved LV function. She underwent DCCV and went back to NSR but then had ERAF. She is not sure that she feels much different but at other times says that she does. Denies chest pain or sob.  Allergies  Allergen Reactions   Azithromycin Rash   Cefuroxime Axetil Rash     Current Outpatient Medications  Medication Sig Dispense Refill   acetaminophen (TYLENOL) 500 MG tablet Take 2 tablets (1,000 mg total) by mouth every 6 (six) hours as needed for mild pain or moderate pain. (Patient not taking: Reported on 05/02/2021) 60 tablet 0   ALPRAZolam (XANAX) 0.5 MG tablet Take 0.25-0.5 mg by mouth at bedtime as needed for anxiety or sleep.      carboxymethylcellulose (REFRESH PLUS) 0.5 % SOLN Place 1 drop into both eyes daily.     Cholecalciferol (VITAMIN D3) 1000 units CAPS Take 1,000 Units by mouth daily.      diltiazem (TIAZAC) 360 MG 24 hr capsule Take 1 capsule (360 mg total) by mouth daily. 90 capsule 3   hydrochlorothiazide (HYDRODIURIL) 25 MG tablet Take 1 tablet (25 mg total) by mouth daily. 90 tablet 3   metFORMIN (GLUCOPHAGE) 500 MG tablet Take 500 mg by mouth daily.     olmesartan (BENICAR) 20 MG tablet Take 20 mg by mouth daily.     Omega-3 Fatty Acids (FISH OIL) 1000 MG CAPS Take 1,000 mg by mouth daily.     potassium chloride (KLOR-CON) 10 MEQ tablet Take 1 tablet (10 mEq total) by mouth daily. 90 tablet 3   rivaroxaban (XARELTO) 20 MG TABS tablet Take 1 tablet (20 mg total) by mouth daily with supper. 30 tablet 11   No current facility-administered medications for this visit.     Past Medical History:  Diagnosis Date   Anemia    Anxiety    Arthritis    Atrial fibrillation ([place]) 03/2021   Depression    Diabetes mellitus without complication ([place])     Dysrhythmia    Essential hypertension, benign 10/29/2017   Hypertension     ROS:   All systems reviewed and negative except as noted in the HPI.   Past Surgical History:  Procedure Laterality Date   ABDOMINAL HYSTERECTOMY     ABDOMINAL SURGERY     to remove fibroid tumors   BIOPSY  11/25/2017   Procedure: BIOPSY;  Surgeon: Rogene Houston, MD;  [facility];  Service: Endoscopy;;  gastric   CARDIOVERSION N/A 05/09/2021   Procedure: CARDIOVERSION;  Surgeon: Sueanne Margarita, MD;  Location: [facility];  Service: Cardiovascular;  Laterality: N/A;   CHOLECYSTECTOMY     COLONOSCOPY N/A 01/02/2013   Procedure: COLONOSCOPY;  Surgeon: Rogene Houston, MD;  [facility];  Service: Endoscopy;  Laterality: N/A;  825   ESOPHAGOGASTRODUODENOSCOPY N/A 11/25/2017   Procedure: ESOPHAGOGASTRODUODENOSCOPY (EGD);  Surgeon: Rogene Houston, MD;  [facility];  Service: Endoscopy;  Laterality: N/A;  12:00-moved to 11/11 @ 9:55am per Lelon Frohlich   REFRACTIVE SURGERY Bilateral    TOTAL KNEE ARTHROPLASTY Left 05/10/2020   Procedure: TOTAL KNEE ARTHROPLASTY;  Surgeon: Renette Butters, MD;  [facility];  Service: Orthopedics;  Laterality: Left;  Family History  Problem Relation Age of Onset   Atrial fibrillation Mother    Colon cancer Neg Hx      Social History   Socioeconomic History   Marital status: Widowed    Spouse name: Not on file   Number of children: Not on file   Years of education: Not on file   Highest education level: Not on file  Occupational History   Not on file  Tobacco Use   Smoking status: Never   Smokeless tobacco: Never  Vaping Use   Vaping Use: Never used  Substance and Sexual Activity   Alcohol use: No   Drug use: No   Sexual activity: Not on file  Other Topics Concern   Not on file  Social History Narrative   Not on file   Social Determinants of Health   Financial Resource Strain: Not on file  Food Insecurity: Not on file   Transportation Needs: Not on file  Physical Activity: Not on file  Stress: Not on file  Social Connections: Not on file  Intimate Partner Violence: Not on file     There were no vitals taken for this visit.  Physical Exam:  Well appearing NAD HEENT: Unremarkable Neck:  No JVD, no thyromegally Lymphatics:  No adenopathy Back:  No CVA tenderness Lungs:  Clear HEART:  Regular rate rhythm, no murmurs, no rubs, no clicks Abd:  soft, positive bowel sounds, no organomegally, no rebound, no guarding Ext:  2 plus pulses, no edema, no cyanosis, no clubbing Skin:  No rashes no nodules Neuro:  CN II through XII intact, motor grossly intact  Assess/Plan:  Persistent atrial fib - I have offered her rhythm control with dofetilide as well as rate control. Her marked LAE makes catheter ablation very unlikely to be successful. I offered her dofetilide initiation and she had a very difficult time deciding what to do. I think rate control a better option but she would like to pursue rhythm control and we will schedule her to come to [facility] for initiation of dofetilide. I advised her not to miss any of her meds including xarelto. HTN -her bp is controlled. No change.  Carleene Overlie Markcus Lazenby,MD

## 2021-06-07 NOTE — Patient Instructions (Signed)
Medication Instructions:  Your physician recommends that you continue on your current medications as directed. Please refer to the Current Medication list given to you today.  *If you need a refill on your cardiac medications before your next appointment, please call your pharmacy*   Lab Work: NONE   If you have labs (blood work) drawn today and your tests are completely normal, you will receive your results only by: [place] (if you have MyChart) OR A paper copy in the mail If you have any lab test that is abnormal or we need to change your treatment, we will call you to review the results.   Testing/Procedures: NONE     Follow-Up: At [facility], you and your health needs are our priority.  As part of our continuing mission to provide you with exceptional heart care, we have created designated Provider Care Teams.  These Care Teams include your primary Cardiologist (physician) and Advanced Practice Providers (APPs -  Physician Assistants and Nurse Practitioners) who all work together to provide you with the care you need, when you need it.  We recommend signing up for the patient portal called "MyChart".  Sign up information is provided on this After Visit Summary.  MyChart is used to connect with patients for Virtual Visits (Telemedicine).  Patients are able to view lab/test results, encounter notes, upcoming appointments, etc.  Non-urgent messages can be sent to your provider as well.   To learn more about what you can do with MyChart, go to [email or web address].    Your next appointment:    Fu Dofetilide treatment   The format for your next appointment:   In Person  Provider:   Cristopher Peru, MD    Other Instructions Thank you for choosing [facility]!   We will call you concerning you Dofetilide   Important Information About Sugar

## 2021-06-07 NOTE — Telephone Encounter (Signed)
Medication list reviewed in anticipation of upcoming Tikosyn initiation. Patient is taking one contraindicated medications. Patient is on HCTZ. This will need to be stopped at least 3 days prior to initiation. Patient is not on any QTc prolonging medications.   The need for potassium supplementation should be assessed after blood pressure medication changes are made.  Patient is anticoagulated on Xarelto on the appropriate dose. Please ensure that patient has not missed any anticoagulation doses in the 3 weeks prior to Tikosyn initiation.   Patient will need to be counseled to avoid use of Benadryl while on Tikosyn and in the 2-3 days prior to Tikosyn initiation.

## 2021-06-14 ENCOUNTER — Encounter (HOSPITAL_COMMUNITY): Payer: Self-pay

## 2021-06-16 ENCOUNTER — Encounter: Payer: Self-pay | Admitting: Student

## 2021-06-16 ENCOUNTER — Ambulatory Visit: Payer: Medicare PPO | Admitting: Student

## 2021-06-16 VITALS — BP 128/72 | HR 75 | Ht 61.0 in | Wt 164.0 lb

## 2021-06-16 DIAGNOSIS — E119 Type 2 diabetes mellitus without complications: Secondary | ICD-10-CM | POA: Diagnosis not present

## 2021-06-16 DIAGNOSIS — Z7901 Long term (current) use of anticoagulants: Secondary | ICD-10-CM | POA: Diagnosis not present

## 2021-06-16 DIAGNOSIS — I4819 Other persistent atrial fibrillation: Secondary | ICD-10-CM

## 2021-06-16 DIAGNOSIS — I10 Essential (primary) hypertension: Secondary | ICD-10-CM | POA: Diagnosis not present

## 2021-06-16 NOTE — Patient Instructions (Addendum)
Check on the cost of Tikosyn and let Rushie Goltz with the [facility] and let her know this information and when you would like to schedule your Tikosyn admission.    Medication Instructions:  Your physician recommends that you continue on your current medications as directed. Please refer to the Current Medication list given to you today.   Labwork: None today  Testing/Procedures: None today  Follow-Up: Keep 12/12/21 with Dr.Taylor   Any Other Special Instructions Will Be Listed Below (If Applicable).  If you need a refill on your cardiac medications before your next appointment, please call your pharmacy.

## 2021-06-16 NOTE — Progress Notes (Unsigned)
Cardiology Office Note    Date:  06/17/2021   ID:  Patricia, Sharp Jul 26, 1950, MRN [id number]  PCP:  Sharilyn Sites, MD  Cardiologist: Jenkins Rouge, MD    Chief Complaint  Patient presents with   Follow-up    1 month visit    History of Present Illness:    Patricia Sharp is a 71 y.o. female with past medical history of persistent atrial fibrillation, HTN, Type II DM and arthritis who presents to the office today for 1 month follow-up.   She was last examined by myself in 05/2021 as she had recently undergone a cardioversion in 04/2021 but had recurrent atrial fibrillation approximately week after her procedure. At the time of follow-up, she reported overall being asymptomatic with this and denied any associated chest pain, palpitations or dyspnea. Given her well-controlled rates, she was continued on Cardizem CD 360 mg daily along with Xarelto 20 mg daily and referred to EP for consideration of antiarrhythmic options. She did meet with Dr. Lovena Le on 06/07/2021 was not felt to be a candidate for catheter ablation given her left atrial enlargement but the option of Tikosyn was reviewed.   In talking with the patient today, she reports that she does feel occasional palpitations and these are typically most notable at nighttime. Denies any specific exertional chest pain or dyspnea on exertion. No recent orthopnea, PND or pitting edema. She is planning to possibly undergo Tikosyn initiation in 07/2021 but wishes to hold off until then given upcoming cataract surgery. She remains on Xarelto for anticoagulation with no reports of active bleeding.  Past Medical History:  Diagnosis Date   Anemia    Anxiety    Arthritis    Atrial fibrillation ([place]) 03/2021   Depression    Diabetes mellitus without complication ([place])    Dysrhythmia    Essential hypertension, benign 10/29/2017   Hypertension     Past Surgical History:  Procedure Laterality Date   ABDOMINAL HYSTERECTOMY     ABDOMINAL  SURGERY     to remove fibroid tumors   BIOPSY  11/25/2017   Procedure: BIOPSY;  Surgeon: Rogene Houston, MD;  [facility];  Service: Endoscopy;;  gastric   CARDIOVERSION N/A 05/09/2021   Procedure: CARDIOVERSION;  Surgeon: Sueanne Margarita, MD;  Location: [facility];  Service: Cardiovascular;  Laterality: N/A;   CHOLECYSTECTOMY     COLONOSCOPY N/A 01/02/2013   Procedure: COLONOSCOPY;  Surgeon: Rogene Houston, MD;  [facility];  Service: Endoscopy;  Laterality: N/A;  825   ESOPHAGOGASTRODUODENOSCOPY N/A 11/25/2017   Procedure: ESOPHAGOGASTRODUODENOSCOPY (EGD);  Surgeon: Rogene Houston, MD;  [facility];  Service: Endoscopy;  Laterality: N/A;  12:00-moved to 11/11 @ 9:55am per Lelon Frohlich   REFRACTIVE SURGERY Bilateral    TOTAL KNEE ARTHROPLASTY Left 05/10/2020   Procedure: TOTAL KNEE ARTHROPLASTY;  Surgeon: Renette Butters, MD;  [facility]: [facility];  Service: Orthopedics;  Laterality: Left;    Current Medications: Outpatient Medications Prior to Visit  Medication Sig Dispense Refill   acetaminophen (TYLENOL) 500 MG tablet Take 2 tablets (1,000 mg total) by mouth every 6 (six) hours as needed for mild pain or moderate pain. 60 tablet 0   ALPRAZolam (XANAX) 0.5 MG tablet Take 0.25-0.5 mg by mouth at bedtime as needed for anxiety or sleep.      carboxymethylcellulose (REFRESH PLUS) 0.5 % SOLN Place 1 drop into both eyes daily.     Cholecalciferol (VITAMIN D3) 1000 units CAPS  Take 1,000 Units by mouth daily.      diltiazem (TIAZAC) 360 MG 24 hr capsule Take 1 capsule (360 mg total) by mouth daily. 90 capsule 3   hydrochlorothiazide (HYDRODIURIL) 25 MG tablet Take 1 tablet (25 mg total) by mouth daily. 90 tablet 3   metFORMIN (GLUCOPHAGE) 500 MG tablet Take 500 mg by mouth daily.     olmesartan (BENICAR) 20 MG tablet Take 20 mg by mouth daily.     Omega-3 Fatty Acids (FISH OIL) 1000 MG CAPS Take 1,000 mg by mouth daily.     potassium chloride (KLOR-CON) 10 MEQ tablet  Take 1 tablet (10 mEq total) by mouth daily. 90 tablet 3   rivaroxaban (XARELTO) 20 MG TABS tablet Take 1 tablet (20 mg total) by mouth daily with supper. 30 tablet 11   No facility-administered medications prior to visit.     Allergies:   Azithromycin and Cefuroxime axetil   Social History   Socioeconomic History   Marital status: Widowed    Spouse name: Not on file   Number of children: Not on file   Years of education: Not on file   Highest education level: Not on file  Occupational History   Not on file  Tobacco Use   Smoking status: Never   Smokeless tobacco: Never  Vaping Use   Vaping Use: Never used  Substance and Sexual Activity   Alcohol use: No   Drug use: No   Sexual activity: Not on file  Other Topics Concern   Not on file  Social History Narrative   Not on file   Social Determinants of Health   Financial Resource Strain: Not on file  Food Insecurity: Not on file  Transportation Needs: Not on file  Physical Activity: Not on file  Stress: Not on file  Social Connections: Not on file     Family History:  The patient's family history includes Atrial fibrillation in her mother.   Review of Systems:    Please see the history of present illness.     All other systems reviewed and are otherwise negative except as noted above.   Physical Exam:    VS:  BP 128/72   Pulse 75   Ht '5\' 1"'$  (1.549 m)   Wt 164 lb (74.4 kg)   SpO2 96%   BMI 30.99 kg/m    General: Well developed, well nourished,female appearing in no acute distress. Head: Normocephalic, atraumatic. Neck: No carotid bruits. JVD not elevated.  Lungs: Respirations regular and unlabored, without wheezes or rales.  Heart: Irregularly irregular. No S3 or S4.  No murmur, no rubs, or gallops appreciated. Abdomen: Appears non-distended. No obvious abdominal masses. Msk:  Strength and tone appear normal for age. No obvious joint deformities or effusions. Extremities: No clubbing or cyanosis. No  pitting edema.  Distal pedal pulses are 2+ bilaterally. Neuro: Alert and oriented X 3. Moves all extremities spontaneously. No focal deficits noted. Psych:  Responds to questions appropriately with a normal affect. Skin: No rashes or lesions noted  Wt Readings from Last 3 Encounters:  06/16/21 164 lb (74.4 kg)  06/07/21 162 lb (73.5 kg)  05/19/21 162 lb 9.6 oz (73.8 kg)     Studies/Labs Reviewed:   EKG:  EKG is ordered today.  The ekg ordered today demonstrates rate-controlled atrial fibrillation, HR 75 with isolated PVC and no acute ST changes.   Recent Labs: 04/28/2021: Hemoglobin 14.2; Platelets 321 05/05/2021: BUN 15; Creatinine, Ser 0.92; Potassium 4.1; Sodium 138  Lipid Panel No results found for: CHOL, TRIG, HDL, CHOLHDL, VLDL, LDLCALC, LDLDIRECT  Additional studies/ records that were reviewed today include:   Echocardiogram: 04/14/2021 IMPRESSIONS     1. Left ventricular ejection fraction, by estimation, is 60 to 65%. The  left ventricle has normal function. The left ventricle has no regional  wall motion abnormalities. There is mild left ventricular hypertrophy.  Left ventricular diastolic parameters  are indeterminate.   2. Right ventricular systolic function is normal. The right ventricular  size is normal. Tricuspid regurgitation signal is inadequate for assessing  PA pressure.   3. Left atrial size was severely dilated.   4. Right atrial size was mildly dilated.   5. The mitral valve is normal in structure. Trivial mitral valve  regurgitation. No evidence of mitral stenosis.   6. The aortic valve is tricuspid. Aortic valve regurgitation is mild. No  aortic stenosis is present.   7. The inferior vena cava is normal in size with greater than 50%  respiratory variability, suggesting right atrial pressure of 3 mmHg.   Assessment:    1. Persistent atrial fibrillation ([place])   2. Current use of long term anticoagulation   3. Essential hypertension   4. Type 2  diabetes mellitus without complication, without long-term current use of insulin ([place])      Plan:   In order of problems listed above:  1. Persistent Atrial Fibrillation/Use of Long-term Anticoagulation - She has started to notice some palpitations which have typically been most notable at night. She previously failed DCCV given recurrence within 1 week and was evaluated by EP but not felt to be a candidate for ablation given her severe LA dilatation. She is planning to possibly undergo Tikosyn initiation. We reviewed that she should check the cost of the medication and follow-up with the [facility] in regards to scheduling this. Her rates are well controlled in the 60's to 70's during today's visit, therefore will continue Cardizem CD 360 mg daily. - No reports of active bleeding. She remains on Xarelto 20 mg daily for anticoagulation.  2. HTN - Her blood pressure is well-controlled at 128/72 during today's visit. Continue current medical therapy with Cardizem CD 360 mg daily, HCTZ 25 mg daily and Olmesartan 20 mg daily.  3. Type 2 DM - Followed by her PCP. Remains on Metformin '500mg'$  daily.   Medication Adjustments/Labs and Tests Ordered: Current medicines are reviewed at length with the patient today.  Concerns regarding medicines are outlined above.  Medication changes, Labs and Tests ordered today are listed in the Patient Instructions below. Patient Instructions  Check on the cost of Tikosyn and let Rushie Goltz with the [facility] and let her know this information and when you would like to schedule your Tikosyn admission.    Medication Instructions:  Your physician recommends that you continue on your current medications as directed. Please refer to the Current Medication list given to you today.   Labwork: None today  Testing/Procedures: None today  Follow-Up: Keep 12/12/21 with Dr.Taylor   Any Other Special Instructions Will Be Listed  Below (If Applicable).  If you need a refill on your cardiac medications before your next appointment, please call your pharmacy.    Signed, Erma Heritage, PA-C  06/17/2021 11:38 AM    [facility] [address] [place], [place] [address] Phone: [phone] Fax: [phone]

## 2021-06-19 DIAGNOSIS — H409 Unspecified glaucoma: Secondary | ICD-10-CM | POA: Diagnosis not present

## 2021-06-19 DIAGNOSIS — H269 Unspecified cataract: Secondary | ICD-10-CM | POA: Diagnosis not present

## 2021-06-19 DIAGNOSIS — H2512 Age-related nuclear cataract, left eye: Secondary | ICD-10-CM | POA: Diagnosis not present

## 2021-06-19 DIAGNOSIS — H401121 Primary open-angle glaucoma, left eye, mild stage: Secondary | ICD-10-CM | POA: Diagnosis not present

## 2021-06-19 DIAGNOSIS — H401131 Primary open-angle glaucoma, bilateral, mild stage: Secondary | ICD-10-CM | POA: Diagnosis not present

## 2021-06-27 NOTE — Telephone Encounter (Signed)
Pt notified to stop HCTZ at least 3 days prior to admission.

## 2021-07-05 DIAGNOSIS — M1712 Unilateral primary osteoarthritis, left knee: Secondary | ICD-10-CM | POA: Diagnosis not present

## 2021-07-31 ENCOUNTER — Other Ambulatory Visit: Payer: Self-pay

## 2021-07-31 ENCOUNTER — Ambulatory Visit (HOSPITAL_COMMUNITY)
Admission: RE | Admit: 2021-07-31 | Discharge: 2021-07-31 | Disposition: A | Discharge status: Home / Self Care | Payer: Medicare PPO | Source: Ambulatory Visit | Attending: Physician Assistant | Admitting: Physician Assistant

## 2021-07-31 ENCOUNTER — Inpatient Hospital Stay (HOSPITAL_COMMUNITY)
Admission: AD | Admit: 2021-07-31 | Discharge: 2021-08-03 | DRG: 309 | Disposition: A | Discharge status: Home / Self Care | Payer: Medicare PPO | Source: Ambulatory Visit | Attending: Internal Medicine | Admitting: Internal Medicine

## 2021-07-31 ENCOUNTER — Encounter (HOSPITAL_COMMUNITY): Payer: Self-pay | Admitting: Internal Medicine

## 2021-07-31 VITALS — BP 176/84 | HR 68 | Ht 61.0 in | Wt 166.8 lb

## 2021-07-31 DIAGNOSIS — I4819 Other persistent atrial fibrillation: Secondary | ICD-10-CM | POA: Insufficient documentation

## 2021-07-31 DIAGNOSIS — Z7901 Long term (current) use of anticoagulants: Secondary | ICD-10-CM

## 2021-07-31 DIAGNOSIS — Z6831 Body mass index (BMI) 31.0-31.9, adult: Secondary | ICD-10-CM | POA: Diagnosis not present

## 2021-07-31 DIAGNOSIS — F32A Depression, unspecified: Secondary | ICD-10-CM | POA: Diagnosis present

## 2021-07-31 DIAGNOSIS — Z79899 Other long term (current) drug therapy: Secondary | ICD-10-CM | POA: Diagnosis not present

## 2021-07-31 DIAGNOSIS — E669 Obesity, unspecified: Secondary | ICD-10-CM | POA: Diagnosis not present

## 2021-07-31 DIAGNOSIS — Z90710 Acquired absence of both cervix and uterus: Secondary | ICD-10-CM | POA: Diagnosis not present

## 2021-07-31 DIAGNOSIS — Z8249 Family history of ischemic heart disease and other diseases of the circulatory system: Secondary | ICD-10-CM | POA: Diagnosis not present

## 2021-07-31 DIAGNOSIS — E119 Type 2 diabetes mellitus without complications: Secondary | ICD-10-CM | POA: Diagnosis not present

## 2021-07-31 DIAGNOSIS — D6869 Other thrombophilia: Secondary | ICD-10-CM | POA: Diagnosis present

## 2021-07-31 DIAGNOSIS — Z7984 Long term (current) use of oral hypoglycemic drugs: Secondary | ICD-10-CM | POA: Diagnosis not present

## 2021-07-31 DIAGNOSIS — Z881 Allergy status to other antibiotic agents status: Secondary | ICD-10-CM

## 2021-07-31 DIAGNOSIS — F419 Anxiety disorder, unspecified: Secondary | ICD-10-CM | POA: Diagnosis present

## 2021-07-31 DIAGNOSIS — I10 Essential (primary) hypertension: Secondary | ICD-10-CM | POA: Diagnosis not present

## 2021-07-31 DIAGNOSIS — Z96652 Presence of left artificial knee joint: Secondary | ICD-10-CM | POA: Diagnosis not present

## 2021-07-31 LAB — BASIC METABOLIC PANEL
Anion gap: 11 (ref 5–15)
BUN: 15 mg/dL (ref 8–23)
CO2: 27 mmol/L (ref 22–32)
Calcium: 9.9 mg/dL (ref 8.9–10.3)
Chloride: 98 mmol/L (ref 98–111)
Creatinine, Ser: 0.87 mg/dL (ref 0.44–1.00)
GFR, Estimated: >60 mL/min (ref >60)
Glucose, Bld: 100 mg/dL — ABNORMAL HIGH (ref 70–99)
Potassium: 4.3 mmol/L (ref 3.5–5.1)
Sodium: 136 mmol/L (ref 135–145)

## 2021-07-31 LAB — MAGNESIUM: Magnesium: 1.9 mg/dL (ref 1.7–2.4)

## 2021-07-31 MED ORDER — METFORMIN HCL 500 MG PO TABS
500.0000 mg | ORAL_TABLET | Freq: Every day | ORAL | Status: DC
  Administered 2021-08-01 – 2021-08-03 (×3): 500 mg via ORAL
  Filled 2021-07-31 (×3): qty 1

## 2021-07-31 MED ORDER — IRBESARTAN 150 MG PO TABS
150.0000 mg | ORAL_TABLET | Freq: Every day | ORAL | Status: DC
  Administered 2021-08-01 – 2021-08-03 (×3): 150 mg via ORAL
  Filled 2021-07-31 (×3): qty 1

## 2021-07-31 MED ORDER — CARBOXYMETHYLCELLULOSE SODIUM 0.5 % OP SOLN: 1.0000 [drp] | Freq: Every day | OPHTHALMIC | Status: DC

## 2021-07-31 MED ORDER — POTASSIUM CHLORIDE ER 10 MEQ PO TBCR
10.0000 meq | EXTENDED_RELEASE_TABLET | Freq: Every day | ORAL | Status: DC
  Administered 2021-08-01 – 2021-08-03 (×3): 10 meq via ORAL
  Filled 2021-07-31 (×6): qty 1

## 2021-07-31 MED ORDER — SODIUM CHLORIDE 0.9 % IV SOLN: 250.0000 mL | INTRAVENOUS | Status: DC | PRN

## 2021-07-31 MED ORDER — POLYVINYL ALCOHOL 1.4 % OP SOLN
1.0000 [drp] | Freq: Every day | OPHTHALMIC | Status: DC
  Filled 2021-07-31: qty 15

## 2021-07-31 MED ORDER — ALPRAZOLAM 0.25 MG PO TABS
0.2500 mg | ORAL_TABLET | Freq: Every evening | ORAL | Status: DC | PRN
  Administered 2021-07-31: 0.5 mg via ORAL
  Administered 2021-08-01: 0.25 mg via ORAL
  Administered 2021-08-02: 0.5 mg via ORAL
  Filled 2021-07-31 (×3): qty 2

## 2021-07-31 MED ORDER — SODIUM CHLORIDE 0.9% FLUSH
3.0000 mL | Freq: Two times a day (BID) | INTRAVENOUS | Status: DC
  Administered 2021-07-31 – 2021-08-03 (×7): 3 mL via INTRAVENOUS

## 2021-07-31 MED ORDER — SODIUM CHLORIDE 0.9% FLUSH: 3.0000 mL | INTRAVENOUS | Status: DC | PRN

## 2021-07-31 MED ORDER — DILTIAZEM HCL ER COATED BEADS 120 MG PO CP24
360.0000 mg | ORAL_CAPSULE | Freq: Every day | ORAL | Status: DC
  Administered 2021-08-01 – 2021-08-03 (×3): 360 mg via ORAL
  Filled 2021-07-31 (×3): qty 3

## 2021-07-31 MED ORDER — DOFETILIDE 500 MCG PO CAPS
500.0000 ug | ORAL_CAPSULE | Freq: Two times a day (BID) | ORAL | Status: DC
  Administered 2021-07-31 – 2021-08-03 (×6): 500 ug via ORAL
  Filled 2021-07-31 (×6): qty 1

## 2021-07-31 MED ORDER — ACETAMINOPHEN 500 MG PO TABS: 1000.0000 mg | ORAL_TABLET | Freq: Four times a day (QID) | ORAL | Status: DC | PRN

## 2021-07-31 MED ORDER — RIVAROXABAN 20 MG PO TABS
20.0000 mg | ORAL_TABLET | Freq: Every day | ORAL | Status: DC
  Administered 2021-07-31 – 2021-08-02 (×3): 20 mg via ORAL
  Filled 2021-07-31 (×3): qty 1

## 2021-07-31 MED ORDER — MAGNESIUM SULFATE 2 GM/50ML IV SOLN
2.0000 g | Freq: Once | INTRAVENOUS | Status: AC
Start: 2021-07-31 — End: 2021-08-01
  Administered 2021-07-31: 2 g via INTRAVENOUS
  Filled 2021-07-31: qty 50

## 2021-07-31 NOTE — Progress Notes (Signed)
Primary Care Physician: Sharilyn Sites, MD Primary Cardiologist: Dr Johnsie Cancel Primary Electrophysiologist: Dr Lovena Le Referring Physician: Dr Bettye Boeck is a 71 y.o. female with a history of HTN, DM, atrial fibrillation who presents for follow up in the [facility]. Patient is on Xarelto for a CHADS2VASC score of 4. She underwent DCCV 05/09/21 but had early return of afib. She was seen by Dr Lovena Le who recommended dofetilide for rhythm control.   On follow up today, patient presents for dofetilide admission. She denies any missed doses of anticoagulation in the past 3 weeks. She did stop her HCTZ 3 days ago.   Today, she denies symptoms of palpitations, chest pain, shortness of breath, orthopnea, PND, lower extremity edema, dizziness, presyncope, syncope, snoring, daytime somnolence, bleeding, or neurologic sequela. The patient is tolerating medications without difficulties and is otherwise without complaint today.    Atrial Fibrillation Risk Factors:  she does not have symptoms or diagnosis of sleep apnea. she does not have a history of rheumatic fever.   she has a BMI of Body mass index is 31.52 kg/m.Marland Kitchen Filed Weights   07/31/21 1041  Weight: 75.7 kg    Family History  Problem Relation Age of Onset   Atrial fibrillation Mother    Colon cancer Neg Hx      Atrial Fibrillation Management history:  Previous antiarrhythmic drugs: none Previous cardioversions: 05/09/21 Previous ablations: none CHADS2VASC score: 4 Anticoagulation history: Xarelto   Past Medical History:  Diagnosis Date   Anemia    Anxiety    Arthritis    Atrial fibrillation ([place]) 03/2021   Depression    Diabetes mellitus without complication ([place])    Dysrhythmia    Essential hypertension, benign 10/29/2017   Hypertension    Past Surgical History:  Procedure Laterality Date   ABDOMINAL HYSTERECTOMY     ABDOMINAL SURGERY     to remove fibroid tumors   BIOPSY   11/25/2017   Procedure: BIOPSY;  Surgeon: Rogene Houston, MD;  [facility];  Service: Endoscopy;;  gastric   CARDIOVERSION N/A 05/09/2021   Procedure: CARDIOVERSION;  Surgeon: Sueanne Margarita, MD;  [facility];  Service: Cardiovascular;  Laterality: N/A;   CHOLECYSTECTOMY     COLONOSCOPY N/A 01/02/2013   Procedure: COLONOSCOPY;  Surgeon: Rogene Houston, MD;  [facility];  Service: Endoscopy;  Laterality: N/A;  825   ESOPHAGOGASTRODUODENOSCOPY N/A 11/25/2017   Procedure: ESOPHAGOGASTRODUODENOSCOPY (EGD);  Surgeon: Rogene Houston, MD;  [facility];  Service: Endoscopy;  Laterality: N/A;  12:00-moved to 11/11 @ 9:55am per Lelon Frohlich   REFRACTIVE SURGERY Bilateral    TOTAL KNEE ARTHROPLASTY Left 05/10/2020   Procedure: TOTAL KNEE ARTHROPLASTY;  Surgeon: Renette Butters, MD;  [facility];  Service: Orthopedics;  Laterality: Left;    Current Outpatient Medications  Medication Sig Dispense Refill   acetaminophen (TYLENOL) 500 MG tablet Take 2 tablets (1,000 mg total) by mouth every 6 (six) hours as needed for mild pain or moderate pain. (Patient taking differently: Take 1,000 mg by mouth as needed for mild pain or moderate pain.) 60 tablet 0   ALPRAZolam (XANAX) 0.5 MG tablet Take 0.25-0.5 mg by mouth at bedtime as needed for anxiety or sleep.      carboxymethylcellulose (REFRESH PLUS) 0.5 % SOLN Place 1 drop into both eyes daily.     Cholecalciferol (VITAMIN D3) 1000 units CAPS Take 1,000 Units by mouth daily.  diltiazem (TIAZAC) 360 MG 24 hr capsule Take 1 capsule (360 mg total) by mouth daily. 90 capsule 3   metFORMIN (GLUCOPHAGE) 500 MG tablet Take 500 mg by mouth daily.     olmesartan (BENICAR) 20 MG tablet Take 20 mg by mouth daily.     Omega-3 Fatty Acids (FISH OIL) 1000 MG CAPS Take 1,000 mg by mouth daily.     potassium chloride (KLOR-CON) 10 MEQ tablet Take 1 tablet (10 mEq total) by mouth daily. 90 tablet 3   rivaroxaban (XARELTO) 20 MG  TABS tablet Take 1 tablet (20 mg total) by mouth daily with supper. 30 tablet 11   No current facility-administered medications for this encounter.    Allergies  Allergen Reactions   Azithromycin Rash   Cefuroxime Axetil Rash    Social History   Socioeconomic History   Marital status: Widowed    Spouse name: Not on file   Number of children: Not on file   Years of education: Not on file   Highest education level: Not on file  Occupational History   Not on file  Tobacco Use   Smoking status: Never   Smokeless tobacco: Never  Vaping Use   Vaping Use: Never used  Substance and Sexual Activity   Alcohol use: No   Drug use: No   Sexual activity: Not on file  Other Topics Concern   Not on file  Social History Narrative   Not on file   Social Determinants of Health   Financial Resource Strain: Not on file  Food Insecurity: Not on file  Transportation Needs: Not on file  Physical Activity: Not on file  Stress: Not on file  Social Connections: Not on file  Intimate Partner Violence: Not on file     ROS- All systems are reviewed and negative except as per the HPI above.  Physical Exam: Vitals:   07/31/21 1041  BP: (!) 176/84  Pulse: 68  Weight: 75.7 kg  Height: '5\' 1"'$  (1.549 m)    GEN- The patient is a well appearing obese female, alert and oriented x 3 today.   Head- normocephalic, atraumatic Eyes-  Sclera clear, conjunctiva pink Ears- hearing intact Oropharynx- clear Neck- supple  Lungs- Clear to ausculation bilaterally, normal work of breathing Heart- irregular rate and rhythm, no murmurs, rubs or gallops  GI- soft, NT, ND, + BS Extremities- no clubbing, cyanosis, or edema MS- no significant deformity or atrophy Skin- no rash or lesion Psych- euthymic mood, full affect Neuro- strength and sensation are intact  Wt Readings from Last 3 Encounters:  07/31/21 75.7 kg  06/16/21 74.4 kg  06/07/21 73.5 kg    EKG today demonstrates  Afib Vent. rate 68  BPM PR interval * ms QRS duration 72 ms QT/QTcB 384/408 ms  Echo 04/14/21 demonstrated   1. Left ventricular ejection fraction, by estimation, is 60 to 65%. The  left ventricle has normal function. The left ventricle has no regional  wall motion abnormalities. There is mild left ventricular hypertrophy.  Left ventricular diastolic parameters are indeterminate.   2. Right ventricular systolic function is normal. The right ventricular  size is normal. Tricuspid regurgitation signal is inadequate for assessing PA pressure.   3. Left atrial size was severely dilated.   4. Right atrial size was mildly dilated.   5. The mitral valve is normal in structure. Trivial mitral valve  regurgitation. No evidence of mitral stenosis.   6. The aortic valve is tricuspid. Aortic valve regurgitation is mild.  No  aortic stenosis is present.   7. The inferior vena cava is normal in size with greater than 50%  respiratory variability, suggesting right atrial pressure of 3 mmHg.   Epic records are reviewed at length today  CHA2DS2-VASc Score = 4  The patient's score is based upon: CHF History: 0 HTN History: 1 Diabetes History: 1 Stroke History: 0 Vascular Disease History: 0 Age Score: 1 Gender Score: 1       ASSESSMENT AND PLAN: 1. Persistent Atrial Fibrillation (ICD10:  I48.19) The patient's CHA2DS2-VASc score is 4, indicating a 4.8% annual risk of stroke.   Patient remains in rate controlled afib. Patient presents for dofetilide admission. Continue Xarelto 20 mg daily, states no missed doses in the last 3 weeks. No recent benadryl use PharmD has screened medications, she has discontinued HCTZ 3 days ago.  Labs today show creatinine at 0.87, K+ 4.3 and mag 1.9, CrCl calculated at 71 mL/min  2. Secondary Hypercoagulable State (ICD10:  D68.69) The patient is at significant risk for stroke/thromboembolism based upon her CHA2DS2-VASc Score of 4.  Continue Rivaroxaban (Xarelto).   3.  Obesity Body mass index is 31.52 kg/m. Lifestyle modification was discussed at length including regular exercise and weight reduction.  4. HTN Elevate today, will reevaluate once she is back in SR.   To be admitted later today once a bed becomes available.    [facility] [address] [place], [place] [address] [phone] 07/31/2021 11:13 AM

## 2021-07-31 NOTE — Care Management (Signed)
  Transition of Care [facility]) Screening Note   Patient Details  Name: Patricia Sharp Date of Birth: 01-15-1951   Transition of Care [facility]) CM/SW Contact:    Bethena Roys, RN Phone Number: 07/31/2021, 4:43 PM    Transition of Care Department [facility]) has reviewed the patient. Patient presented for Tikosyn Load. Benefits check submitted for cost. Case Manager will follow for cost and pharmacy of choice as the patient progresses.

## 2021-07-31 NOTE — Plan of Care (Signed)
  Problem: Education: Goal: Knowledge of General Education information will improve Description: Including pain rating scale, medication(s)/side effects and non-pharmacologic comfort measures Outcome: Progressing   Problem: Health Behavior/Discharge Planning: Goal: Ability to manage health-related needs will improve Outcome: Progressing   Problem: Clinical Measurements: Goal: Cardiovascular complication will be avoided Outcome: Progressing   Problem: Pain Managment: Goal: General experience of comfort will improve Outcome: Progressing   Problem: Cardiac: Goal: Ability to achieve and maintain adequate cardiopulmonary perfusion will improve Outcome: Progressing   Problem: Health Behavior/Discharge Planning: Goal: Ability to safely manage health-related needs after discharge will improve Outcome: Progressing

## 2021-07-31 NOTE — Progress Notes (Addendum)
Pharmacy: Dofetilide (Tikosyn) - Initial Consult Assessment and Electrolyte Replacement  Pharmacy consulted to assist in monitoring and replacing electrolytes in this 71 y.o. female admitted on 07/31/2021 undergoing dofetilide initiation. First dofetilide dose: 07/31/21  Assessment:  Patient Exclusion Criteria: If any screening criteria checked as "Yes", then  patient  should NOT receive dofetilide until criteria item is corrected.  If "Yes" please indicate correction plan.  YES  NO Patient  Exclusion Criteria Correction Plan   '[]'$   '[x]'$   Baseline QTc interval is greater than or equal to 440 msec. IF above YES box checked dofetilide contraindicated unless patient has ICD; then may proceed if QTc 500-550 msec or with known ventricular conduction abnormalities may proceed with QTc 550-600 msec. QTc =      '[]'$   '[x]'$   Patient is known or suspected to have a digoxin level greater than 2 ng/ml: No results found for: "DIGOXIN"     '[]'$   '[x]'$   Creatinine clearance less than 20 ml/min (calculated using Cockcroft-Gault, actual body weight and serum creatinine): Estimated Creatinine Clearance: 55.2 mL/min (by C-G formula based on SCr of 0.87 mg/dL).     '[]'$   '[x]'$  Patient has received drugs known to prolong the QT intervals within the last 48 hours (phenothiazines, tricyclics or tetracyclic antidepressants, erythromycin, H-1 antihistamines, cisapride, fluoroquinolones, azithromycin, ondansetron).   Updated information on QT prolonging agents is available to be searched on the following database:QT prolonging agents     '[]'$   '[x]'$   Patient received a dose of hydrochlorothiazide ([place]ic) alone or in any combination including triamterene (Dyazide, Maxzide) in the last 48 hours. Pt stopped 3 days ago, last dose Thurs 7/13 pm   '[]'$   '[x]'$  Patient received a medication known to increase dofetilide plasma concentrations prior to initial dofetilide dose:  Trimethoprim (Primsol, Proloprim) in the last 36  hours Verapamil (Calan, Verelan) in the last 36 hours or a sustained release dose in the last 72 hours Megestrol (Megace) in the last 5 days  Cimetidine (Tagamet) in the last 6 hours Ketoconazole (Nizoral) in the last 24 hours Itraconazole (Sporanox) in the last 48 hours  Prochlorperazine (Compazine) in the last 36 hours     '[]'$   '[x]'$   Patient is known to have a history of torsades de pointes; congenital or acquired long QT syndromes.    '[]'$   '[x]'$   Patient has received a Class 1 antiarrhythmic with less than 2 half-lives since last dose. (Disopyramide, Quinidine, Procainamide, Lidocaine, Mexiletine, Flecainide, Propafenone)    '[]'$   '[x]'$   Patient has received amiodarone therapy in the past 3 months or amiodarone level is greater than 0.3 ng/ml.    Patient has been appropriately anticoagulated with Xarelto '20mg'$  daily.  Labs:    Component Value Date/Time   K 4.3 07/31/2021 1047   MG 1.9 07/31/2021 1047     Plan: Potassium: K >/= 4: Appropriate to initiate Tikosyn, no replacement needed    Magnesium: Mg 1.8-2: Give Mg 2 gm IV x1 to prevent Mg from dropping below 1.8 - do not need to recheck Mg. Appropriate to initiate Tikosyn   Thank you for allowing pharmacy to participate in this patient's care   Arrie Senate, PharmD, BCPS, [facility] Clinical Pharmacist [phone] Please check AMION for all [place] numbers 07/31/2021

## 2021-07-31 NOTE — H&P (Signed)
Primary Care Physician: Sharilyn Sites, MD Primary Cardiologist: Dr Johnsie Cancel Primary Electrophysiologist: Dr Lovena Le Referring Physician: Dr Bettye Boeck is a 71 y.o. female with a history of HTN, DM, atrial fibrillation who presents for follow up in the [facility]. Patient is on Xarelto for a CHADS2VASC score of 4. She underwent DCCV 05/09/21 but had early return of afib. She was seen by Dr Lovena Le who recommended dofetilide for rhythm control.    On follow up today, patient presents for dofetilide admission. She denies any missed doses of anticoagulation in the past 3 weeks. She did stop her HCTZ 3 days ago.    Today, she denies symptoms of palpitations, chest pain, shortness of breath, orthopnea, PND, lower extremity edema, dizziness, presyncope, syncope, snoring, daytime somnolence, bleeding, or neurologic sequela. The patient is tolerating medications without difficulties and is otherwise without complaint today.      Atrial Fibrillation Risk Factors:   she does not have symptoms or diagnosis of sleep apnea. she does not have a history of rheumatic fever.     she has a BMI of Body mass index is 31.52 kg/m.Marland Kitchen    Filed Weights    07/31/21 1041  Weight: 75.7 kg           Family History  Problem Relation Age of Onset   Atrial fibrillation Mother     Colon cancer Neg Hx          Atrial Fibrillation Management history:   Previous antiarrhythmic drugs: none Previous cardioversions: 05/09/21 Previous ablations: none CHADS2VASC score: 4 Anticoagulation history: Xarelto         Past Medical History:  Diagnosis Date   Anemia     Anxiety     Arthritis     Atrial fibrillation ([place]) 03/2021   Depression     Diabetes mellitus without complication ([place])     Dysrhythmia     Essential hypertension, benign 10/29/2017   Hypertension           Past Surgical History:  Procedure Laterality Date   ABDOMINAL HYSTERECTOMY       ABDOMINAL  SURGERY        to remove fibroid tumors   BIOPSY   11/25/2017    Procedure: BIOPSY;  Surgeon: Rogene Houston, MD;  Location: [facility];  Service: Endoscopy;;  gastric   CARDIOVERSION N/A 05/09/2021    Procedure: CARDIOVERSION;  Surgeon: Sueanne Margarita, MD;  Location: [facility];  Service: Cardiovascular;  Laterality: N/A;   CHOLECYSTECTOMY       COLONOSCOPY N/A 01/02/2013    Procedure: COLONOSCOPY;  Surgeon: Rogene Houston, MD;  Location: [facility];  Service: Endoscopy;  Laterality: N/A;  825   ESOPHAGOGASTRODUODENOSCOPY N/A 11/25/2017    Procedure: ESOPHAGOGASTRODUODENOSCOPY (EGD);  Surgeon: Rogene Houston, MD;  Location: [facility];  Service: Endoscopy;  Laterality: N/A;  12:00-moved to 11/11 @ 9:55am per Lelon Frohlich   REFRACTIVE SURGERY Bilateral     TOTAL KNEE ARTHROPLASTY Left 05/10/2020    Procedure: TOTAL KNEE ARTHROPLASTY;  Surgeon: Renette Butters, MD;  Location: [facility];  Service: Orthopedics;  Laterality: Left;            Current Outpatient Medications  Medication Sig Dispense Refill   acetaminophen (TYLENOL) 500 MG tablet Take 2 tablets (1,000 mg total) by mouth every 6 (six) hours as needed for mild pain or moderate pain. (Patient taking differently: Take 1,000 mg by  mouth as needed for mild pain or moderate pain.) 60 tablet 0   ALPRAZolam (XANAX) 0.5 MG tablet Take 0.25-0.5 mg by mouth at bedtime as needed for anxiety or sleep.        carboxymethylcellulose (REFRESH PLUS) 0.5 % SOLN Place 1 drop into both eyes daily.       Cholecalciferol (VITAMIN D3) 1000 units CAPS Take 1,000 Units by mouth daily.        diltiazem (TIAZAC) 360 MG 24 hr capsule Take 1 capsule (360 mg total) by mouth daily. 90 capsule 3   metFORMIN (GLUCOPHAGE) 500 MG tablet Take 500 mg by mouth daily.       olmesartan (BENICAR) 20 MG tablet Take 20 mg by mouth daily.       Omega-3 Fatty Acids (FISH OIL) 1000 MG CAPS Take 1,000 mg by mouth daily.       potassium chloride (KLOR-CON) 10 MEQ tablet Take 1  tablet (10 mEq total) by mouth daily. 90 tablet 3   rivaroxaban (XARELTO) 20 MG TABS tablet Take 1 tablet (20 mg total) by mouth daily with supper. 30 tablet 11    No current facility-administered medications for this encounter.          Allergies  Allergen Reactions   Azithromycin Rash   Cefuroxime Axetil Rash      Social History         Socioeconomic History   Marital status: Widowed      Spouse name: Not on file   Number of children: Not on file   Years of education: Not on file   Highest education level: Not on file  Occupational History   Not on file  Tobacco Use   Smoking status: Never   Smokeless tobacco: Never  Vaping Use   Vaping Use: Never used  Substance and Sexual Activity   Alcohol use: No   Drug use: No   Sexual activity: Not on file  Other Topics Concern   Not on file  Social History Narrative   Not on file    Social Determinants of Health    Financial Resource Strain: Not on file  Food Insecurity: Not on file  Transportation Needs: Not on file  Physical Activity: Not on file  Stress: Not on file  Social Connections: Not on file  Intimate Partner Violence: Not on file        ROS- All systems are reviewed and negative except as per the HPI above.   Physical Exam:    Vitals:    07/31/21 1041  BP: (!) 176/84  Pulse: 68  Weight: 75.7 kg  Height: '5\' 1"'$  (1.549 m)      GEN- The patient is a well appearing obese female, alert and oriented x 3 today.   Head- normocephalic, atraumatic Eyes-  Sclera clear, conjunctiva pink Ears- hearing intact Oropharynx- clear Neck- supple  Lungs- Clear to ausculation bilaterally, normal work of breathing Heart- irregular rate and rhythm, no murmurs, rubs or gallops  GI- soft, NT, ND, + BS Extremities- no clubbing, cyanosis, or edema MS- no significant deformity or atrophy Skin- no rash or lesion Psych- euthymic mood, full affect Neuro- strength and sensation are intact      Wt Readings from Last 3  Encounters:  07/31/21 75.7 kg  06/16/21 74.4 kg  06/07/21 73.5 kg      EKG today demonstrates  Afib Vent. rate 68 BPM PR interval * ms QRS duration 72 ms QT/QTcB 384/408 ms   Echo 04/14/21 demonstrated  1. Left ventricular ejection fraction, by estimation, is 60 to 65%. The  left ventricle has normal function. The left ventricle has no regional  wall motion abnormalities. There is mild left ventricular hypertrophy.  Left ventricular diastolic parameters are indeterminate.   2. Right ventricular systolic function is normal. The right ventricular  size is normal. Tricuspid regurgitation signal is inadequate for assessing PA pressure.   3. Left atrial size was severely dilated.   4. Right atrial size was mildly dilated.   5. The mitral valve is normal in structure. Trivial mitral valve  regurgitation. No evidence of mitral stenosis.   6. The aortic valve is tricuspid. Aortic valve regurgitation is mild. No  aortic stenosis is present.   7. The inferior vena cava is normal in size with greater than 50%  respiratory variability, suggesting right atrial pressure of 3 mmHg.    Epic records are reviewed at length today   CHA2DS2-VASc Score = 4  The patient's score is based upon: CHF History: 0 HTN History: 1 Diabetes History: 1 Stroke History: 0 Vascular Disease History: 0 Age Score: 1 Gender Score: 1         ASSESSMENT AND PLAN: 1. Persistent Atrial Fibrillation (ICD10:  I48.19) The patient's CHA2DS2-VASc score is 4, indicating a 4.8% annual risk of stroke.   Patient remains in rate controlled afib. Patient presents for dofetilide admission. Continue Xarelto 20 mg daily, states no missed doses in the last 3 weeks. No recent benadryl use PharmD has screened medications, she has discontinued HCTZ 3 days ago.  Labs today show creatinine at 0.87, K+ 4.3 and mag 1.9, CrCl calculated at 71 mL/min   2. Secondary Hypercoagulable State (ICD10:  D68.69) The patient is at  significant risk for stroke/thromboembolism based upon her CHA2DS2-VASc Score of 4.  Continue Rivaroxaban (Xarelto).    3. Obesity Body mass index is 31.52 kg/m. Lifestyle modification was discussed at length including regular exercise and weight reduction.   4. HTN Elevate today, will reevaluate once she is back in SR.     To be admitted later today once a bed becomes available.      [facility] [address] [place], [place] [address] [phone] 07/31/2021 11:13 AM           EP Attending  Patient seen and examined. Agree with the findings as noted above. The patient presents for initiation of dofetilide. I saw her in the office several weeks ago. She has symptomatic persistent atrial fib with a CVR/RVR. She has not missed her anti-coagulation. She is in atrial fib with a RVR. I have discussed the treatment options with the patient and we will initiate dofetilide. We will plan DCCV in 2 days if she has not reverted back to NSR.  Carleene Overlie Raihan Kimmel,MD

## 2021-08-01 ENCOUNTER — Other Ambulatory Visit (HOSPITAL_COMMUNITY): Payer: Self-pay

## 2021-08-01 ENCOUNTER — Telehealth (HOSPITAL_COMMUNITY): Payer: Self-pay | Admitting: Pharmacy Technician

## 2021-08-01 DIAGNOSIS — I4819 Other persistent atrial fibrillation: Secondary | ICD-10-CM | POA: Diagnosis not present

## 2021-08-01 LAB — MAGNESIUM: Magnesium: 2.2 mg/dL (ref 1.7–2.4)

## 2021-08-01 LAB — BASIC METABOLIC PANEL
Anion gap: 5 (ref 5–15)
BUN: 14 mg/dL (ref 8–23)
CO2: 26 mmol/L (ref 22–32)
Calcium: 8.7 mg/dL — ABNORMAL LOW (ref 8.9–10.3)
Chloride: 108 mmol/L (ref 98–111)
Creatinine, Ser: 0.78 mg/dL (ref 0.44–1.00)
GFR, Estimated: >60 mL/min (ref >60)
Glucose, Bld: 101 mg/dL — ABNORMAL HIGH (ref 70–99)
Potassium: 3.7 mmol/L (ref 3.5–5.1)
Sodium: 139 mmol/L (ref 135–145)

## 2021-08-01 MED ORDER — SODIUM CHLORIDE 0.9 % IV SOLN: INTRAVENOUS | Status: DC

## 2021-08-01 MED ORDER — POTASSIUM CHLORIDE CRYS ER 20 MEQ PO TBCR
40.0000 meq | EXTENDED_RELEASE_TABLET | Freq: Once | ORAL | Status: AC
  Administered 2021-08-01: 40 meq via ORAL
  Filled 2021-08-01: qty 2

## 2021-08-01 NOTE — Progress Notes (Signed)
Pharmacy: Dofetilide (Tikosyn) - Follow Up Assessment and Electrolyte Replacement  Pharmacy consulted to assist in monitoring and replacing electrolytes in this 71 y.o. female admitted on 07/31/2021 undergoing dofetilide initiation. First dofetilide dose: 07/31/21  Labs:    Component Value Date/Time   K 3.7 08/01/2021 0344   MG 2.2 08/01/2021 0344     Plan: Potassium: K 3.5-3.7:  Give KCl 60 mEq po x1  >> 40 mEq ordered per EP  Magnesium: Mg > 2: No additional supplementation needed    Thank you for allowing pharmacy to participate in this patient's care   Arrie Senate, PharmD, BCPS, [place] Pharmacist [phone] Please check AMION for all [place] numbers 08/01/2021

## 2021-08-01 NOTE — Progress Notes (Addendum)
Progress Note  Patient Name: Patricia Sharp Date of Encounter: 08/01/2021  [facility] Cardiologist: Jenkins Rouge, MD   Subjective   Tolerating drug, no complaints  Inpatient Medications    Scheduled Meds:  diltiazem  360 mg Oral Daily   dofetilide  500 mcg Oral BID   irbesartan  150 mg Oral Daily   metFORMIN  500 mg Oral Q breakfast   polyvinyl alcohol  1 drop Both Eyes Daily   potassium chloride  10 mEq Oral Daily   rivaroxaban  20 mg Oral Q supper   sodium chloride flush  3 mL Intravenous Q12H   Continuous Infusions:  sodium chloride     PRN Meds: sodium chloride, acetaminophen, ALPRAZolam, sodium chloride flush   Vital Signs    Vitals:   07/31/21 1414 07/31/21 1422 07/31/21 2035 08/01/21 0614  BP:  (!) 165/105 (!) 159/96 136/88  Pulse:  78 86 96  Resp:  '20 18 19  '$ Temp:   98.4 F (36.9 C) 98.1 F (36.7 C)  TempSrc:   Oral Oral  SpO2:  99%  99%  Weight: 75.7 kg     Height: '5\' 1"'$  (1.549 m)       Intake/Output Summary (Last 24 hours) at 08/01/2021 0859 Last data filed at 07/31/2021 2200 Gross per 24 hour  Intake 243 ml  Output --  Net 243 ml      07/31/2021    2:14 PM 07/31/2021   10:41 AM 06/16/2021    2:15 PM  Last 3 Weights  Weight (lbs) 166 lb 12.8 oz 166 lb 12.8 oz 164 lb  Weight (kg) 75.66 kg 75.66 kg 74.39 kg      Telemetry    AFib/flutter 70's-90's - Personally Reviewed  ECG    AFlutter 94, difficult to assess QT - Personally Reviewed  Physical Exam   GEN: No acute distress.   Neck: No JVD Cardiac: irreg-irreg, no murmurs, rubs, or gallops.  Respiratory: Clear to auscultation bilaterally. GI: Soft, nontender, non-distended  MS: trace if any edema; No deformity. Neuro:  Nonfocal  Psych: Normal affect   Labs    High Sensitivity Troponin:  No results for input(s): "TROPONINIHS" in the last 720 hours.   Chemistry Recent Labs  Lab 07/31/21 1047 08/01/21 0344  NA 136 139  K 4.3 3.7  CL 98 108  CO2 27 26  GLUCOSE 100* 101*   BUN 15 14  CREATININE 0.87 0.78  CALCIUM 9.9 8.7*  MG 1.9 2.2  GFRNONAA >60 >60  ANIONGAP 11 5    Lipids No results for input(s): "CHOL", "TRIG", "HDL", "LABVLDL", "LDLCALC", "CHOLHDL" in the last 168 hours.  HematologyNo results for input(s): "WBC", "RBC", "HGB", "HCT", "MCV", "MCH", "MCHC", "RDW", "PLT" in the last 168 hours. Thyroid No results for input(s): "TSH", "FREET4" in the last 168 hours.  BNPNo results for input(s): "BNP", "PROBNP" in the last 168 hours.  DDimer No results for input(s): "DDIMER" in the last 168 hours.   Radiology    No results found.  Cardiac Studies   04/14/2021 IMPRESSIONS   1. Left ventricular ejection fraction, by estimation, is 60 to 65%. The  left ventricle has normal function. The left ventricle has no regional  wall motion abnormalities. There is mild left ventricular hypertrophy.  Left ventricular diastolic parameters  are indeterminate.   2. Right ventricular systolic function is normal. The right ventricular  size is normal. Tricuspid regurgitation signal is inadequate for assessing  PA pressure.   3. Left atrial  size was severely dilated.   4. Right atrial size was mildly dilated.   5. The mitral valve is normal in structure. Trivial mitral valve  regurgitation. No evidence of mitral stenosis.   6. The aortic valve is tricuspid. Aortic valve regurgitation is mild. No  aortic stenosis is present.   7. The inferior vena cava is normal in size with greater than 50%  respiratory variability, suggesting right atrial pressure of 3 mmHg.   Patient Profile     71 y.o. female HTN, DM, arthritis, Afib admitted for Tikosyn  Assessment & Plan    Persistent AFib CHA2DS2Vasc is 4, on Xarelto Tikosyn load is in progress K+ 3.7 replaced Mag 2.2 Creat 0.78, stable QT difficult to assess, reviewed with Dr. Lovena Le, continue Tikosyn, same dose DCCV tomorrow if needed, pt is aware and agreeable  HTN Home meds  DM Home meds  For questions  or updates, please contact [facility] Please consult [email or web address] for contact info under     Signed, Baldwin Jamaica, PA-C  08/01/2021, 8:59 AM    EP attending  Patient seen and examined.  Agree with the findings as noted above.  The patient has returned back to sinus rhythm after her second dose of dofetilide.  Her QT interval is acceptable.  Her electrolytes are also acceptable as is her kidney function.  She will continue dofetilide initiation.  We will follow her QT interval and electrolytes.  Will discharge in the next 36 hours.  Cristopher Peru, MD

## 2021-08-01 NOTE — Telephone Encounter (Signed)
Pharmacy Patient Advocate Encounter  Insurance verification completed.    The patient is insured through [employer] Part D   The patient is currently admitted and ran test claims for the following: dofetilide (Tikosyn) 500 mcg capsules.  Copays and coinsurance results were relayed to Inpatient clinical team.

## 2021-08-01 NOTE — TOC Benefit Eligibility Note (Signed)
Patient Teacher, English as a foreign language completed.    The patient is currently admitted and upon discharge could be taking dofetilide (Tikosyn) 500 mcg capsules.  The current 30 day co-pay is, $10.00.   The patient is insured through [place], [place] Patient Advocate Specialist [place] Patient Advocate Team Direct Number: [phone]  Fax: [phone]

## 2021-08-01 NOTE — Progress Notes (Signed)
Post Tikosyn Qtc 478. Pt remains in NSR

## 2021-08-02 ENCOUNTER — Encounter (HOSPITAL_COMMUNITY)
Admission: AD | Disposition: A | Discharge status: Home / Self Care | Payer: Self-pay | Source: Ambulatory Visit | Attending: Internal Medicine

## 2021-08-02 DIAGNOSIS — I4819 Other persistent atrial fibrillation: Secondary | ICD-10-CM | POA: Diagnosis not present

## 2021-08-02 LAB — BASIC METABOLIC PANEL
Anion gap: 11 (ref 5–15)
BUN: 14 mg/dL (ref 8–23)
CO2: 26 mmol/L (ref 22–32)
Calcium: 9 mg/dL (ref 8.9–10.3)
Chloride: 103 mmol/L (ref 98–111)
Creatinine, Ser: 0.87 mg/dL (ref 0.44–1.00)
GFR, Estimated: >60 mL/min (ref >60)
Glucose, Bld: 80 mg/dL (ref 70–99)
Potassium: 4.3 mmol/L (ref 3.5–5.1)
Sodium: 140 mmol/L (ref 135–145)

## 2021-08-02 LAB — MAGNESIUM: Magnesium: 2.1 mg/dL (ref 1.7–2.4)

## 2021-08-02 SURGERY — CARDIOVERSION
Anesthesia: General

## 2021-08-02 NOTE — Progress Notes (Signed)
Progress Note  Patient Name: Patricia Sharp Date of Encounter: 08/02/2021  [facility] Cardiologist: Jenkins Rouge, MD   Subjective   Tolerating drug, no complaints or concerns  Inpatient Medications    Scheduled Meds:  diltiazem  360 mg Oral Daily   dofetilide  500 mcg Oral BID   irbesartan  150 mg Oral Daily   metFORMIN  500 mg Oral Q breakfast   polyvinyl alcohol  1 drop Both Eyes Daily   potassium chloride  10 mEq Oral Daily   rivaroxaban  20 mg Oral Q supper   sodium chloride flush  3 mL Intravenous Q12H   Continuous Infusions:  sodium chloride     PRN Meds: sodium chloride, acetaminophen, ALPRAZolam, sodium chloride flush   Vital Signs    Vitals:   08/01/21 1415 08/01/21 1956 08/02/21 0642 08/02/21 0807  BP: (!) 148/65 (!) 154/73 (!) 150/70 (!) 153/66  Pulse: 70 66 71 70  Resp: '20 18 18 19  '$ Temp: 98.2 F (36.8 C) 98 F (36.7 C) 98.1 F (36.7 C) 98.2 F (36.8 C)  TempSrc: Oral Oral Oral Oral  SpO2:  98% 94% 96%  Weight:      Height:       No intake or output data in the 24 hours ending 08/02/21 1013     07/31/2021    2:14 PM 07/31/2021   10:41 AM 06/16/2021    2:15 PM  Last 3 Weights  Weight (lbs) 166 lb 12.8 oz 166 lb 12.8 oz 164 lb  Weight (kg) 75.66 kg 75.66 kg 74.39 kg      Telemetry    SR, a couple brief PATs/PAF - Personally Reviewed  ECG    SR 65bpm, QTc 4104m - Personally Reviewed  Physical Exam   GEN: No acute distress.   Neck: No JVD Cardiac:RRR, no murmurs, rubs, or gallops.  Respiratory: Clear to auscultation bilaterally. GI: Soft, nontender, non-distended  MS: trace if any edema; No deformity. Neuro:  Nonfocal  Psych: Normal affect   Labs    High Sensitivity Troponin:  No results for input(s): "TROPONINIHS" in the last 720 hours.   Chemistry Recent Labs  Lab 07/31/21 1047 08/01/21 0344 08/02/21 0301  NA 136 139 140  K 4.3 3.7 4.3  CL 98 108 103  CO2 '27 26 26  '$ GLUCOSE 100* 101* 80  BUN '15 14 14  '$ CREATININE  0.87 0.78 0.87  CALCIUM 9.9 8.7* 9.0  MG 1.9 2.2 2.1  GFRNONAA >60 >60 >60  ANIONGAP '11 5 11    '$ Lipids No results for input(s): "CHOL", "TRIG", "HDL", "LABVLDL", "LDLCALC", "CHOLHDL" in the last 168 hours.  HematologyNo results for input(s): "WBC", "RBC", "HGB", "HCT", "MCV", "MCH", "MCHC", "RDW", "PLT" in the last 168 hours. Thyroid No results for input(s): "TSH", "FREET4" in the last 168 hours.  BNPNo results for input(s): "BNP", "PROBNP" in the last 168 hours.  DDimer No results for input(s): "DDIMER" in the last 168 hours.   Radiology    No results found.  Cardiac Studies   04/14/2021 IMPRESSIONS   1. Left ventricular ejection fraction, by estimation, is 60 to 65%. The  left ventricle has normal function. The left ventricle has no regional  wall motion abnormalities. There is mild left ventricular hypertrophy.  Left ventricular diastolic parameters  are indeterminate.   2. Right ventricular systolic function is normal. The right ventricular  size is normal. Tricuspid regurgitation signal is inadequate for assessing  PA pressure.   3. Left atrial  size was severely dilated.   4. Right atrial size was mildly dilated.   5. The mitral valve is normal in structure. Trivial mitral valve  regurgitation. No evidence of mitral stenosis.   6. The aortic valve is tricuspid. Aortic valve regurgitation is mild. No  aortic stenosis is present.   7. The inferior vena cava is normal in size with greater than 50%  respiratory variability, suggesting right atrial pressure of 3 mmHg.   Patient Profile     71 y.o. female HTN, DM, arthritis, Afib admitted for Tikosyn  Assessment & Plan    Persistent AFib CHA2DS2Vasc is 4, on Xarelto Tikosyn load is in progress K+ 4.3 Mag 2.1 Creat 0.87, stable QTc stable  Anticipate discharge tomorrow  HTN Home meds  DM Home meds  For questions or updates, please contact [facility] Please consult [email or web address] for contact info under      Signed, Baldwin Jamaica, PA-C  08/02/2021, 10:13 AM

## 2021-08-02 NOTE — Progress Notes (Signed)
Pharmacy: Dofetilide (Tikosyn) - Follow Up Assessment and Electrolyte Replacement  Pharmacy consulted to assist in monitoring and replacing electrolytes in this 71 y.o. female admitted on 07/31/2021 undergoing dofetilide initiation. First dofetilide dose: 07/31/21  Labs:    Component Value Date/Time   K 4.3 08/02/2021 0301   MG 2.1 08/02/2021 0301     Plan: Potassium: K >/= 4: No additional supplementation needed   Magnesium: Mg > 2: No additional supplementation needed    Thank you for allowing pharmacy to participate in this patient's care   Arrie Senate, PharmD, BCPS, [facility] Clinical Pharmacist [phone] Please check AMION for all [place] numbers 08/02/2021

## 2021-08-02 NOTE — Care Management (Signed)
1249 08-02-21 Case Manager spoke with the patient regarding Tikosyn cost. Patient is agreeable to cost and wants the initial Rx to be filled via [facility] and the Rx refills escribed to [facility]. No further needs identified at this time.

## 2021-08-03 ENCOUNTER — Other Ambulatory Visit (HOSPITAL_COMMUNITY): Payer: Self-pay

## 2021-08-03 DIAGNOSIS — I4819 Other persistent atrial fibrillation: Secondary | ICD-10-CM | POA: Diagnosis not present

## 2021-08-03 LAB — BASIC METABOLIC PANEL
Anion gap: 7 (ref 5–15)
BUN: 13 mg/dL (ref 8–23)
CO2: 27 mmol/L (ref 22–32)
Calcium: 8.7 mg/dL — ABNORMAL LOW (ref 8.9–10.3)
Chloride: 106 mmol/L (ref 98–111)
Creatinine, Ser: 0.95 mg/dL (ref 0.44–1.00)
GFR, Estimated: >60 mL/min (ref >60)
Glucose, Bld: 97 mg/dL (ref 70–99)
Potassium: 3.9 mmol/L (ref 3.5–5.1)
Sodium: 140 mmol/L (ref 135–145)

## 2021-08-03 LAB — MAGNESIUM: Magnesium: 2 mg/dL (ref 1.7–2.4)

## 2021-08-03 MED ORDER — MAGNESIUM SULFATE 2 GM/50ML IV SOLN
2.0000 g | Freq: Once | INTRAVENOUS | Status: AC
  Administered 2021-08-03: 2 g via INTRAVENOUS
  Filled 2021-08-03: qty 50

## 2021-08-03 MED ORDER — DOFETILIDE 500 MCG PO CAPS
500.0000 ug | ORAL_CAPSULE | Freq: Two times a day (BID) | ORAL | 5 refills | Status: DC
  Filled 2021-08-03: qty 60, 30d supply, fill #0

## 2021-08-03 MED ORDER — POTASSIUM CHLORIDE CRYS ER 20 MEQ PO TBCR
40.0000 meq | EXTENDED_RELEASE_TABLET | Freq: Once | ORAL | Status: AC
Start: 2021-08-03 — End: 2021-08-03
  Administered 2021-08-03: 40 meq via ORAL
  Filled 2021-08-03: qty 2

## 2021-08-03 NOTE — Progress Notes (Signed)
Pharmacy: Dofetilide (Tikosyn) - Follow Up Assessment and Electrolyte Replacement  Pharmacy consulted to assist in monitoring and replacing electrolytes in this 71 y.o. female admitted on 07/31/2021 undergoing dofetilide initiation. First dofetilide dose: 07/31/21  Labs:    Component Value Date/Time   K 3.9 08/03/2021 0220   MG 2.0 08/03/2021 0220     Plan: Potassium: K 3.8-3.9:  Give KCl 40 mEq po x1    Magnesium: Mg 1.8-2: Give Mg 2 gm IV x1     Thank you for allowing pharmacy to participate in this patient's care   Francena Hanly, PharmD Pharmacy Resident  08/03/2021 8:07 AM

## 2021-08-03 NOTE — Discharge Summary (Signed)
ELECTROPHYSIOLOGY PROCEDURE DISCHARGE SUMMARY    Patient ID: Patricia Sharp,  MRN: [id number], DOB/AGE: Feb 28, 1950 71 y.o.  Admit date: 07/31/2021 Discharge date: 08/03/2021  Primary Care Physician: Sharilyn Sites, MD  Primary Cardiologist: Dr. Johnsie Cancel Electrophysiologist: Dr. Lovena Le  Primary Discharge Diagnosis:  1.  persistent atrial fibrillation status post Tikosyn loading this admission      CHA2DS2Vasc is 4, on Xarelto  Secondary Discharge Diagnosis:  HTN DM  Allergies  Allergen Reactions   Z-Pak [Azithromycin] Rash   Zinacef [Cefuroxime] Rash     Procedures This Admission:  1.  Tikosyn loading  Brief HPI: Patricia Sharp is a 71 y.o. female with a past medical history including above.  She is followed by Dr. Lovena Le for her AFib and recommended dofetilide   Hospital Course:  The patient was admitted and Tikosyn was initiated.  Renal function and electrolytes were followed during the hospitalization.  The patient's QTc remained stable.  She converted with drug and did not require DCCV.  She was monitored until discharge on telemetry which demonstrated AFib > SR.  On the day of discharge, she feels well, was examined by Dr Quentin Ore who considered the patient stable for discharge to home.  Follow-up has been arranged with the AFib clinic in 1 week and EP service in 4 weeks.   Tikosyn teaching was completed She did get potassium here, her admission K+ was 4.3, and now that she is off her HCTZ, do not think that we will need to add/increase her current home dose  Physical Exam: Vitals:   08/02/21 0807 08/02/21 1438 08/03/21 0634 08/03/21 0804  BP: (!) 153/66 140/60 (!) 164/67 (!) 143/62  Pulse: 70 63 73 74  Resp: '19 18 18 18  '$ Temp: 98.2 F (36.8 C) 97.9 F (36.6 C) 98.3 F (36.8 C) 97.8 F (36.6 C)  TempSrc: Oral Oral Oral Oral  SpO2: 96%  95% 94%  Weight:      Height:        GEN- The patient is well appearing, alert and oriented x 3 today.   HEENT:  normocephalic, atraumatic; sclera clear, conjunctiva pink; hearing intact; oropharynx clear; neck supple, no JVP Lymph- no cervical lymphadenopathy Lungs- CTA b/l, normal work of breathing.  No wheezes, rales, rhonchi Heart- RRR, no murmurs, rubs or gallops, PMI not laterally displaced GI- soft, non-tender, non-distended Extremities- no clubbing, cyanosis, or edema MS- no significant deformity or atrophy Skin- warm and dry, no rash or lesion Psych- euthymic mood, full affect Neuro- strength and sensation are intact   Labs:   Lab Results  Component Value Date   WBC 10.5 04/28/2021   HGB 14.2 04/28/2021   HCT 42.1 04/28/2021   MCV 89.4 04/28/2021   PLT 321 04/28/2021    Recent Labs  Lab 08/03/21 0220  NA 140  K 3.9  CL 106  CO2 27  BUN 13  CREATININE 0.95  CALCIUM 8.7*  GLUCOSE 97     Discharge Medications:  Allergies as of 08/03/2021       Reactions   Z-pak [azithromycin] Rash   Zinacef [cefuroxime] Rash        Medication List     STOP taking these medications    hydrochlorothiazide 25 MG tablet Commonly known as: HYDRODIURIL       TAKE these medications    acetaminophen 500 MG tablet Commonly known as: TYLENOL Take 2 tablets (1,000 mg total) by mouth every 6 (six) hours as needed for mild pain or  moderate pain. What changed:  when to take this reasons to take this   ALPRAZolam 0.5 MG tablet Commonly known as: XANAX Take 0.25-0.5 mg by mouth at bedtime as needed for anxiety or sleep.   carboxymethylcellulose 0.5 % Soln Commonly known as: REFRESH PLUS Place 1 drop into both eyes daily.   diltiazem 360 MG 24 hr capsule Commonly known as: TIAZAC Take 1 capsule (360 mg total) by mouth daily. What changed: when to take this   dofetilide 500 MCG capsule Commonly known as: TIKOSYN Take 1 capsule (500 mcg total) by mouth 2 (two) times daily.   Fish Oil 1000 MG Caps Take 1,000 mg by mouth daily.   metFORMIN 500 MG tablet Commonly known as:  GLUCOPHAGE Take 250 mg by mouth 2 (two) times daily.   olmesartan 20 MG tablet Commonly known as: BENICAR Take 20 mg by mouth daily.   potassium chloride 10 MEQ tablet Commonly known as: KLOR-CON Take 1 tablet (10 mEq total) by mouth daily.   rivaroxaban 20 MG Tabs tablet Commonly known as: Xarelto Take 1 tablet (20 mg total) by mouth daily with supper.   trolamine salicylate 10 % cream Commonly known as: ASPERCREME Apply 1 Application topically at bedtime as needed (hand pain).   Vitamin D3 25 MCG (1000 UT) Caps Take 1,000 Units by mouth daily.        Disposition: Home Discharge Instructions     Diet - low sodium heart healthy   Complete by: As directed    Increase activity slowly   Complete by: As directed         Duration of Discharge Encounter: Greater than 30 minutes including physician time.  Venetia Night, PA-C 08/03/2021 12:12 PM

## 2021-08-03 NOTE — Plan of Care (Signed)

## 2021-08-03 NOTE — Care Management Important Message (Signed)
Important Message  Patient Details  Name: SINA SUMPTER MRN: [id number] Date of Birth: 1950/11/13   Medicare Important Message Given:  Yes     Shelda Altes 08/03/2021, 11:02 AM

## 2021-08-09 DIAGNOSIS — I4891 Unspecified atrial fibrillation: Secondary | ICD-10-CM | POA: Diagnosis not present

## 2021-08-09 DIAGNOSIS — E119 Type 2 diabetes mellitus without complications: Secondary | ICD-10-CM | POA: Diagnosis not present

## 2021-08-09 DIAGNOSIS — Z6832 Body mass index (BMI) 32.0-32.9, adult: Secondary | ICD-10-CM | POA: Diagnosis not present

## 2021-08-10 ENCOUNTER — Ambulatory Visit (HOSPITAL_COMMUNITY)
Admit: 2021-08-10 | Discharge: 2021-08-10 | Disposition: A | Discharge status: Home / Self Care | Payer: Medicare PPO | Attending: Physician Assistant | Admitting: Physician Assistant

## 2021-08-10 ENCOUNTER — Encounter (HOSPITAL_COMMUNITY): Payer: Self-pay | Admitting: Physician Assistant

## 2021-08-10 VITALS — BP 148/70 | HR 60 | Ht 61.0 in | Wt 165.0 lb

## 2021-08-10 DIAGNOSIS — I351 Nonrheumatic aortic (valve) insufficiency: Secondary | ICD-10-CM | POA: Insufficient documentation

## 2021-08-10 DIAGNOSIS — D6869 Other thrombophilia: Secondary | ICD-10-CM | POA: Insufficient documentation

## 2021-08-10 DIAGNOSIS — I4819 Other persistent atrial fibrillation: Secondary | ICD-10-CM | POA: Diagnosis not present

## 2021-08-10 DIAGNOSIS — Z6831 Body mass index (BMI) 31.0-31.9, adult: Secondary | ICD-10-CM | POA: Diagnosis not present

## 2021-08-10 DIAGNOSIS — E669 Obesity, unspecified: Secondary | ICD-10-CM | POA: Insufficient documentation

## 2021-08-10 DIAGNOSIS — Z7901 Long term (current) use of anticoagulants: Secondary | ICD-10-CM | POA: Diagnosis not present

## 2021-08-10 DIAGNOSIS — E119 Type 2 diabetes mellitus without complications: Secondary | ICD-10-CM | POA: Diagnosis not present

## 2021-08-10 DIAGNOSIS — I10 Essential (primary) hypertension: Secondary | ICD-10-CM | POA: Insufficient documentation

## 2021-08-10 DIAGNOSIS — Z79899 Other long term (current) drug therapy: Secondary | ICD-10-CM | POA: Insufficient documentation

## 2021-08-10 LAB — BASIC METABOLIC PANEL
Anion gap: 7 (ref 5–15)
BUN: 15 mg/dL (ref 8–23)
CO2: 27 mmol/L (ref 22–32)
Calcium: 9.3 mg/dL (ref 8.9–10.3)
Chloride: 104 mmol/L (ref 98–111)
Creatinine, Ser: 0.85 mg/dL (ref 0.44–1.00)
GFR, Estimated: >60 mL/min (ref >60)
Glucose, Bld: 111 mg/dL — ABNORMAL HIGH (ref 70–99)
Potassium: 4.2 mmol/L (ref 3.5–5.1)
Sodium: 138 mmol/L (ref 135–145)

## 2021-08-10 LAB — MAGNESIUM: Magnesium: 2 mg/dL (ref 1.7–2.4)

## 2021-08-10 MED ORDER — DOFETILIDE 500 MCG PO CAPS: 500.0000 ug | ORAL_CAPSULE | Freq: Two times a day (BID) | ORAL | 1 refills | Status: DC

## 2021-08-10 NOTE — Progress Notes (Signed)
Primary Care Physician: Sharilyn Sites, MD Primary Cardiologist: Dr Johnsie Cancel Primary Electrophysiologist: Dr Lovena Le Referring Physician: Dr Bettye Boeck is a 71 y.o. female with a history of HTN, DM, atrial fibrillation who presents for follow up in the [facility]. Patient is on Xarelto for a CHADS2VASC score of 4. She underwent DCCV 05/09/21 but had early return of afib. She was seen by Dr Lovena Le who recommended dofetilide for rhythm control.   On follow up today, patient is s/p dofetilide admission 7/17-7/20/23. She converted with the medication and did not require DCCV. She reports that she has more energy in SR. No bleeding issues on anticoagulation.   Today, she denies symptoms of palpitations, chest pain, shortness of breath, orthopnea, PND, lower extremity edema, dizziness, presyncope, syncope, snoring, daytime somnolence, bleeding, or neurologic sequela. The patient is tolerating medications without difficulties and is otherwise without complaint today.    Atrial Fibrillation Risk Factors:  she does not have symptoms or diagnosis of sleep apnea. she does not have a history of rheumatic fever.   she has a BMI of Body mass index is 31.18 kg/m.Marland Kitchen Filed Weights   08/10/21 1414  Weight: 74.8 kg    Family History  Problem Relation Age of Onset   Atrial fibrillation Mother    Colon cancer Neg Hx      Atrial Fibrillation Management history:  Previous antiarrhythmic drugs: dofetilide  Previous cardioversions: 05/09/21 Previous ablations: none CHADS2VASC score: 4 Anticoagulation history: Xarelto   Past Medical History:  Diagnosis Date   Anemia    Anxiety    Arthritis    Atrial fibrillation ([place]) 03/2021   Depression    Diabetes mellitus without complication ([place])    Dysrhythmia    Essential hypertension, benign 10/29/2017   Hypertension    Past Surgical History:  Procedure Laterality Date   ABDOMINAL HYSTERECTOMY      ABDOMINAL SURGERY     to remove fibroid tumors   BIOPSY  11/25/2017   Procedure: BIOPSY;  Surgeon: Rogene Houston, MD;  [facility];  Service: Endoscopy;;  gastric   CARDIOVERSION N/A 05/09/2021   Procedure: CARDIOVERSION;  Surgeon: Sueanne Margarita, MD;  Location: [facility];  Service: Cardiovascular;  Laterality: N/A;   CHOLECYSTECTOMY     COLONOSCOPY N/A 01/02/2013   Procedure: COLONOSCOPY;  Surgeon: Rogene Houston, MD;  [facility];  Service: Endoscopy;  Laterality: N/A;  825   ESOPHAGOGASTRODUODENOSCOPY N/A 11/25/2017   Procedure: ESOPHAGOGASTRODUODENOSCOPY (EGD);  Surgeon: Rogene Houston, MD;  [facility];  Service: Endoscopy;  Laterality: N/A;  12:00-moved to 11/11 @ 9:55am per Lelon Frohlich   REFRACTIVE SURGERY Bilateral    TOTAL KNEE ARTHROPLASTY Left 05/10/2020   Procedure: TOTAL KNEE ARTHROPLASTY;  Surgeon: Renette Butters, MD;  Location: [facility];  Service: Orthopedics;  Laterality: Left;    Current Outpatient Medications  Medication Sig Dispense Refill   acetaminophen (TYLENOL) 500 MG tablet Take 2 tablets (1,000 mg total) by mouth every 6 (six) hours as needed for mild pain or moderate pain. (Patient taking differently: Take 1,000 mg by mouth 2 (two) times daily as needed for mild pain.) 60 tablet 0   ALPRAZolam (XANAX) 0.5 MG tablet Take 0.25-0.5 mg by mouth at bedtime as needed for anxiety or sleep.      carboxymethylcellulose (REFRESH PLUS) 0.5 % SOLN Place 1 drop into both eyes daily.     Cholecalciferol (VITAMIN D3) 1000 units CAPS Take 1,000  Units by mouth daily.      diltiazem (TIAZAC) 360 MG 24 hr capsule Take 1 capsule (360 mg total) by mouth daily. 90 capsule 3   dofetilide (TIKOSYN) 500 MCG capsule Take 1 capsule (500 mcg total) by mouth 2 (two) times daily. 60 capsule 5   metFORMIN (GLUCOPHAGE) 500 MG tablet Take 250 mg by mouth 2 (two) times daily.     olmesartan (BENICAR) 20 MG tablet Take 20 mg by mouth daily.     Omega-3 Fatty Acids  (FISH OIL) 1000 MG CAPS Take 1,000 mg by mouth daily.     potassium chloride (KLOR-CON) 10 MEQ tablet Take 1 tablet (10 mEq total) by mouth daily. 90 tablet 3   rivaroxaban (XARELTO) 20 MG TABS tablet Take 1 tablet (20 mg total) by mouth daily with supper. 30 tablet 11   trolamine salicylate (ASPERCREME) 10 % cream Apply 1 Application topically at bedtime as needed (hand pain).     No current facility-administered medications for this encounter.    Allergies  Allergen Reactions   Z-Pak [Azithromycin] Rash   Zinacef [Cefuroxime] Rash    Social History   Socioeconomic History   Marital status: Widowed    Spouse name: Not on file   Number of children: Not on file   Years of education: Not on file   Highest education level: Not on file  Occupational History   Not on file  Tobacco Use   Smoking status: Never   Smokeless tobacco: Never   Tobacco comments:    Never smoke 08/10/21  Vaping Use   Vaping Use: Never used  Substance and Sexual Activity   Alcohol use: No   Drug use: No   Sexual activity: Not on file  Other Topics Concern   Not on file  Social History Narrative   Not on file   Social Determinants of Health   Financial Resource Strain: Not on file  Food Insecurity: Not on file  Transportation Needs: Not on file  Physical Activity: Not on file  Stress: Not on file  Social Connections: Not on file  Intimate Partner Violence: Not on file     ROS- All systems are reviewed and negative except as per the HPI above.  Physical Exam: Vitals:   08/10/21 1414  BP: (!) 148/70  Pulse: 60  Weight: 74.8 kg  Height: '5\' 1"'$  (1.549 m)     GEN- The patient is a well appearing obese female, alert and oriented x 3 today.   HEENT-head normocephalic, atraumatic, sclera clear, conjunctiva pink, hearing intact, trachea midline. Lungs- Clear to ausculation bilaterally, normal work of breathing Heart- Regular rate and rhythm, no murmurs, rubs or gallops  GI- soft, NT, ND, +  BS Extremities- no clubbing, cyanosis, or edema MS- no significant deformity or atrophy Skin- no rash or lesion Psych- euthymic mood, full affect Neuro- strength and sensation are intact   Wt Readings from Last 3 Encounters:  08/10/21 74.8 kg  07/31/21 75.7 kg  07/31/21 75.7 kg    EKG today demonstrates  SR Vent. rate 60 BPM PR interval 160 ms QRS duration 82 ms QT/QTcB 420/420 ms  Echo 04/14/21 demonstrated   1. Left ventricular ejection fraction, by estimation, is 60 to 65%. The  left ventricle has normal function. The left ventricle has no regional  wall motion abnormalities. There is mild left ventricular hypertrophy.  Left ventricular diastolic parameters are indeterminate.   2. Right ventricular systolic function is normal. The right ventricular  size is  normal. Tricuspid regurgitation signal is inadequate for assessing PA pressure.   3. Left atrial size was severely dilated.   4. Right atrial size was mildly dilated.   5. The mitral valve is normal in structure. Trivial mitral valve  regurgitation. No evidence of mitral stenosis.   6. The aortic valve is tricuspid. Aortic valve regurgitation is mild. No  aortic stenosis is present.   7. The inferior vena cava is normal in size with greater than 50%  respiratory variability, suggesting right atrial pressure of 3 mmHg.   Epic records are reviewed at length today  CHA2DS2-VASc Score = 4  The patient's score is based upon: CHF History: 0 HTN History: 1 Diabetes History: 1 Stroke History: 0 Vascular Disease History: 0 Age Score: 1 Gender Score: 1       ASSESSMENT AND PLAN: 1. Persistent Atrial Fibrillation (ICD10:  I48.19) The patient's CHA2DS2-VASc score is 4, indicating a 4.8% annual risk of stroke.   S/p dofetilide loading 7/17-7/20/23 Patient appears to be maintaining SR. Continue dofetilide 500 mcg BID. QT stable. Check bmet/mag today. Continue Xarelto 20 mg daily Continue diltiazem 360 mg daily  2.  Secondary Hypercoagulable State (ICD10:  D68.69) The patient is at significant risk for stroke/thromboembolism based upon her CHA2DS2-VASc Score of 4.  Continue Rivaroxaban (Xarelto).   3. Obesity Body mass index is 31.18 kg/m. Lifestyle modification was discussed and encouraged including regular physical activity and weight reduction.  4. HTN Stable, no changes today.   Follow up in the AF clinic in one month then Dr Lovena Le as scheduled.    [facility] [address] [place], [place] [address] [phone] 08/10/2021 2:29 PM

## 2021-09-06 IMAGING — MG DIGITAL SCREENING BILAT W/ TOMO W/ CAD
6 of 12 series · 6 of 36 positions shown · non-contrast
Comparison: Previous exam(s).

CLINICAL DATA: Screening.

EXAM:
DIGITAL SCREENING BILATERAL MAMMOGRAM WITH TOMO AND CAD

[L CC synth-2D]
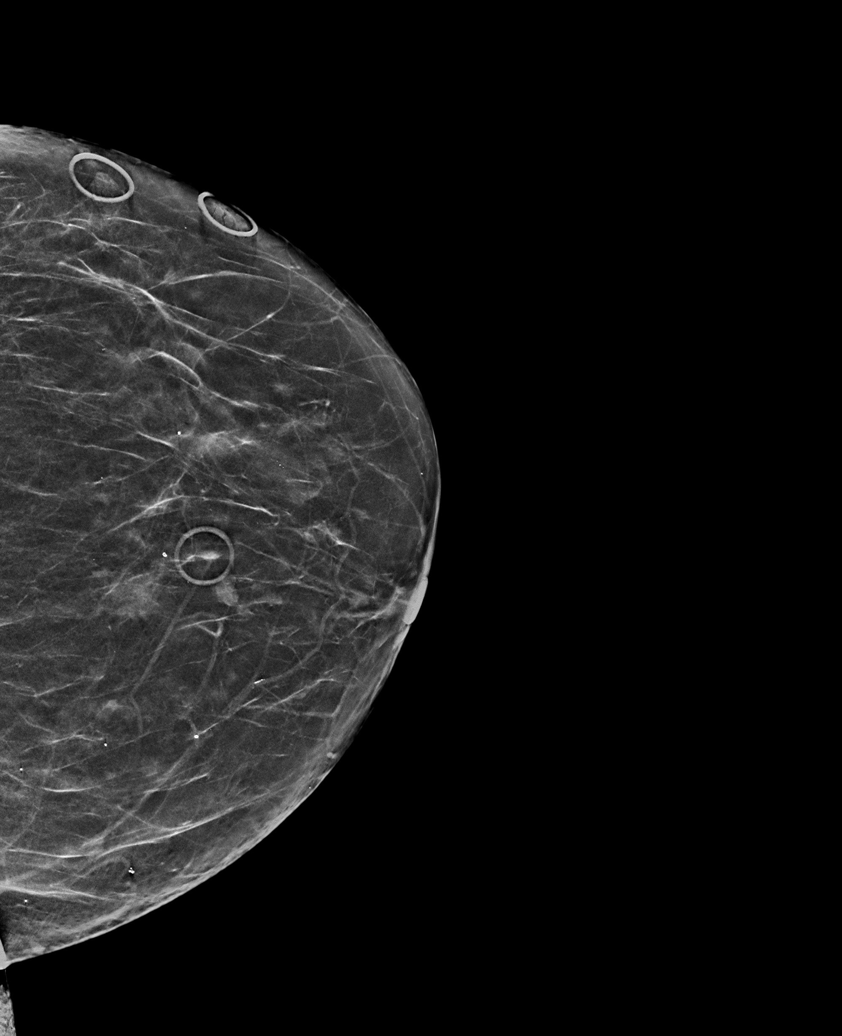

[R MLO synth-2D (1 of 2)]
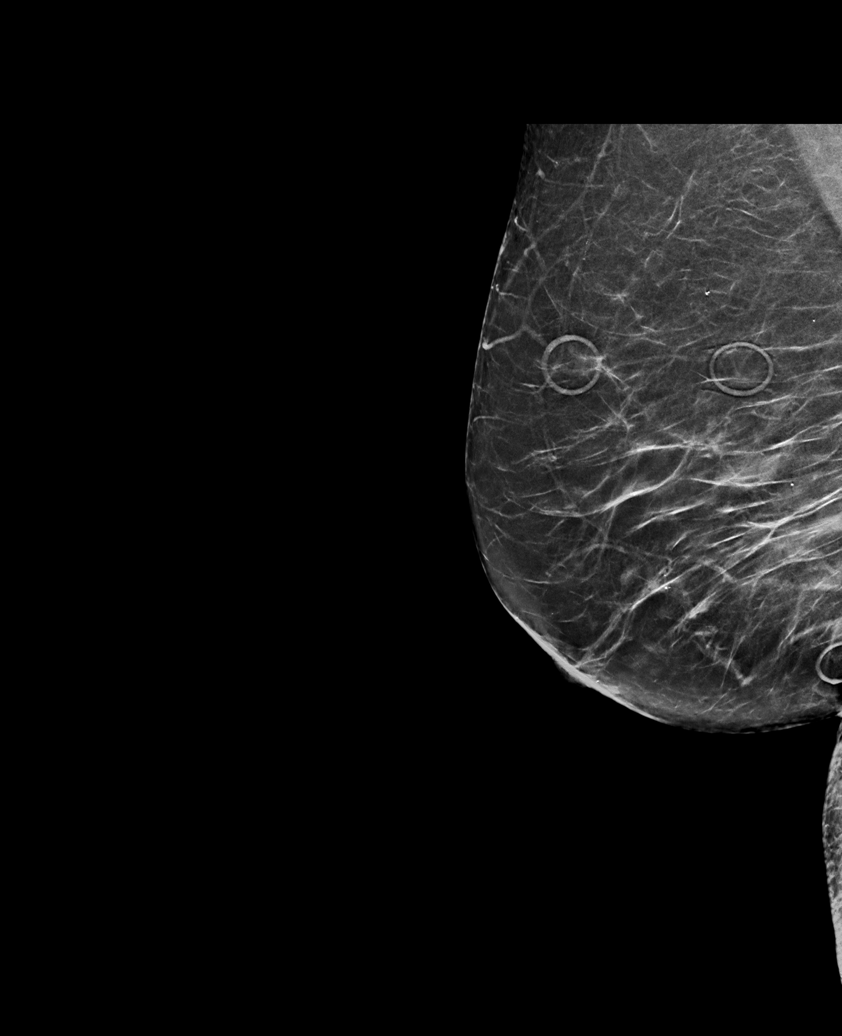

[L MLO synth-2D (1 of 2)]
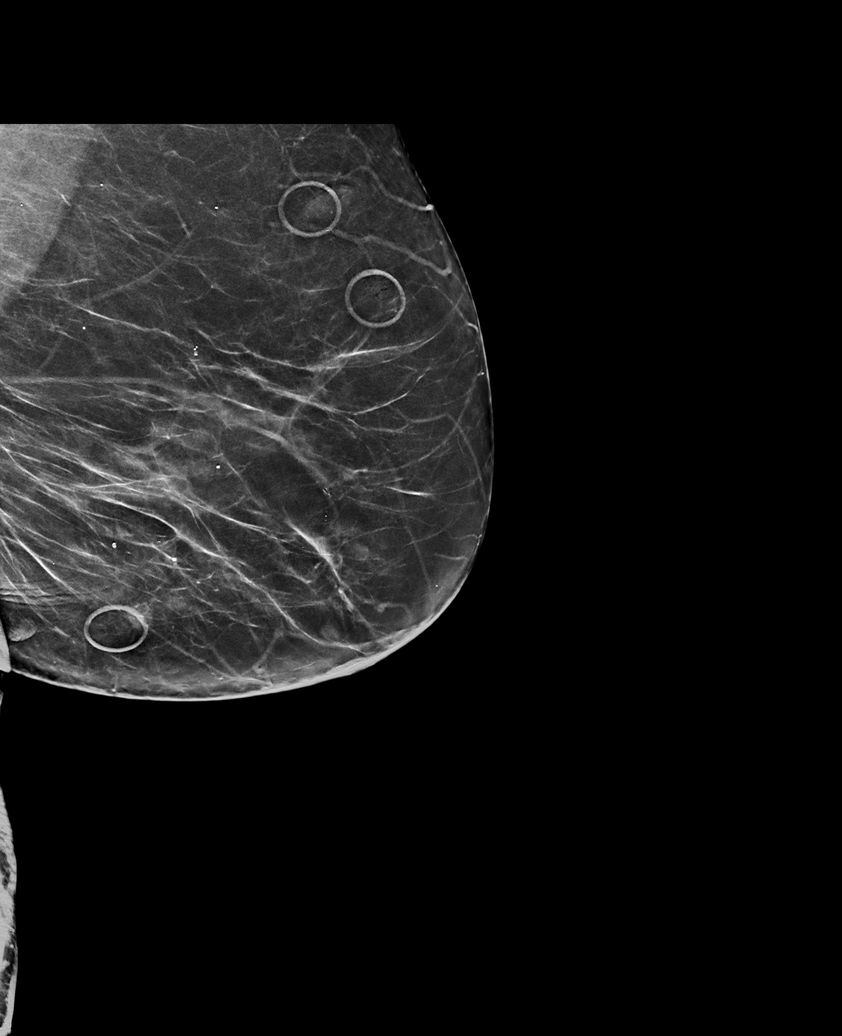

[R CC synth-2D]
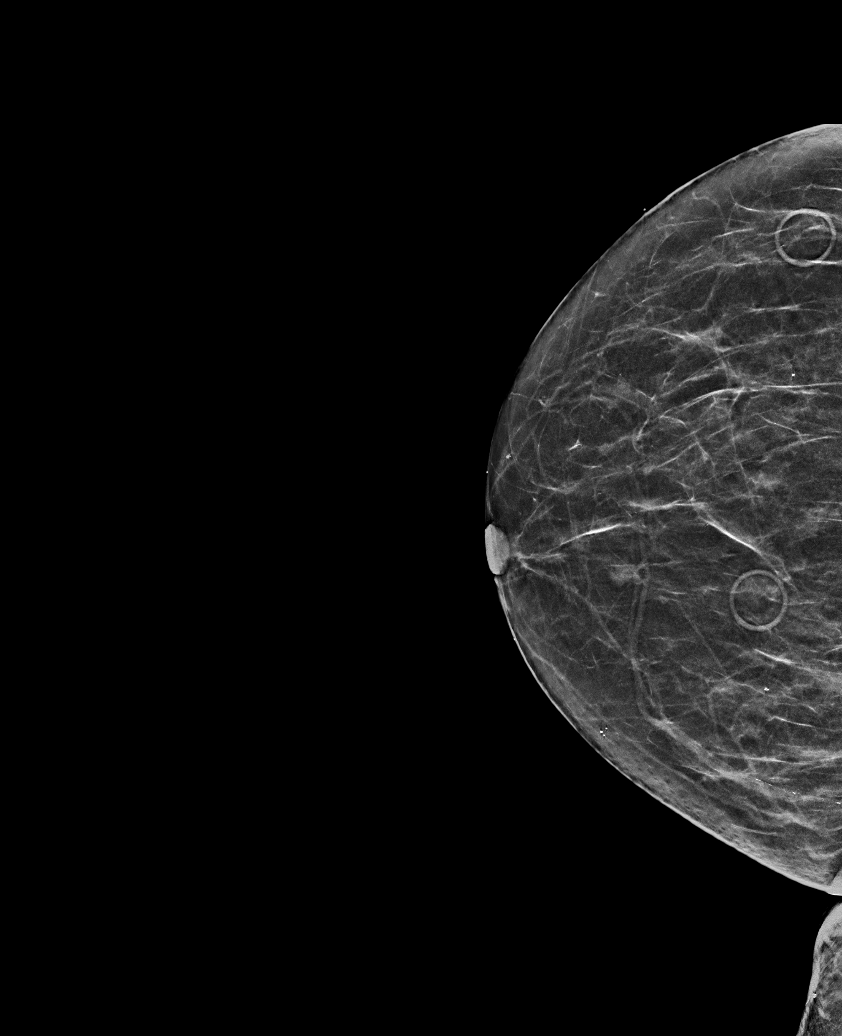

[R MLO synth-2D (2 of 2)]
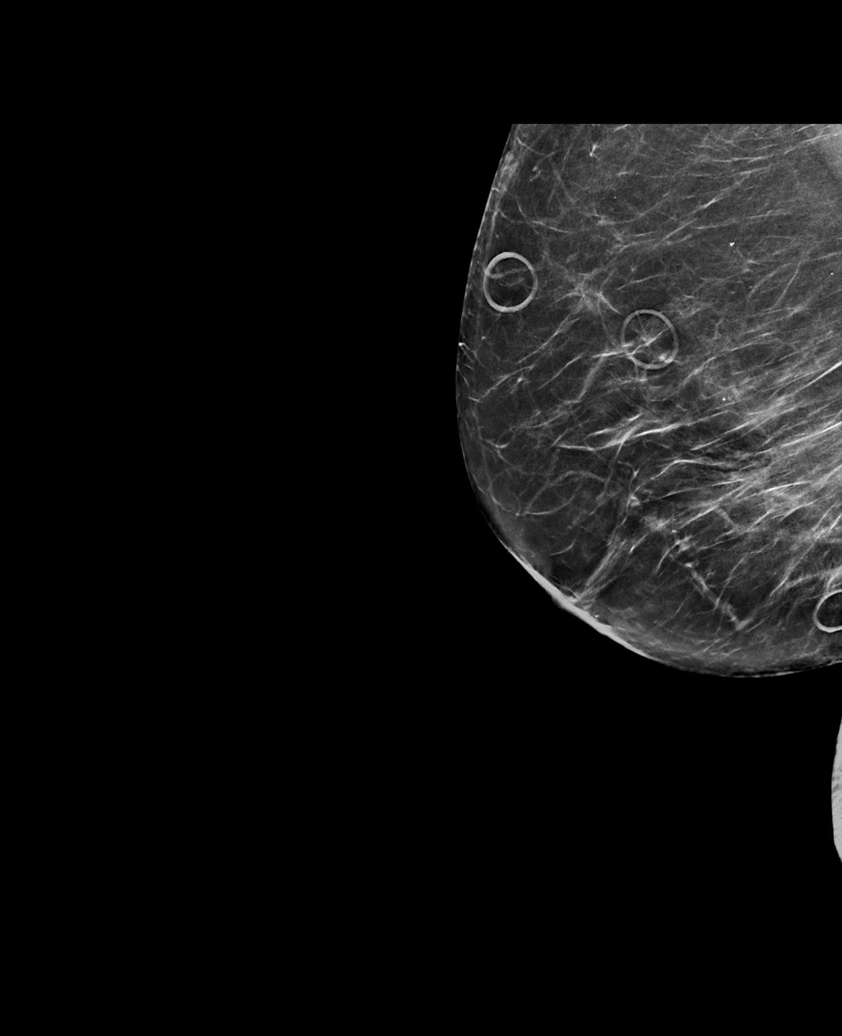

[L MLO synth-2D (2 of 2)]
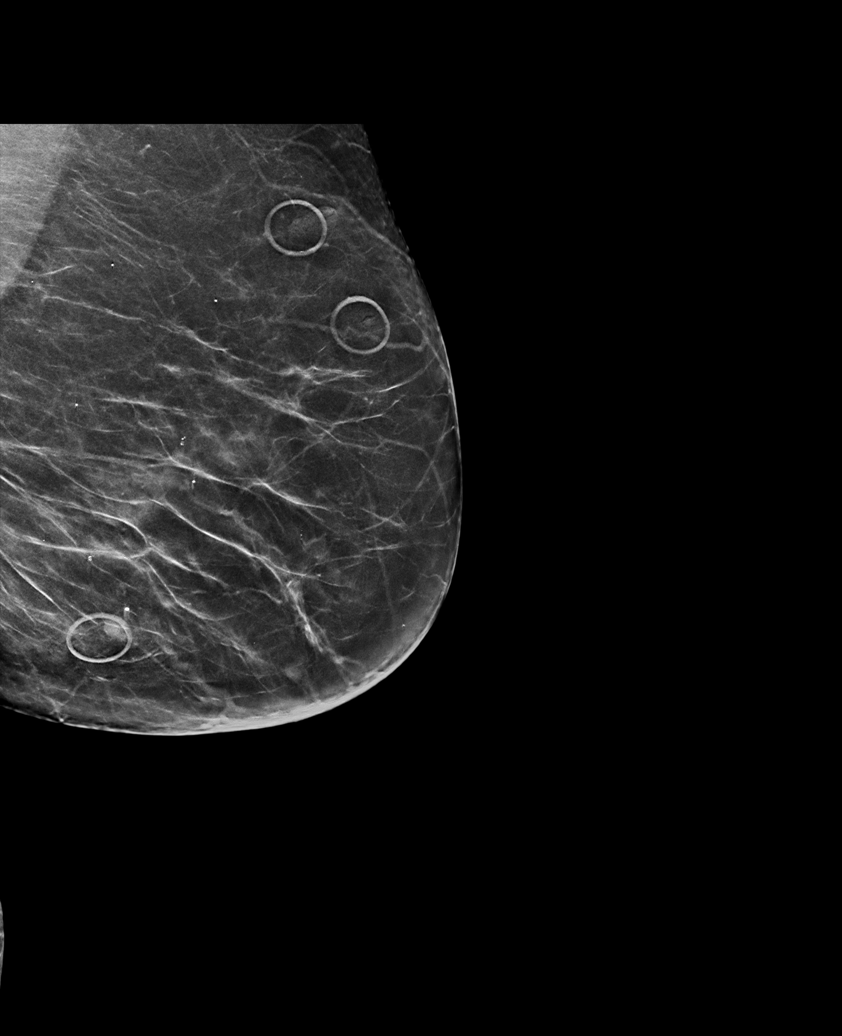

[6 of 36 positions shown; findings below may reference images not displayed]

ACR Breast Density Category b: There are scattered areas of
fibroglandular density.
FINDINGS: In the right breast, a possible mass warrants further evaluation. In
the left breast, no findings suspicious for malignancy. Images were
processed with CAD.
IMPRESSION: Further evaluation is suggested for a possible mass in the right
breast.

RECOMMENDATION:
Diagnostic mammogram and possibly ultrasound of the right breast.
(Code:E5-X-CC4)

The patient will be contacted regarding the findings, and additional
imaging will be scheduled.

BI-RADS CATEGORY  0: Incomplete. Need additional imaging evaluation
and/or prior mammograms for comparison.

## 2021-09-11 ENCOUNTER — Ambulatory Visit (HOSPITAL_COMMUNITY)
Admission: RE | Admit: 2021-09-11 | Discharge: 2021-09-11 | Disposition: A | Discharge status: Home / Self Care | Payer: Medicare PPO | Source: Ambulatory Visit | Attending: Physician Assistant | Admitting: Physician Assistant

## 2021-09-11 ENCOUNTER — Encounter (HOSPITAL_COMMUNITY): Payer: Self-pay | Admitting: Physician Assistant

## 2021-09-11 ENCOUNTER — Ambulatory Visit: Payer: Medicare PPO | Admitting: Student

## 2021-09-11 VITALS — BP 128/78 | HR 68 | Ht 61.0 in | Wt 165.4 lb

## 2021-09-11 DIAGNOSIS — I10 Essential (primary) hypertension: Secondary | ICD-10-CM | POA: Insufficient documentation

## 2021-09-11 DIAGNOSIS — D6869 Other thrombophilia: Secondary | ICD-10-CM | POA: Insufficient documentation

## 2021-09-11 DIAGNOSIS — I4819 Other persistent atrial fibrillation: Secondary | ICD-10-CM

## 2021-09-11 DIAGNOSIS — E669 Obesity, unspecified: Secondary | ICD-10-CM | POA: Diagnosis not present

## 2021-09-11 DIAGNOSIS — Z6831 Body mass index (BMI) 31.0-31.9, adult: Secondary | ICD-10-CM | POA: Diagnosis not present

## 2021-09-11 DIAGNOSIS — Z7901 Long term (current) use of anticoagulants: Secondary | ICD-10-CM | POA: Diagnosis not present

## 2021-09-11 DIAGNOSIS — E119 Type 2 diabetes mellitus without complications: Secondary | ICD-10-CM | POA: Diagnosis not present

## 2021-09-11 LAB — BASIC METABOLIC PANEL
Anion gap: 9 (ref 5–15)
BUN: 15 mg/dL (ref 8–23)
CO2: 25 mmol/L (ref 22–32)
Calcium: 9.4 mg/dL (ref 8.9–10.3)
Chloride: 104 mmol/L (ref 98–111)
Creatinine, Ser: 0.92 mg/dL (ref 0.44–1.00)
GFR, Estimated: >60 mL/min (ref >60)
Glucose, Bld: 106 mg/dL — ABNORMAL HIGH (ref 70–99)
Potassium: 4 mmol/L (ref 3.5–5.1)
Sodium: 138 mmol/L (ref 135–145)

## 2021-09-11 LAB — MAGNESIUM: Magnesium: 1.9 mg/dL (ref 1.7–2.4)

## 2021-09-11 NOTE — Progress Notes (Signed)
Primary Care Physician: Sharilyn Sites, MD Primary Cardiologist: Dr Johnsie Cancel Primary Electrophysiologist: Dr Lovena Le Referring Physician: Dr Bettye Boeck is a 71 y.o. female with a history of HTN, DM, atrial fibrillation who presents for follow up in the [facility]. Patient is on Xarelto for a CHADS2VASC score of 4. She underwent DCCV 05/09/21 but had early return of afib. She was seen by Dr Lovena Le who recommended dofetilide for rhythm control. Patient is s/p dofetilide admission 7/17-7/20/23. She converted with the medication and did not require DCCV.   On follow up today, patient reports that she has done well since her last visit. No tachypalpitations or bleeding issues. She is tolerating the medication without difficulty.    Today, she denies symptoms of palpitations, chest pain, shortness of breath, orthopnea, PND, lower extremity edema, dizziness, presyncope, syncope, snoring, daytime somnolence, bleeding, or neurologic sequela. The patient is tolerating medications without difficulties and is otherwise without complaint today.    Atrial Fibrillation Risk Factors:  she does not have symptoms or diagnosis of sleep apnea. she does not have a history of rheumatic fever.   she has a BMI of Body mass index is 31.25 kg/m.Marland Kitchen Filed Weights   09/11/21 0913  Weight: 75 kg    Family History  Problem Relation Age of Onset   Atrial fibrillation Mother    Colon cancer Neg Hx      Atrial Fibrillation Management history:  Previous antiarrhythmic drugs: dofetilide  Previous cardioversions: 05/09/21 Previous ablations: none CHADS2VASC score: 4 Anticoagulation history: Xarelto   Past Medical History:  Diagnosis Date   Anemia    Anxiety    Arthritis    Atrial fibrillation ([place]) 03/2021   Depression    Diabetes mellitus without complication ([place])    Dysrhythmia    Essential hypertension, benign 10/29/2017   Hypertension    Past Surgical  History:  Procedure Laterality Date   ABDOMINAL HYSTERECTOMY     ABDOMINAL SURGERY     to remove fibroid tumors   BIOPSY  11/25/2017   Procedure: BIOPSY;  Surgeon: Rogene Houston, MD;  [facility];  Service: Endoscopy;;  gastric   CARDIOVERSION N/A 05/09/2021   Procedure: CARDIOVERSION;  Surgeon: Sueanne Margarita, MD;  Location: [facility];  Service: Cardiovascular;  Laterality: N/A;   CHOLECYSTECTOMY     COLONOSCOPY N/A 01/02/2013   Procedure: COLONOSCOPY;  Surgeon: Rogene Houston, MD;  [facility];  Service: Endoscopy;  Laterality: N/A;  825   ESOPHAGOGASTRODUODENOSCOPY N/A 11/25/2017   Procedure: ESOPHAGOGASTRODUODENOSCOPY (EGD);  Surgeon: Rogene Houston, MD;  [facility];  Service: Endoscopy;  Laterality: N/A;  12:00-moved to 11/11 @ 9:55am per Lelon Frohlich   REFRACTIVE SURGERY Bilateral    TOTAL KNEE ARTHROPLASTY Left 05/10/2020   Procedure: TOTAL KNEE ARTHROPLASTY;  Surgeon: Renette Butters, MD;  Location: [facility];  Service: Orthopedics;  Laterality: Left;    Current Outpatient Medications  Medication Sig Dispense Refill   acetaminophen (TYLENOL) 500 MG tablet Take 2 tablets (1,000 mg total) by mouth every 6 (six) hours as needed for mild pain or moderate pain. (Patient taking differently: Take 1,000 mg by mouth 2 (two) times daily as needed for mild pain.) 60 tablet 0   ALPRAZolam (XANAX) 0.5 MG tablet Take 0.25-0.5 mg by mouth at bedtime as needed for anxiety or sleep.      carboxymethylcellulose (REFRESH PLUS) 0.5 % SOLN Place 1 drop into both eyes daily.  Cholecalciferol (VITAMIN D3) 1000 units CAPS Take 1,000 Units by mouth daily.      diltiazem (TIAZAC) 360 MG 24 hr capsule Take 1 capsule (360 mg total) by mouth daily. 90 capsule 3   dofetilide (TIKOSYN) 500 MCG capsule Take 1 capsule (500 mcg total) by mouth 2 (two) times daily. 180 capsule 1   metFORMIN (GLUCOPHAGE) 500 MG tablet Take 250 mg by mouth 2 (two) times daily.     olmesartan (BENICAR)  20 MG tablet Take 20 mg by mouth daily.     Omega-3 Fatty Acids (FISH OIL) 1000 MG CAPS Take 1,000 mg by mouth daily.     potassium chloride (KLOR-CON) 10 MEQ tablet Take 1 tablet (10 mEq total) by mouth daily. 90 tablet 3   rivaroxaban (XARELTO) 20 MG TABS tablet Take 1 tablet (20 mg total) by mouth daily with supper. 30 tablet 11   trolamine salicylate (ASPERCREME) 10 % cream Apply 1 Application topically at bedtime as needed (hand pain).     No current facility-administered medications for this encounter.    Allergies  Allergen Reactions   Z-Pak [Azithromycin] Rash   Zinacef [Cefuroxime] Rash    Social History   Socioeconomic History   Marital status: Widowed    Spouse name: Not on file   Number of children: Not on file   Years of education: Not on file   Highest education level: Not on file  Occupational History   Not on file  Tobacco Use   Smoking status: Never   Smokeless tobacco: Never   Tobacco comments:    Never smoke 08/10/21  Vaping Use   Vaping Use: Never used  Substance and Sexual Activity   Alcohol use: No   Drug use: No   Sexual activity: Not on file  Other Topics Concern   Not on file  Social History Narrative   Not on file   Social Determinants of Health   Financial Resource Strain: Not on file  Food Insecurity: Not on file  Transportation Needs: Not on file  Physical Activity: Not on file  Stress: Not on file  Social Connections: Not on file  Intimate Partner Violence: Not on file     ROS- All systems are reviewed and negative except as per the HPI above.  Physical Exam: Vitals:   09/11/21 0913  BP: 128/78  Pulse: 68  Weight: 75 kg  Height: '5\' 1"'$  (1.549 m)    GEN- The patient is a well appearing obese female, alert and oriented x 3 today.   HEENT-head normocephalic, atraumatic, sclera clear, conjunctiva pink, hearing intact, trachea midline. Lungs- Clear to ausculation bilaterally, normal work of breathing Heart- Regular rate and  rhythm, no murmurs, rubs or gallops  GI- soft, NT, ND, + BS Extremities- no clubbing, cyanosis, or edema MS- no significant deformity or atrophy Skin- no rash or lesion Psych- euthymic mood, full affect Neuro- strength and sensation are intact   Wt Readings from Last 3 Encounters:  09/11/21 75 kg  08/10/21 74.8 kg  07/31/21 75.7 kg    EKG today demonstrates  SR Vent. rate 68 BPM PR interval 172 ms QRS duration 74 ms QT/QTcB 410/435 ms  Echo 04/14/21 demonstrated   1. Left ventricular ejection fraction, by estimation, is 60 to 65%. The  left ventricle has normal function. The left ventricle has no regional  wall motion abnormalities. There is mild left ventricular hypertrophy.  Left ventricular diastolic parameters are indeterminate.   2. Right ventricular systolic function is normal.  The right ventricular  size is normal. Tricuspid regurgitation signal is inadequate for assessing PA pressure.   3. Left atrial size was severely dilated.   4. Right atrial size was mildly dilated.   5. The mitral valve is normal in structure. Trivial mitral valve  regurgitation. No evidence of mitral stenosis.   6. The aortic valve is tricuspid. Aortic valve regurgitation is mild. No  aortic stenosis is present.   7. The inferior vena cava is normal in size with greater than 50%  respiratory variability, suggesting right atrial pressure of 3 mmHg.   Epic records are reviewed at length today  CHA2DS2-VASc Score = 4  The patient's score is based upon: CHF History: 0 HTN History: 1 Diabetes History: 1 Stroke History: 0 Vascular Disease History: 0 Age Score: 1 Gender Score: 1       ASSESSMENT AND PLAN: 1. Persistent Atrial Fibrillation (ICD10:  I48.19) The patient's CHA2DS2-VASc score is 4, indicating a 4.8% annual risk of stroke.   S/p dofetilide loading 7/17-7/20/23 Patient appears to be maintaining SR. Continue dofetilide 500 mcg BID. QT stable. Check bmet/mag today.  Continue  Xarelto 20 mg daily Continue diltiazem 360 mg daily  2. Secondary Hypercoagulable State (ICD10:  D68.69) The patient is at significant risk for stroke/thromboembolism based upon her CHA2DS2-VASc Score of 4.  Continue Rivaroxaban (Xarelto).   3. Obesity Body mass index is 31.25 kg/m. Lifestyle modification was discussed and encouraged including regular physical activity and weight reduction. We discussed a Mediterranean style diet.  4. HTN Stable, no changes today.   Follow up with Dr Lovena Le as scheduled. AF clinic in 6 months.    [facility] [address] [place], [place] [address] [phone] 09/11/2021 9:33 AM

## 2021-09-19 DIAGNOSIS — E119 Type 2 diabetes mellitus without complications: Secondary | ICD-10-CM | POA: Diagnosis not present

## 2021-12-12 ENCOUNTER — Telehealth: Payer: Self-pay | Admitting: Internal Medicine

## 2021-12-12 ENCOUNTER — Other Ambulatory Visit (HOSPITAL_COMMUNITY)
Admission: RE | Admit: 2021-12-12 | Discharge: 2021-12-12 | Disposition: A | Discharge status: Home / Self Care | Payer: Medicare PPO | Source: Ambulatory Visit | Attending: Internal Medicine | Admitting: Internal Medicine

## 2021-12-12 ENCOUNTER — Ambulatory Visit: Payer: Medicare PPO | Attending: Internal Medicine | Admitting: Internal Medicine

## 2021-12-12 ENCOUNTER — Encounter: Payer: Self-pay | Admitting: Internal Medicine

## 2021-12-12 VITALS — BP 140/80 | HR 60 | Ht 61.0 in | Wt 169.0 lb

## 2021-12-12 DIAGNOSIS — Z79899 Other long term (current) drug therapy: Secondary | ICD-10-CM | POA: Insufficient documentation

## 2021-12-12 LAB — BASIC METABOLIC PANEL
Anion gap: 8 (ref 5–15)
BUN: 18 mg/dL (ref 8–23)
CO2: 26 mmol/L (ref 22–32)
Calcium: 9.3 mg/dL (ref 8.9–10.3)
Chloride: 102 mmol/L (ref 98–111)
Creatinine, Ser: 0.76 mg/dL (ref 0.44–1.00)
GFR, Estimated: >60 mL/min (ref >60)
Glucose, Bld: 80 mg/dL (ref 70–99)
Potassium: 3.9 mmol/L (ref 3.5–5.1)
Sodium: 136 mmol/L (ref 135–145)

## 2021-12-12 MED ORDER — FUROSEMIDE 20 MG PO TABS: 20.0000 mg | ORAL_TABLET | Freq: Every day | ORAL | 3 refills | Status: AC | PRN

## 2021-12-12 NOTE — Telephone Encounter (Signed)
Pt returning call for lab results  

## 2021-12-12 NOTE — Patient Instructions (Signed)
Medication Instructions:  Take Lasix 20 mg daily as needed   Continue all other medications   Labwork: BMET Today  Testing/Procedures: None today  Follow-Up: June 2024 with Dr.Taylor  Any Other Special Instructions Will Be Listed Below (If Applicable).  If you need a refill on your cardiac medications before your next appointment, please call your pharmacy.

## 2021-12-12 NOTE — Progress Notes (Signed)
HPI Patricia Sharp returns for followup of atrial fib. She is a pleasant 71 yo woman with HTN and was found to have atrial fib with a controlled VR. She had an echo demonstrating LAE with a LA size of 54 and preserved LV function. She underwent DCCV and went back to NSR but then had ERAF. She then was admitted for dofetilide and is better and appears to be maintaining NSR. She notes some peripheral edema. She had been on HCTZ but this was stopped when we started the dofetilide. Allergies  Allergen Reactions   Z-Pak [Azithromycin] Rash   Zinacef [Cefuroxime] Rash     Current Outpatient Medications  Medication Sig Dispense Refill   acetaminophen (TYLENOL) 500 MG tablet Take 2 tablets (1,000 mg total) by mouth every 6 (six) hours as needed for mild pain or moderate pain. (Patient taking differently: Take 1,000 mg by mouth 2 (two) times daily as needed for mild pain.) 60 tablet 0   ALPRAZolam (XANAX) 0.5 MG tablet Take 0.25-0.5 mg by mouth at bedtime as needed for anxiety or sleep.      carboxymethylcellulose (REFRESH PLUS) 0.5 % SOLN Place 1 drop into both eyes daily.     Cholecalciferol (VITAMIN D3) 1000 units CAPS Take 1,000 Units by mouth daily.      diltiazem (TIAZAC) 360 MG 24 hr capsule Take 1 capsule (360 mg total) by mouth daily. 90 capsule 3   dofetilide (TIKOSYN) 500 MCG capsule Take 1 capsule (500 mcg total) by mouth 2 (two) times daily. 180 capsule 1   metFORMIN (GLUCOPHAGE) 500 MG tablet Take 250 mg by mouth 2 (two) times daily.     olmesartan (BENICAR) 20 MG tablet Take 20 mg by mouth daily.     Omega-3 Fatty Acids (FISH OIL) 1000 MG CAPS Take 1,000 mg by mouth daily.     potassium chloride (KLOR-CON) 10 MEQ tablet Take 1 tablet (10 mEq total) by mouth daily. 90 tablet 3   rivaroxaban (XARELTO) 20 MG TABS tablet Take 1 tablet (20 mg total) by mouth daily with supper. 30 tablet 11   trolamine salicylate (ASPERCREME) 10 % cream Apply 1 Application topically at bedtime as needed  (hand pain).     No current facility-administered medications for this visit.     Past Medical History:  Diagnosis Date   Anemia    Anxiety    Arthritis    Atrial fibrillation ([place]) 03/2021   Depression    Diabetes mellitus without complication ([place])    Dysrhythmia    Essential hypertension, benign 10/29/2017   Hypertension     ROS:   All systems reviewed and negative except as noted in the HPI.   Past Surgical History:  Procedure Laterality Date   ABDOMINAL HYSTERECTOMY     ABDOMINAL SURGERY     to remove fibroid tumors   BIOPSY  11/25/2017   Procedure: BIOPSY;  Surgeon: Rogene Houston, MD;  [facility];  Service: Endoscopy;;  gastric   CARDIOVERSION N/A 05/09/2021   Procedure: CARDIOVERSION;  Surgeon: Sueanne Margarita, MD;  Location: [facility];  Service: Cardiovascular;  Laterality: N/A;   CHOLECYSTECTOMY     COLONOSCOPY N/A 01/02/2013   Procedure: COLONOSCOPY;  Surgeon: Rogene Houston, MD;  [facility];  Service: Endoscopy;  Laterality: N/A;  825   ESOPHAGOGASTRODUODENOSCOPY N/A 11/25/2017   Procedure: ESOPHAGOGASTRODUODENOSCOPY (EGD);  Surgeon: Rogene Houston, MD;  [facility];  Service: Endoscopy;  Laterality: N/A;  12:00-moved to 11/11 @ 9:55am per Lelon Frohlich   REFRACTIVE SURGERY Bilateral    TOTAL KNEE ARTHROPLASTY Left 05/10/2020   Procedure: TOTAL KNEE ARTHROPLASTY;  Surgeon: Renette Butters, MD;  [facility];  Service: Orthopedics;  Laterality: Left;     Family History  Problem Relation Age of Onset   Atrial fibrillation Mother    Colon cancer Neg Hx      Social History   Socioeconomic History   Marital status: Widowed    Spouse name: Not on file   Number of children: Not on file   Years of education: Not on file   Highest education level: Not on file  Occupational History   Not on file  Tobacco Use   Smoking status: Never   Smokeless tobacco: Never   Tobacco comments:    Never smoke 08/10/21  Vaping Use    Vaping Use: Never used  Substance and Sexual Activity   Alcohol use: No   Drug use: No   Sexual activity: Not on file  Other Topics Concern   Not on file  Social History Narrative   Not on file   Social Determinants of Health   Financial Resource Strain: Not on file  Food Insecurity: Not on file  Transportation Needs: Not on file  Physical Activity: Not on file  Stress: Not on file  Social Connections: Not on file  Intimate Partner Violence: Not on file     BP (!) 140/80 (BP Location: Right Arm, Patient Position: Sitting, Cuff Size: Large)   Pulse 60   Ht '5\' 1"'$  (1.549 m)   Wt 169 lb (76.7 kg)   BMI 31.93 kg/m   Physical Exam:  Well appearing NAD HEENT: Unremarkable Neck:  No JVD, no thyromegally Lymphatics:  No adenopathy Back:  No CVA tenderness Lungs:  Clear with no wheezes HEART:  Regular rate rhythm, no murmurs, no rubs, no clicks Abd:  soft, positive bowel sounds, no organomegally, no rebound, no guarding Ext:  2 plus pulses, no edema, no cyanosis, no clubbing Skin:  No rashes no nodules Neuro:  CN II through XII intact, motor grossly intact  Assess/Plan: Persistent atrial fib - she appears to be maintaining NSR on dofetilide.  Peripheral edema - I encouraged her to take as needed lasix, reduce her salt intake and keep her legs elevated.  Obesity - she is encouraged to lose weight.  Hypokalemia - the patient is concerned that her potassium is low. I will have her go and check her bmp.  Carleene Overlie Jamese Trauger,MD

## 2021-12-12 NOTE — Telephone Encounter (Signed)
Results given to patient,copied pcp

## 2021-12-13 DIAGNOSIS — H401131 Primary open-angle glaucoma, bilateral, mild stage: Secondary | ICD-10-CM | POA: Diagnosis not present

## 2021-12-13 DIAGNOSIS — H2513 Age-related nuclear cataract, bilateral: Secondary | ICD-10-CM | POA: Diagnosis not present

## 2022-02-06 DIAGNOSIS — Z6832 Body mass index (BMI) 32.0-32.9, adult: Secondary | ICD-10-CM | POA: Diagnosis not present

## 2022-02-06 DIAGNOSIS — E782 Mixed hyperlipidemia: Secondary | ICD-10-CM | POA: Diagnosis not present

## 2022-02-06 DIAGNOSIS — E118 Type 2 diabetes mellitus with unspecified complications: Secondary | ICD-10-CM | POA: Diagnosis not present

## 2022-02-06 DIAGNOSIS — E559 Vitamin D deficiency, unspecified: Secondary | ICD-10-CM | POA: Diagnosis not present

## 2022-02-06 DIAGNOSIS — I10 Essential (primary) hypertension: Secondary | ICD-10-CM | POA: Diagnosis not present

## 2022-02-06 DIAGNOSIS — I4891 Unspecified atrial fibrillation: Secondary | ICD-10-CM | POA: Diagnosis not present

## 2022-02-06 DIAGNOSIS — Z0000 Encounter for general adult medical examination without abnormal findings: Secondary | ICD-10-CM | POA: Diagnosis not present

## 2022-02-06 DIAGNOSIS — R7303 Prediabetes: Secondary | ICD-10-CM | POA: Diagnosis not present

## 2022-02-06 DIAGNOSIS — Z1331 Encounter for screening for depression: Secondary | ICD-10-CM | POA: Diagnosis not present

## 2022-02-06 DIAGNOSIS — D509 Iron deficiency anemia, unspecified: Secondary | ICD-10-CM | POA: Diagnosis not present

## 2022-02-26 ENCOUNTER — Other Ambulatory Visit (HOSPITAL_COMMUNITY): Payer: Self-pay | Admitting: Physician Assistant

## 2022-03-14 ENCOUNTER — Ambulatory Visit (HOSPITAL_COMMUNITY)
Admission: RE | Admit: 2022-03-14 | Discharge: 2022-03-14 | Disposition: A | Discharge status: Home / Self Care | Payer: Medicare PPO | Source: Ambulatory Visit | Attending: Physician Assistant | Admitting: Physician Assistant

## 2022-03-14 ENCOUNTER — Encounter (HOSPITAL_COMMUNITY): Payer: Self-pay | Admitting: Physician Assistant

## 2022-03-14 VITALS — BP 154/68 | HR 54 | Ht 61.0 in | Wt 169.8 lb

## 2022-03-14 DIAGNOSIS — E119 Type 2 diabetes mellitus without complications: Secondary | ICD-10-CM | POA: Insufficient documentation

## 2022-03-14 DIAGNOSIS — M7989 Other specified soft tissue disorders: Secondary | ICD-10-CM | POA: Diagnosis not present

## 2022-03-14 DIAGNOSIS — Z7984 Long term (current) use of oral hypoglycemic drugs: Secondary | ICD-10-CM | POA: Diagnosis not present

## 2022-03-14 DIAGNOSIS — D6869 Other thrombophilia: Secondary | ICD-10-CM | POA: Diagnosis not present

## 2022-03-14 DIAGNOSIS — I4819 Other persistent atrial fibrillation: Secondary | ICD-10-CM | POA: Diagnosis not present

## 2022-03-14 DIAGNOSIS — R600 Localized edema: Secondary | ICD-10-CM | POA: Insufficient documentation

## 2022-03-14 DIAGNOSIS — Z5181 Encounter for therapeutic drug level monitoring: Secondary | ICD-10-CM | POA: Diagnosis not present

## 2022-03-14 DIAGNOSIS — Z79899 Other long term (current) drug therapy: Secondary | ICD-10-CM | POA: Diagnosis not present

## 2022-03-14 DIAGNOSIS — Z96652 Presence of left artificial knee joint: Secondary | ICD-10-CM | POA: Insufficient documentation

## 2022-03-14 DIAGNOSIS — Z7901 Long term (current) use of anticoagulants: Secondary | ICD-10-CM | POA: Insufficient documentation

## 2022-03-14 DIAGNOSIS — I10 Essential (primary) hypertension: Secondary | ICD-10-CM | POA: Diagnosis not present

## 2022-03-14 LAB — CBC
HCT: 41.7 % (ref 36.0–46.0)
Hemoglobin: 14.1 g/dL (ref 12.0–15.0)
MCH: 30.7 pg (ref 26.0–34.0)
MCHC: 33.8 g/dL (ref 30.0–36.0)
MCV: 90.7 fL (ref 80.0–100.0)
Platelets: 278 10*3/uL (ref 150–400)
RBC: 4.6 MIL/uL (ref 3.87–5.11)
RDW: 13.2 % (ref 11.5–15.5)
WBC: 10.3 10*3/uL (ref 4.0–10.5)
nRBC: 0 % (ref 0.0–0.2)

## 2022-03-14 LAB — BASIC METABOLIC PANEL
Anion gap: 10 (ref 5–15)
BUN: 15 mg/dL (ref 8–23)
CO2: 25 mmol/L (ref 22–32)
Calcium: 9.1 mg/dL (ref 8.9–10.3)
Chloride: 103 mmol/L (ref 98–111)
Creatinine, Ser: 0.88 mg/dL (ref 0.44–1.00)
GFR, Estimated: >60 mL/min (ref >60)
Glucose, Bld: 108 mg/dL — ABNORMAL HIGH (ref 70–99)
Potassium: 3.9 mmol/L (ref 3.5–5.1)
Sodium: 138 mmol/L (ref 135–145)

## 2022-03-14 LAB — BRAIN NATRIURETIC PEPTIDE: B Natriuretic Peptide: 160.5 pg/mL — ABNORMAL HIGH (ref 0.0–100.0)

## 2022-03-14 LAB — MAGNESIUM: Magnesium: 2 mg/dL (ref 1.7–2.4)

## 2022-03-14 NOTE — Progress Notes (Signed)
Primary Care Physician: Sharilyn Sites, MD Primary Cardiologist: Dr Johnsie Cancel Primary Electrophysiologist: Dr Lovena Le Referring Physician: Dr Bettye Boeck is a 72 y.o. female with a history of HTN, DM, atrial fibrillation who presents for follow up in the [facility]. Patient is on Xarelto for a CHADS2VASC score of 4. She underwent DCCV 05/09/21 but had early return of afib. She was seen by Dr Lovena Le who recommended dofetilide for rhythm control. Patient is s/p dofetilide admission 7/17-7/20/23. She converted with the medication and did not require DCCV.   On follow up today, she is doing well overall on tikosyn and xarelto regimen for AF. She has been compliant with her tikosyn and has not missed any doses. She has been compliant with xarelto daily. She possible had an episode of AF about 2 weeks ago but no method of documenting rhythm. Sometimes can feel heart racing at night but brief in nature and sporadic. She notes episodes are overall infrequent in nature.  She has concern for intermittent left leg swelling over the past few months. History of left knee replacement 2 years ago. She notes swelling will occur typically by the end of the day. She elevates leg while watching tv but admits to not using any compression stocking. She notes historically that swelling would improve with use of stocking. She denies any chest pain or shortness of breath. Right leg is unaffected. No pain in left leg. She takes lasix PRN but wondering if should take daily.   Today, she denies symptoms of palpitations, chest pain, shortness of breath, orthopnea, PND, lower extremity edema, dizziness, presyncope, syncope, snoring, daytime somnolence, bleeding, or neurologic sequela. The patient is tolerating medications without difficulties and is otherwise without complaint today.    Atrial Fibrillation Risk Factors:  she does not have symptoms or diagnosis of sleep apnea. she does not  have a history of rheumatic fever.   she has a BMI of Body mass index is 32.08 kg/m.Marland Kitchen Filed Weights   03/14/22 0941  Weight: 77 kg     Family History  Problem Relation Age of Onset   Atrial fibrillation Mother    Colon cancer Neg Hx      Atrial Fibrillation Management history:  Previous antiarrhythmic drugs: dofetilide  Previous cardioversions: 05/09/21 Previous ablations: none CHADS2VASC score: 4 Anticoagulation history: Xarelto   Past Medical History:  Diagnosis Date   Anemia    Anxiety    Arthritis    Atrial fibrillation ([place]) 03/2021   Depression    Diabetes mellitus without complication ([place])    Dysrhythmia    Essential hypertension, benign 10/29/2017   Hypertension    Past Surgical History:  Procedure Laterality Date   ABDOMINAL HYSTERECTOMY     ABDOMINAL SURGERY     to remove fibroid tumors   BIOPSY  11/25/2017   Procedure: BIOPSY;  Surgeon: Rogene Houston, MD;  [facility];  Service: Endoscopy;;  gastric   CARDIOVERSION N/A 05/09/2021   Procedure: CARDIOVERSION;  Surgeon: Sueanne Margarita, MD;  [facility];  Service: Cardiovascular;  Laterality: N/A;   CHOLECYSTECTOMY     COLONOSCOPY N/A 01/02/2013   Procedure: COLONOSCOPY;  Surgeon: Rogene Houston, MD;  [facility];  Service: Endoscopy;  Laterality: N/A;  825   ESOPHAGOGASTRODUODENOSCOPY N/A 11/25/2017   Procedure: ESOPHAGOGASTRODUODENOSCOPY (EGD);  Surgeon: Rogene Houston, MD;  [facility];  Service: Endoscopy;  Laterality: N/A;  12:00-moved to 11/11 @ 9:55am per  Ann   REFRACTIVE SURGERY Bilateral    TOTAL KNEE ARTHROPLASTY Left 05/10/2020   Procedure: TOTAL KNEE ARTHROPLASTY;  Surgeon: Renette Butters, MD;  [facility];  Service: Orthopedics;  Laterality: Left;    Current Outpatient Medications  Medication Sig Dispense Refill   acetaminophen (TYLENOL) 500 MG tablet Take 2 tablets (1,000 mg total) by mouth every 6 (six) hours as needed for mild pain  or moderate pain. (Patient taking differently: Take 1,000 mg by mouth 2 (two) times daily as needed for mild pain.) 60 tablet 0   ALPRAZolam (XANAX) 0.5 MG tablet Take 0.25-0.5 mg by mouth at bedtime as needed for anxiety or sleep.      carboxymethylcellulose (REFRESH PLUS) 0.5 % SOLN Place 1 drop into both eyes daily.     Cholecalciferol (VITAMIN D3) 1000 units CAPS Take 1,000 Units by mouth daily.      diltiazem (TIAZAC) 360 MG 24 hr capsule Take 1 capsule (360 mg total) by mouth daily. 90 capsule 3   dofetilide (TIKOSYN) 500 MCG capsule TAKE ONE CAPSULE (500 MCG TOTAL) BY MOUTH TWO TIMES DAILY. 180 capsule 1   furosemide (LASIX) 20 MG tablet Take 1 tablet (20 mg total) by mouth daily as needed. 90 tablet 3   metFORMIN (GLUCOPHAGE) 500 MG tablet Take 250 mg by mouth 2 (two) times daily.     olmesartan (BENICAR) 20 MG tablet Take 20 mg by mouth daily.     Omega-3 Fatty Acids (FISH OIL) 1000 MG CAPS Take 1,000 mg by mouth daily.     potassium chloride (KLOR-CON) 10 MEQ tablet Take 1 tablet (10 mEq total) by mouth daily. 90 tablet 3   rivaroxaban (XARELTO) 20 MG TABS tablet Take 1 tablet (20 mg total) by mouth daily with supper. 30 tablet 11   trolamine salicylate (ASPERCREME) 10 % cream Apply 1 Application topically at bedtime as needed (hand pain).     No current facility-administered medications for this encounter.    Allergies  Allergen Reactions   Z-Pak [Azithromycin] Rash   Zinacef [Cefuroxime] Rash    Social History   Socioeconomic History   Marital status: Widowed    Spouse name: Not on file   Number of children: Not on file   Years of education: Not on file   Highest education level: Not on file  Occupational History   Not on file  Tobacco Use   Smoking status: Never   Smokeless tobacco: Never   Tobacco comments:    Never smoke 08/10/21  Vaping Use   Vaping Use: Never used  Substance and Sexual Activity   Alcohol use: No   Drug use: No   Sexual activity: Not on file   Other Topics Concern   Not on file  Social History Narrative   Not on file   Social Determinants of Health   Financial Resource Strain: Not on file  Food Insecurity: Not on file  Transportation Needs: Not on file  Physical Activity: Not on file  Stress: Not on file  Social Connections: Not on file  Intimate Partner Violence: Not on file     ROS- All systems are reviewed and negative except as per the HPI above.  Physical Exam: Vitals:   03/14/22 0941  BP: (!) 154/68  Pulse: (!) 54  Weight: 77 kg  Height: '5\' 1"'$  (1.549 m)     GEN- The patient is a well appearing  female, alert and oriented x 3 today.   HEENT-head normocephalic, atraumatic, sclera clear,  conjunctiva pink, hearing intact, trachea midline. Lungs- Clear to ausculation bilaterally, normal work of breathing Heart- Bradycardic rate and regular rhythm, no murmurs, rubs or gallops  GI- soft, NT, ND, + BS Extremities- no clubbing or cyanosis. Right leg without edema, left leg +1 pitting edema; no pain with palpation, no skin discoloration  MS- no significant deformity or atrophy Skin- no rash or lesion Psych- euthymic mood, full affect Neuro- strength and sensation are intact   Wt Readings from Last 3 Encounters:  03/14/22 77 kg  12/12/21 76.7 kg  09/11/21 75 kg    EKG today demonstrates  Sinus bradycardia HR 54; QT 440 ms  Echo 04/14/21 demonstrated   1. Left ventricular ejection fraction, by estimation, is 60 to 65%. The  left ventricle has normal function. The left ventricle has no regional  wall motion abnormalities. There is mild left ventricular hypertrophy.  Left ventricular diastolic parameters are indeterminate.   2. Right ventricular systolic function is normal. The right ventricular  size is normal. Tricuspid regurgitation signal is inadequate for assessing PA pressure.   3. Left atrial size was severely dilated.   4. Right atrial size was mildly dilated.   5. The mitral valve is normal in  structure. Trivial mitral valve  regurgitation. No evidence of mitral stenosis.   6. The aortic valve is tricuspid. Aortic valve regurgitation is mild. No  aortic stenosis is present.   7. The inferior vena cava is normal in size with greater than 50%  respiratory variability, suggesting right atrial pressure of 3 mmHg.   Epic records are reviewed at length today  CHA2DS2-VASc Score = 4  The patient's score is based upon: CHF History: 0 HTN History: 1 Diabetes History: 1 Stroke History: 0 Vascular Disease History: 0 Age Score: 1 Gender Score: 1       ASSESSMENT AND PLAN: 1. Persistent Atrial Fibrillation (ICD10:  I48.19) The patient's CHA2DS2-VASc score is 4, indicating a 4.8% annual risk of stroke.   S/p dofetilide loading 7/17-7/20/23 Patient appears to be maintaining SR.  Continue dofetilide 500 mcg BID. QT stable. Check bmet/mag/cbc today. Continue Xarelto 20 mg daily Continue diltiazem 360 mg daily  2. Secondary Hypercoagulable State (ICD10:  D68.69) The patient is at significant risk for stroke/thromboembolism based upon her CHA2DS2-VASc Score of 4.  Continue Rivaroxaban (Xarelto).   3. HTN Stable, no changes today.  4. Left leg swelling Discussion regarding elevation of leg, will rule out CHF with BNP ordered today. Emphasis to use compression stocking as it has helped previously. Suspect related to previous knee surgery.    Follow up with Dr Lovena Le as scheduled. AF clinic in 6 months.    [facility] [address] [place], [place] [address] [phone] 03/14/2022 11:00 AM

## 2022-03-26 ENCOUNTER — Other Ambulatory Visit: Payer: Self-pay | Admitting: *Deleted

## 2022-03-26 MED ORDER — RIVAROXABAN 20 MG PO TABS: 20.0000 mg | ORAL_TABLET | Freq: Every day | ORAL | 5 refills | Status: DC

## 2022-03-26 NOTE — Telephone Encounter (Signed)
Prescription refill request for Xarelto received.   Indication: afib  Last office visit:12/12/2021 Weight:  77 kg  Age: 72 yo  Scr: 0.88, 03/14/2022 CrCl: 70 ml/min   Refill sent.

## 2022-03-28 DIAGNOSIS — H401131 Primary open-angle glaucoma, bilateral, mild stage: Secondary | ICD-10-CM | POA: Diagnosis not present

## 2022-03-28 DIAGNOSIS — H2513 Age-related nuclear cataract, bilateral: Secondary | ICD-10-CM | POA: Diagnosis not present

## 2022-04-09 DIAGNOSIS — Z6833 Body mass index (BMI) 33.0-33.9, adult: Secondary | ICD-10-CM | POA: Diagnosis not present

## 2022-04-09 DIAGNOSIS — D72829 Elevated white blood cell count, unspecified: Secondary | ICD-10-CM | POA: Diagnosis not present

## 2022-04-09 DIAGNOSIS — E6609 Other obesity due to excess calories: Secondary | ICD-10-CM | POA: Diagnosis not present

## 2022-04-23 ENCOUNTER — Other Ambulatory Visit: Payer: Self-pay | Admitting: Cardiovascular Disease

## 2022-05-16 IMAGING — DX DG KNEE 1-2V PORT*L*
2 series · 2 of 2 positions shown · non-contrast
Comparison: 06/13/2017

CLINICAL DATA: Postop knee replacement

EXAM:
PORTABLE LEFT KNEE - 1-2 VIEW

[knee ap]
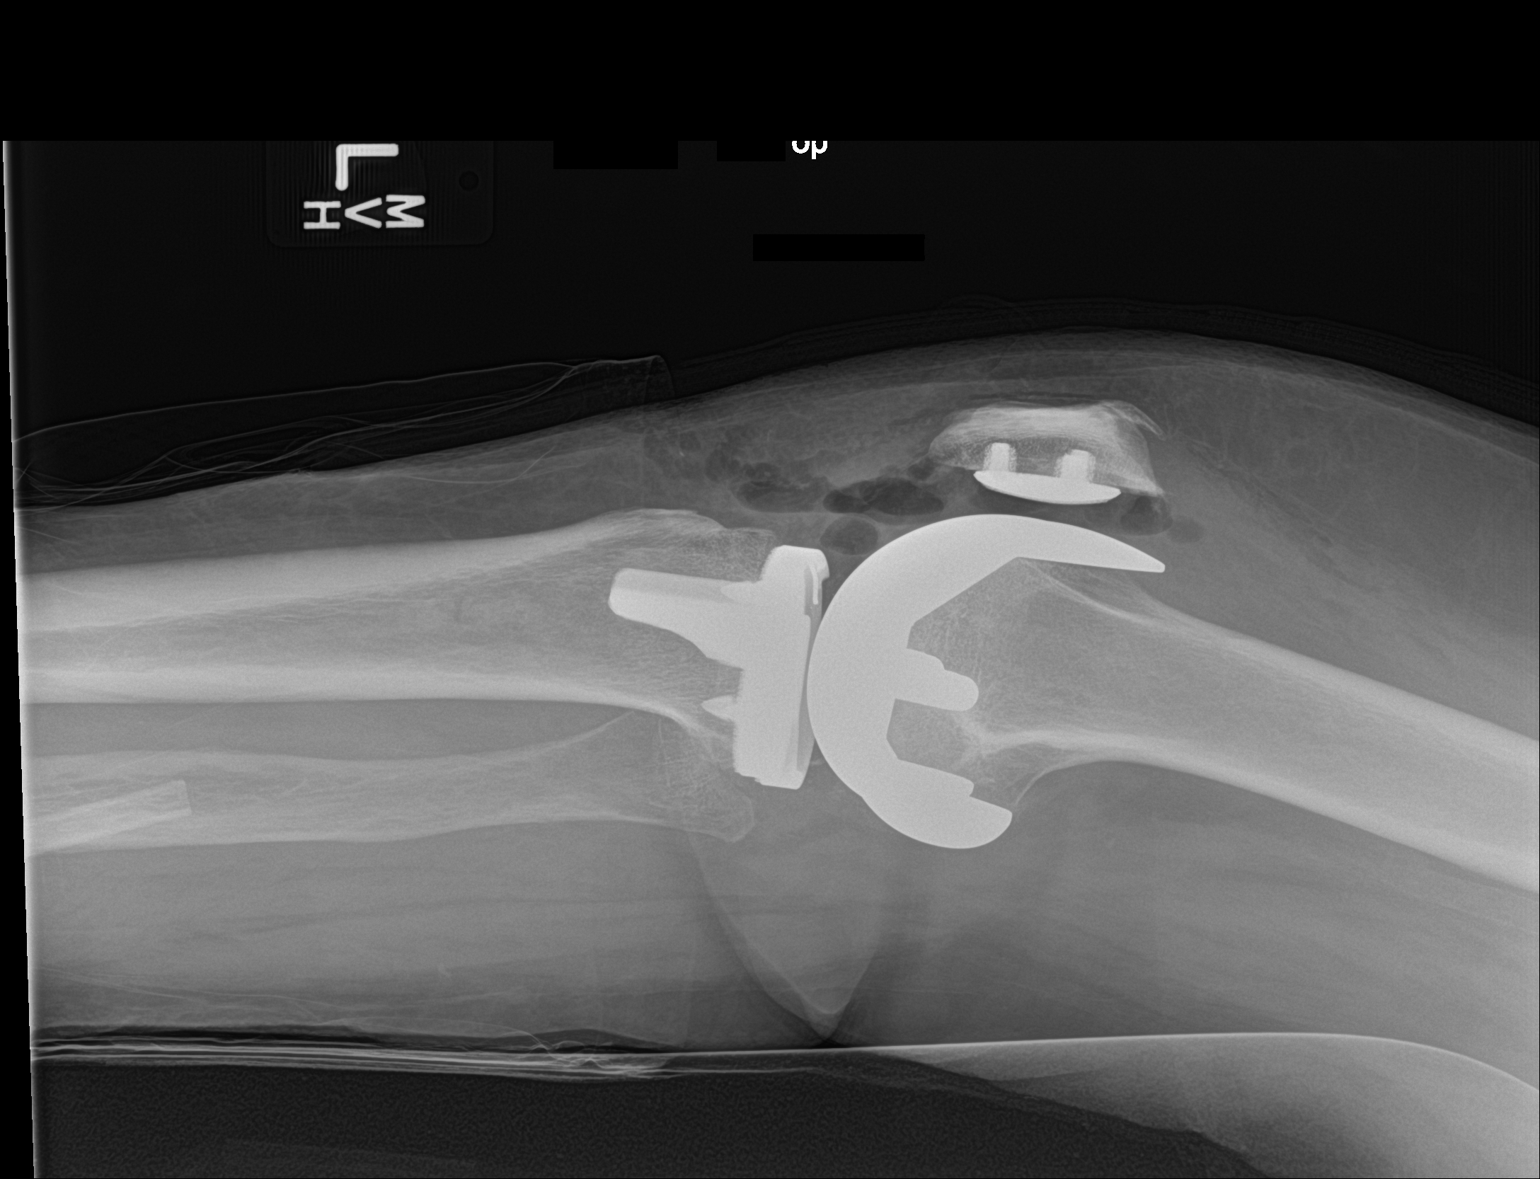

[knee lat]
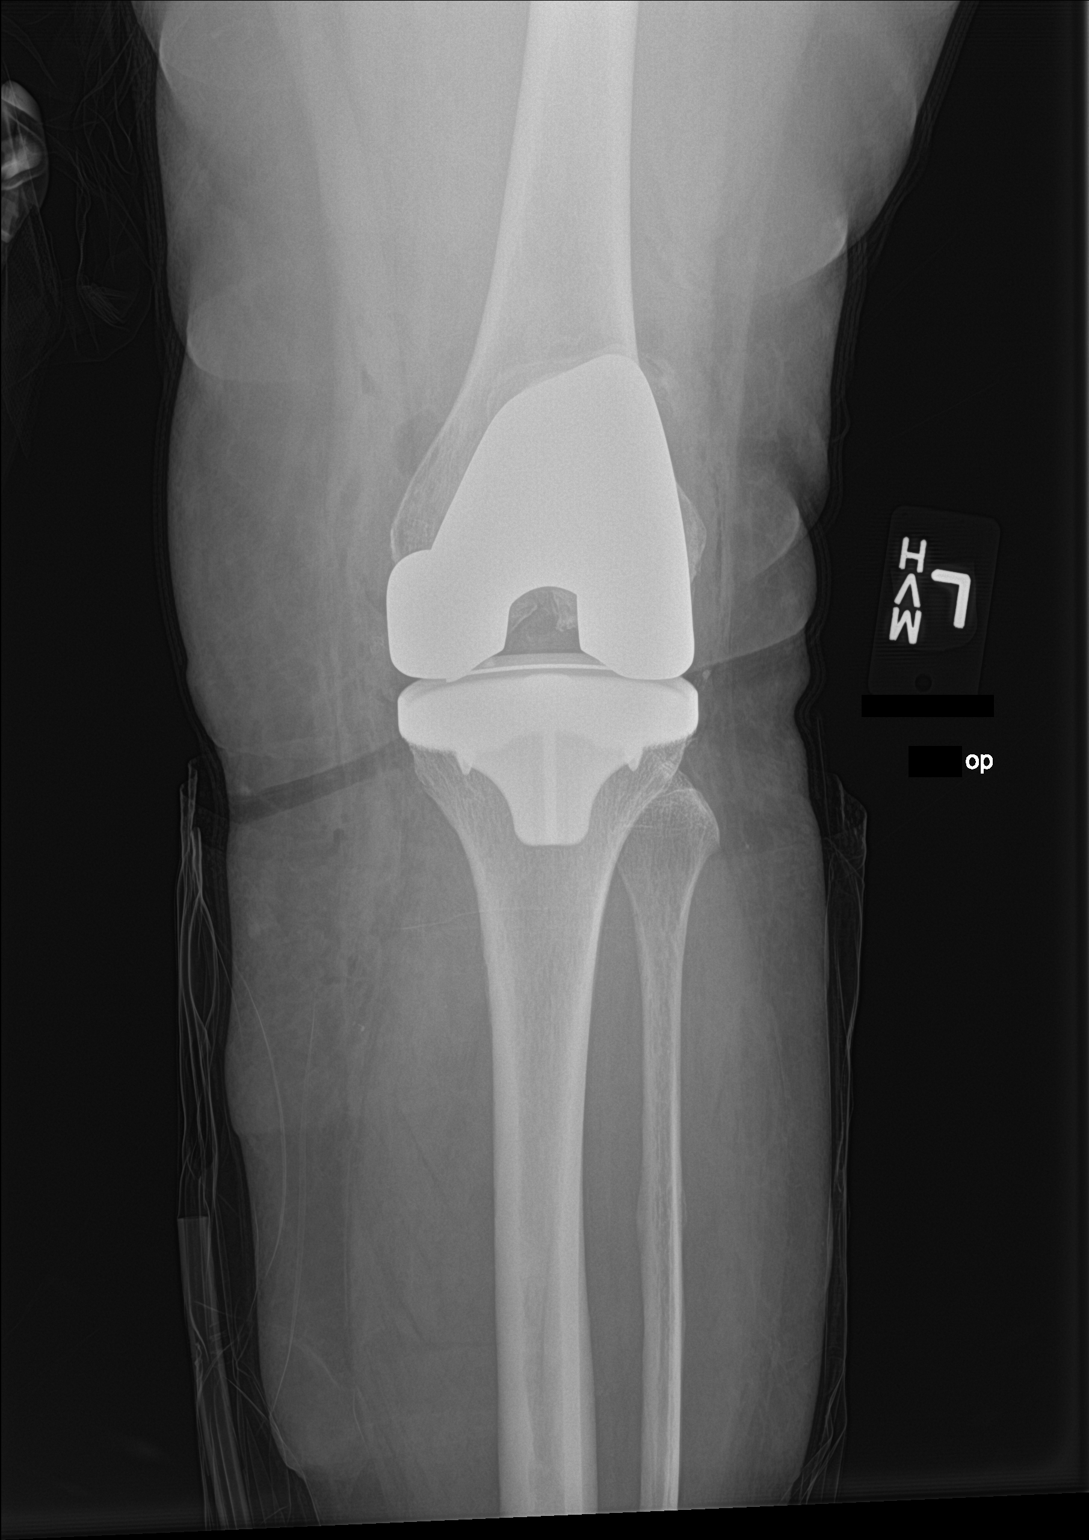

[2 of 2 positions shown; findings below may reference images not displayed]

FINDINGS: Total knee replacement has a good appearance. Small amount of fluid
and air in the joint as expected. No complicating feature.
IMPRESSION: Good appearance following total knee replacement.

## 2022-05-24 ENCOUNTER — Other Ambulatory Visit: Payer: Self-pay | Admitting: Cardiovascular Disease

## 2022-06-13 ENCOUNTER — Other Ambulatory Visit: Payer: Self-pay | Admitting: Cardiovascular Disease

## 2022-06-28 ENCOUNTER — Ambulatory Visit: Payer: Medicare PPO | Admitting: Internal Medicine

## 2022-07-10 ENCOUNTER — Ambulatory Visit: Payer: Medicare PPO | Attending: Internal Medicine | Admitting: Internal Medicine

## 2022-07-10 ENCOUNTER — Encounter: Payer: Self-pay | Admitting: Internal Medicine

## 2022-07-10 VITALS — BP 164/62 | HR 55 | Ht 61.0 in | Wt 169.0 lb

## 2022-07-10 DIAGNOSIS — I4819 Other persistent atrial fibrillation: Secondary | ICD-10-CM | POA: Diagnosis not present

## 2022-07-10 NOTE — Progress Notes (Signed)
HPI Patricia Sharp returns for followup of atrial fib. She is a pleasant 72 yo woman with HTN and was found to have atrial fib with a controlled VR. She had an echo demonstrating LAE with a LA size of 54 and preserved LV function. She underwent DCCV and went back to NSR but then had ERAF. She then was admitted for dofetilide and is better and appears to be maintaining NSR. She notes some peripheral edema. She had been on HCTZ but this was stopped when we started the dofetilide.  Allergies  Allergen Reactions   Z-Pak [Azithromycin] Rash   Zinacef [Cefuroxime] Rash     Current Outpatient Medications  Medication Sig Dispense Refill   acetaminophen (TYLENOL) 500 MG tablet Take 2 tablets (1,000 mg total) by mouth every 6 (six) hours as needed for mild pain or moderate pain. (Patient taking differently: Take 1,000 mg by mouth 2 (two) times daily as needed for mild pain.) 60 tablet 0   ALPRAZolam (XANAX) 0.5 MG tablet Take 0.25-0.5 mg by mouth at bedtime as needed for anxiety or sleep.      Cholecalciferol (VITAMIN D3) 1000 units CAPS Take 1,000 Units by mouth daily.      diltiazem (TIAZAC) 360 MG 24 hr capsule Take 1 capsule (360 mg total) by mouth daily. 90 capsule 3   dofetilide (TIKOSYN) 500 MCG capsule TAKE ONE CAPSULE (500 MCG TOTAL) BY MOUTH TWO TIMES DAILY. 180 capsule 1   furosemide (LASIX) 20 MG tablet Take 1 tablet (20 mg total) by mouth daily as needed. 90 tablet 3   metFORMIN (GLUCOPHAGE) 500 MG tablet Take 250 mg by mouth 2 (two) times daily.     olmesartan (BENICAR) 20 MG tablet Take 20 mg by mouth daily.     Omega-3 Fatty Acids (FISH OIL) 1000 MG CAPS Take 1,000 mg by mouth daily.     potassium chloride (KLOR-CON) 10 MEQ tablet TAKE ONE TABLET ( TOTAL) BY MOUTH DAILY 90 tablet 0   rivaroxaban (XARELTO) 20 MG TABS tablet Take 1 tablet (20 mg total) by mouth daily with supper. 30 tablet 5   trolamine salicylate (ASPERCREME) 10 % cream Apply 1 Application topically at bedtime as  needed (hand pain).     carboxymethylcellulose (REFRESH PLUS) 0.5 % SOLN Place 1 drop into both eyes daily. (Patient not taking: Reported on 07/10/2022)     No current facility-administered medications for this visit.     Past Medical History:  Diagnosis Date   Anemia    Anxiety    Arthritis    Atrial fibrillation ([facility]) 03/2021   Depression    Diabetes mellitus without complication ([facility])    Dysrhythmia    Essential hypertension, benign 10/29/2017   Hypertension     ROS:   All systems reviewed and negative except as noted in the HPI.   Past Surgical History:  Procedure Laterality Date   ABDOMINAL HYSTERECTOMY     ABDOMINAL SURGERY     to remove fibroid tumors   BIOPSY  11/25/2017   Procedure: BIOPSY;  Surgeon: Malissa Hippo, MD;  [facility];  Service: Endoscopy;;  gastric   CARDIOVERSION N/A 05/09/2021   Procedure: CARDIOVERSION;  Surgeon: Quintella Reichert, MD;  [facility];  Service: Cardiovascular;  Laterality: N/A;   CHOLECYSTECTOMY     COLONOSCOPY N/A 01/02/2013   Procedure: COLONOSCOPY;  Surgeon: Malissa Hippo, MD;  [facility];  Service: Endoscopy;  Laterality: N/A;  825  ESOPHAGOGASTRODUODENOSCOPY N/A 11/25/2017   Procedure: ESOPHAGOGASTRODUODENOSCOPY (EGD);  Surgeon: Malissa Hippo, MD;  [facility];  Service: Endoscopy;  Laterality: N/A;  12:00-moved to 11/11 @ 9:55am per Dewayne Hatch   REFRACTIVE SURGERY Bilateral    TOTAL KNEE ARTHROPLASTY Left 05/10/2020   Procedure: TOTAL KNEE ARTHROPLASTY;  Surgeon: Sheral Apley, MD;  [facility];  Service: Orthopedics;  Laterality: Left;     Family History  Problem Relation Age of Onset   Atrial fibrillation Mother    Colon cancer Neg Hx      Social History   Socioeconomic History   Marital status: Widowed    Spouse name: Not on file   Number of children: Not on file   Years of education: Not on file   Highest education level: Not on file  Occupational History    Not on file  Tobacco Use   Smoking status: Never   Smokeless tobacco: Never   Tobacco comments:    Never smoke 08/10/21  Vaping Use   Vaping Use: Never used  Substance and Sexual Activity   Alcohol use: No   Drug use: No   Sexual activity: Not on file  Other Topics Concern   Not on file  Social History Narrative   Not on file   Social Determinants of Health   Financial Resource Strain: Not on file  Food Insecurity: Not on file  Transportation Needs: Not on file  Physical Activity: Not on file  Stress: Not on file  Social Connections: Not on file  Intimate Partner Violence: Not on file     There were no vitals taken for this visit.  Physical Exam:  Well appearing NAD HEENT: Unremarkable Neck:  No JVD, no thyromegally Lymphatics:  No adenopathy Back:  No CVA tenderness Lungs:  Clear HEART:  Regular rate rhythm, no murmurs, no rubs, no clicks Abd:  soft, positive bowel sounds, no organomegally, no rebound, no guarding Ext:  2 plus pulses, no edema, no cyanosis, no clubbing Skin:  No rashes no nodules Neuro:  CN II through XII intact, motor grossly intact  EKG  DEVICE  Normal device function.  See PaceArt for details.   Assess/Plan:  Persistent atrial fib - she appears to be maintaining NSR on dofetilide.  Peripheral edema - I encouraged her to take as needed lasix, reduce her salt intake and keep her legs elevated.  Obesity - she is encouraged to lose weight.  Hypertertension - Her bp is up in the office but better at home. I encouraged her to avoid salty foods.   Patricia Gowda Jarell Mcewen,MD

## 2022-07-10 NOTE — Patient Instructions (Signed)
Medication Instructions:  Your physician recommends that you continue on your current medications as directed. Please refer to the Current Medication list given to you today.  *If you need a refill on your cardiac medications before your next appointment, please call your pharmacy*   Lab Work: None If you have labs (blood work) drawn today and your tests are completely normal, you will receive your results only by: MyChart Message (if you have MyChart) OR A paper copy in the mail If you have any lab test that is abnormal or we need to change your treatment, we will call you to review the results.   Testing/Procedures: None   Follow-Up: At [facility], you and your health needs are our priority.  As part of our continuing mission to provide you with exceptional heart care, we have created designated Provider Care Teams.  These Care Teams include your primary Cardiologist (physician) and Advanced Practice Providers (APPs -  Physician Assistants and Nurse Practitioners) who all work together to provide you with the care you need, when you need it.  We recommend signing up for the patient portal called "MyChart".  Sign up information is provided on this After Visit Summary.  MyChart is used to connect with patients for Virtual Visits (Telemedicine).  Patients are able to view lab/test results, encounter notes, upcoming appointments, etc.  Non-urgent messages can be sent to your provider as well.   To learn more about what you can do with MyChart, go to [email or web address].    Your next appointment:   1 year(s)  Provider:   Gregg Taylor, MD    Other Instructions    

## 2022-07-25 DIAGNOSIS — H2513 Age-related nuclear cataract, bilateral: Secondary | ICD-10-CM | POA: Diagnosis not present

## 2022-07-25 DIAGNOSIS — H35033 Hypertensive retinopathy, bilateral: Secondary | ICD-10-CM | POA: Diagnosis not present

## 2022-07-25 DIAGNOSIS — H401131 Primary open-angle glaucoma, bilateral, mild stage: Secondary | ICD-10-CM | POA: Diagnosis not present

## 2022-07-25 DIAGNOSIS — E119 Type 2 diabetes mellitus without complications: Secondary | ICD-10-CM | POA: Diagnosis not present

## 2022-07-31 ENCOUNTER — Other Ambulatory Visit (HOSPITAL_COMMUNITY): Payer: Self-pay | Admitting: Family Medicine

## 2022-07-31 DIAGNOSIS — Z1231 Encounter for screening mammogram for malignant neoplasm of breast: Secondary | ICD-10-CM

## 2022-08-15 ENCOUNTER — Ambulatory Visit (HOSPITAL_COMMUNITY)
Admission: RE | Admit: 2022-08-15 | Discharge: 2022-08-15 | Disposition: A | Discharge status: Home / Self Care | Payer: Medicare PPO | Source: Ambulatory Visit | Attending: Family Medicine | Admitting: Family Medicine

## 2022-08-15 DIAGNOSIS — Z1231 Encounter for screening mammogram for malignant neoplasm of breast: Secondary | ICD-10-CM | POA: Diagnosis not present

## 2022-08-24 ENCOUNTER — Other Ambulatory Visit (HOSPITAL_COMMUNITY): Payer: Self-pay | Admitting: Physician Assistant

## 2022-09-11 ENCOUNTER — Other Ambulatory Visit: Payer: Self-pay | Admitting: Cardiovascular Disease

## 2022-09-12 ENCOUNTER — Encounter (HOSPITAL_COMMUNITY): Payer: Self-pay | Admitting: Physician Assistant

## 2022-09-12 ENCOUNTER — Ambulatory Visit (HOSPITAL_COMMUNITY)
Admission: RE | Admit: 2022-09-12 | Discharge: 2022-09-12 | Disposition: A | Discharge status: Home / Self Care | Payer: Medicare PPO | Source: Ambulatory Visit | Attending: Physician Assistant | Admitting: Physician Assistant

## 2022-09-12 VITALS — BP 142/66 | HR 47 | Ht 61.0 in | Wt 171.8 lb

## 2022-09-12 DIAGNOSIS — Z5181 Encounter for therapeutic drug level monitoring: Secondary | ICD-10-CM | POA: Diagnosis not present

## 2022-09-12 DIAGNOSIS — I10 Essential (primary) hypertension: Secondary | ICD-10-CM | POA: Diagnosis not present

## 2022-09-12 DIAGNOSIS — R9431 Abnormal electrocardiogram [ECG] [EKG]: Secondary | ICD-10-CM | POA: Insufficient documentation

## 2022-09-12 DIAGNOSIS — Z79899 Other long term (current) drug therapy: Secondary | ICD-10-CM

## 2022-09-12 DIAGNOSIS — I4819 Other persistent atrial fibrillation: Secondary | ICD-10-CM | POA: Diagnosis not present

## 2022-09-12 DIAGNOSIS — Z7901 Long term (current) use of anticoagulants: Secondary | ICD-10-CM | POA: Insufficient documentation

## 2022-09-12 DIAGNOSIS — D6869 Other thrombophilia: Secondary | ICD-10-CM | POA: Diagnosis not present

## 2022-09-12 LAB — BASIC METABOLIC PANEL
Anion gap: 11 (ref 5–15)
BUN: 18 mg/dL (ref 8–23)
CO2: 23 mmol/L (ref 22–32)
Calcium: 9.2 mg/dL (ref 8.9–10.3)
Chloride: 102 mmol/L (ref 98–111)
Creatinine, Ser: 1.07 mg/dL — ABNORMAL HIGH (ref 0.44–1.00)
GFR, Estimated: 55 mL/min — ABNORMAL LOW (ref >60)
Glucose, Bld: 129 mg/dL — ABNORMAL HIGH (ref 70–99)
Potassium: 4.2 mmol/L (ref 3.5–5.1)
Sodium: 136 mmol/L (ref 135–145)

## 2022-09-12 LAB — MAGNESIUM: Magnesium: 2.1 mg/dL (ref 1.7–2.4)

## 2022-09-12 MED ORDER — DILTIAZEM HCL ER BEADS 300 MG PO CP24: 300.0000 mg | ORAL_CAPSULE | Freq: Every day | ORAL | 6 refills | Status: DC

## 2022-09-12 NOTE — Patient Instructions (Signed)
Decrease cardizem 300mg  once a day

## 2022-09-12 NOTE — Progress Notes (Signed)
Primary Care Physician: Assunta Found, MD Primary Cardiologist: Dr Eden Emms Primary Electrophysiologist: Dr Ladona Ridgel Referring Physician: Dr Netty Starring is a 72 y.o. female with a history of HTN, DM, atrial fibrillation who presents for follow up in the [facility]. Patient is on Xarelto for a CHADS2VASC score of 4. She underwent DCCV 05/09/21 but had early return of afib. She was seen by Dr Ladona Ridgel who recommended dofetilide for rhythm control. Patient is s/p dofetilide admission 7/17-7/20/23. She converted with the medication and did not require DCCV.   On follow up today, patient reports that she has done well from an afib standpoint. She did have one brief episode of tachypalpitations in the interim. She has noticed some intermittent dizziness and fatigue. No bleeding issues on anticoagulation.   Today, she denies symptoms of chest pain, shortness of breath, orthopnea, PND, lower extremity edema, presyncope, syncope, snoring, daytime somnolence, bleeding, or neurologic sequela. The patient is tolerating medications without difficulties and is otherwise without complaint today.    Atrial Fibrillation Risk Factors:  she does not have symptoms or diagnosis of sleep apnea. she does not have a history of rheumatic fever.   Atrial Fibrillation Management history:  Previous antiarrhythmic drugs: dofetilide  Previous cardioversions: 05/09/21 Previous ablations: none Anticoagulation history: Xarelto   Past Medical History:  Diagnosis Date   Anemia    Anxiety    Arthritis    Atrial fibrillation (HCC) 03/2021   Depression    Diabetes mellitus without complication (HCC)    Dysrhythmia    Essential hypertension, benign 10/29/2017   Hypertension     Current Outpatient Medications  Medication Sig Dispense Refill   acetaminophen (TYLENOL) 500 MG tablet Take 2 tablets (1,000 mg total) by mouth every 6 (six) hours as needed for mild pain or moderate  pain. (Patient taking differently: Take 1,000 mg by mouth 2 (two) times daily as needed for mild pain.) 60 tablet 0   ALPRAZolam (XANAX) 0.5 MG tablet Take 0.25-0.5 mg by mouth at bedtime as needed for anxiety or sleep.      carboxymethylcellulose (REFRESH PLUS) 0.5 % SOLN Place 1 drop into both eyes daily.     Cholecalciferol (VITAMIN D3) 1000 units CAPS Take 1,000 Units by mouth daily.      diltiazem (TIAZAC) 360 MG 24 hr capsule Take 1 capsule (360 mg total) by mouth daily. 90 capsule 3   dofetilide (TIKOSYN) 500 MCG capsule TAKE ONE CAPSULE (500 MCG TOTAL) BY MOUTH TWO TIMES DAILY. 180 capsule 2   furosemide (LASIX) 20 MG tablet Take 1 tablet (20 mg total) by mouth daily as needed. 90 tablet 3   metFORMIN (GLUCOPHAGE) 500 MG tablet Take 250 mg by mouth 2 (two) times daily.     olmesartan (BENICAR) 20 MG tablet Take 20 mg by mouth daily.     Omega-3 Fatty Acids (FISH OIL) 1000 MG CAPS Take 1,000 mg by mouth daily.     potassium chloride (KLOR-CON) 10 MEQ tablet TAKE ONE TABLET ( TOTAL) BY MOUTH DAILY 90 tablet 0   rivaroxaban (XARELTO) 20 MG TABS tablet Take 1 tablet (20 mg total) by mouth daily with supper. 30 tablet 5   trolamine salicylate (ASPERCREME) 10 % cream Apply 1 Application topically at bedtime as needed (hand pain).     rosuvastatin (CRESTOR) 10 MG tablet Take 10 mg by mouth daily. (Patient not taking: Reported on 09/12/2022)     No current facility-administered medications for this encounter.  ROS- All systems are reviewed and negative except as per the HPI above.  Physical Exam: Vitals:   09/12/22 1021  BP: (!) 142/66  Pulse: (!) 47  Weight: 77.9 kg  Height: 5\' 1"  (1.549 m)    GEN: Well nourished, well developed in no acute distress NECK: No JVD; No carotid bruits CARDIAC: Regular rate and rhythm, no murmurs, rubs, gallops RESPIRATORY:  Clear to auscultation without rales, wheezing or rhonchi  ABDOMEN: Soft, non-tender, non-distended EXTREMITIES:  No edema; No  deformity    Wt Readings from Last 3 Encounters:  09/12/22 77.9 kg  07/10/22 76.7 kg  03/14/22 77 kg    EKG today demonstrates  SB, T wave changes noted previously Vent. rate 47 BPM PR interval 170 ms QRS duration 78 ms QT/QTcB 500/442 ms  Echo 04/14/21 demonstrated   1. Left ventricular ejection fraction, by estimation, is 60 to 65%. The  left ventricle has normal function. The left ventricle has no regional  wall motion abnormalities. There is mild left ventricular hypertrophy.  Left ventricular diastolic parameters are indeterminate.   2. Right ventricular systolic function is normal. The right ventricular  size is normal. Tricuspid regurgitation signal is inadequate for assessing PA pressure.   3. Left atrial size was severely dilated.   4. Right atrial size was mildly dilated.   5. The mitral valve is normal in structure. Trivial mitral valve  regurgitation. No evidence of mitral stenosis.   6. The aortic valve is tricuspid. Aortic valve regurgitation is mild. No  aortic stenosis is present.   7. The inferior vena cava is normal in size with greater than 50%  respiratory variability, suggesting right atrial pressure of 3 mmHg.   Epic records are reviewed at length today  CHA2DS2-VASc Score = 4  The patient's score is based upon: CHF History: 0 HTN History: 1 Diabetes History: 1 Stroke History: 0 Vascular Disease History: 0 Age Score: 1 Gender Score: 1        ASSESSMENT AND PLAN: Persistent Atrial Fibrillation (ICD10:  I48.19) The patient's CHA2DS2-VASc score is 4, indicating a 4.8% annual risk of stroke.   S/p dofetilide loading 7/17-7/20/23 Patient appears to be maintaining SR Continue dofetilide 500 mcg BID, QT stable Check bmet/mag today Continue Xarelto 20 mg daily Decrease diltiazem to 300 mg daily given bradycardia, fatigue, and dizziness  Secondary Hypercoagulable State (ICD10:  D68.69) The patient is at significant risk for stroke/thromboembolism  based upon her CHA2DS2-VASc Score of 4.  Continue Rivaroxaban (Xarelto).   HTN Med changes as above.    Follow up in 6 months in the AF clinic and then with Dr Ladona Ridgel per recall.    Jorja Loa PA-C [facility] [address] [place], [place] [address] [phone] 09/12/2022 10:35 AM

## 2022-09-15 DIAGNOSIS — I4891 Unspecified atrial fibrillation: Secondary | ICD-10-CM | POA: Diagnosis not present

## 2022-09-15 DIAGNOSIS — E782 Mixed hyperlipidemia: Secondary | ICD-10-CM | POA: Diagnosis not present

## 2022-09-15 DIAGNOSIS — D6869 Other thrombophilia: Secondary | ICD-10-CM | POA: Diagnosis not present

## 2022-09-15 DIAGNOSIS — E1159 Type 2 diabetes mellitus with other circulatory complications: Secondary | ICD-10-CM | POA: Diagnosis not present

## 2022-09-15 DIAGNOSIS — I10 Essential (primary) hypertension: Secondary | ICD-10-CM | POA: Diagnosis not present

## 2022-09-19 ENCOUNTER — Other Ambulatory Visit: Payer: Self-pay | Admitting: Cardiovascular Disease

## 2022-09-20 NOTE — Telephone Encounter (Signed)
Prescription refill request for Xarelto received.  Indication: AF Last office visit: 09/12/22  C Fenton PA Weight: 77.9kg Age: 72 Scr: 1.07 on 09/12/22  Epic CrCl: 58.45  Based on above findings Xarelto 20mg  daily is the appropriate dose.  Refill approved.

## 2022-10-10 DIAGNOSIS — E559 Vitamin D deficiency, unspecified: Secondary | ICD-10-CM | POA: Diagnosis not present

## 2022-10-10 DIAGNOSIS — E7849 Other hyperlipidemia: Secondary | ICD-10-CM | POA: Diagnosis not present

## 2022-10-10 DIAGNOSIS — D72829 Elevated white blood cell count, unspecified: Secondary | ICD-10-CM | POA: Diagnosis not present

## 2022-10-10 DIAGNOSIS — D509 Iron deficiency anemia, unspecified: Secondary | ICD-10-CM | POA: Diagnosis not present

## 2022-10-10 DIAGNOSIS — E1159 Type 2 diabetes mellitus with other circulatory complications: Secondary | ICD-10-CM | POA: Diagnosis not present

## 2022-10-10 DIAGNOSIS — E6609 Other obesity due to excess calories: Secondary | ICD-10-CM | POA: Diagnosis not present

## 2022-10-10 DIAGNOSIS — D6869 Other thrombophilia: Secondary | ICD-10-CM | POA: Diagnosis not present

## 2022-10-10 DIAGNOSIS — Z23 Encounter for immunization: Secondary | ICD-10-CM | POA: Diagnosis not present

## 2022-10-10 DIAGNOSIS — F419 Anxiety disorder, unspecified: Secondary | ICD-10-CM | POA: Diagnosis not present

## 2022-10-10 DIAGNOSIS — E782 Mixed hyperlipidemia: Secondary | ICD-10-CM | POA: Diagnosis not present

## 2022-10-10 DIAGNOSIS — I10 Essential (primary) hypertension: Secondary | ICD-10-CM | POA: Diagnosis not present

## 2022-10-15 DIAGNOSIS — E1159 Type 2 diabetes mellitus with other circulatory complications: Secondary | ICD-10-CM | POA: Diagnosis not present

## 2022-10-15 DIAGNOSIS — D6869 Other thrombophilia: Secondary | ICD-10-CM | POA: Diagnosis not present

## 2022-10-15 DIAGNOSIS — I4891 Unspecified atrial fibrillation: Secondary | ICD-10-CM | POA: Diagnosis not present

## 2022-10-15 DIAGNOSIS — E782 Mixed hyperlipidemia: Secondary | ICD-10-CM | POA: Diagnosis not present

## 2022-11-15 DIAGNOSIS — E1159 Type 2 diabetes mellitus with other circulatory complications: Secondary | ICD-10-CM | POA: Diagnosis not present

## 2022-11-15 DIAGNOSIS — E782 Mixed hyperlipidemia: Secondary | ICD-10-CM | POA: Diagnosis not present

## 2022-11-15 DIAGNOSIS — D6869 Other thrombophilia: Secondary | ICD-10-CM | POA: Diagnosis not present

## 2022-11-15 DIAGNOSIS — I4891 Unspecified atrial fibrillation: Secondary | ICD-10-CM | POA: Diagnosis not present

## 2022-11-19 DIAGNOSIS — D72829 Elevated white blood cell count, unspecified: Secondary | ICD-10-CM | POA: Diagnosis not present

## 2022-11-28 DIAGNOSIS — E119 Type 2 diabetes mellitus without complications: Secondary | ICD-10-CM | POA: Diagnosis not present

## 2022-11-28 DIAGNOSIS — H2513 Age-related nuclear cataract, bilateral: Secondary | ICD-10-CM | POA: Diagnosis not present

## 2022-11-28 DIAGNOSIS — H401131 Primary open-angle glaucoma, bilateral, mild stage: Secondary | ICD-10-CM | POA: Diagnosis not present

## 2022-11-28 DIAGNOSIS — H35033 Hypertensive retinopathy, bilateral: Secondary | ICD-10-CM | POA: Diagnosis not present

## 2022-12-07 ENCOUNTER — Encounter (INDEPENDENT_AMBULATORY_CARE_PROVIDER_SITE_OTHER): Payer: Self-pay | Admitting: *Deleted

## 2022-12-18 DIAGNOSIS — E6609 Other obesity due to excess calories: Secondary | ICD-10-CM | POA: Diagnosis not present

## 2022-12-18 DIAGNOSIS — J069 Acute upper respiratory infection, unspecified: Secondary | ICD-10-CM | POA: Diagnosis not present

## 2022-12-18 DIAGNOSIS — Z6833 Body mass index (BMI) 33.0-33.9, adult: Secondary | ICD-10-CM | POA: Diagnosis not present

## 2022-12-21 DIAGNOSIS — H26492 Other secondary cataract, left eye: Secondary | ICD-10-CM | POA: Diagnosis not present

## 2023-01-15 DIAGNOSIS — I4891 Unspecified atrial fibrillation: Secondary | ICD-10-CM | POA: Diagnosis not present

## 2023-01-15 DIAGNOSIS — E1159 Type 2 diabetes mellitus with other circulatory complications: Secondary | ICD-10-CM | POA: Diagnosis not present

## 2023-01-15 DIAGNOSIS — D6869 Other thrombophilia: Secondary | ICD-10-CM | POA: Diagnosis not present

## 2023-02-11 DIAGNOSIS — Z1211 Encounter for screening for malignant neoplasm of colon: Secondary | ICD-10-CM | POA: Diagnosis not present

## 2023-02-11 DIAGNOSIS — Z1212 Encounter for screening for malignant neoplasm of rectum: Secondary | ICD-10-CM | POA: Diagnosis not present

## 2023-02-15 DIAGNOSIS — D6869 Other thrombophilia: Secondary | ICD-10-CM | POA: Diagnosis not present

## 2023-02-15 DIAGNOSIS — E782 Mixed hyperlipidemia: Secondary | ICD-10-CM | POA: Diagnosis not present

## 2023-02-15 DIAGNOSIS — I4891 Unspecified atrial fibrillation: Secondary | ICD-10-CM | POA: Diagnosis not present

## 2023-02-16 LAB — COLOGUARD: COLOGUARD: POSITIVE — AB

## 2023-02-19 DIAGNOSIS — F419 Anxiety disorder, unspecified: Secondary | ICD-10-CM | POA: Diagnosis not present

## 2023-02-19 DIAGNOSIS — E1159 Type 2 diabetes mellitus with other circulatory complications: Secondary | ICD-10-CM | POA: Diagnosis not present

## 2023-02-19 DIAGNOSIS — E782 Mixed hyperlipidemia: Secondary | ICD-10-CM | POA: Diagnosis not present

## 2023-02-19 DIAGNOSIS — D6869 Other thrombophilia: Secondary | ICD-10-CM | POA: Diagnosis not present

## 2023-02-19 DIAGNOSIS — I10 Essential (primary) hypertension: Secondary | ICD-10-CM | POA: Diagnosis not present

## 2023-02-19 DIAGNOSIS — E559 Vitamin D deficiency, unspecified: Secondary | ICD-10-CM | POA: Diagnosis not present

## 2023-02-19 DIAGNOSIS — Z0000 Encounter for general adult medical examination without abnormal findings: Secondary | ICD-10-CM | POA: Diagnosis not present

## 2023-02-19 DIAGNOSIS — E6609 Other obesity due to excess calories: Secondary | ICD-10-CM | POA: Diagnosis not present

## 2023-02-19 DIAGNOSIS — D509 Iron deficiency anemia, unspecified: Secondary | ICD-10-CM | POA: Diagnosis not present

## 2023-02-20 NOTE — Progress Notes (Signed)
 Referring Provider: Marvine Rush, MD Primary Care Physician:  Marvine Rush, MD Primary Gastroenterologist:  Dr. Shaaron per patient's request  Chief Complaint  Patient presents with   Colonoscopy    Colonoscopy screening     HPI:   Patricia Sharp is a 73 y.o. female presenting today at the request of Marvine Rush, MD for positive cologuard   Reviewed labs and referral: 11/19/2022: Hemoglobin 13.6. 02/11/2023: Positive Cologuard  Today:  No brbpr, melena, constipation, diarrhea, abdominal pain, unintentional weight loss, GERD, dysphagia, nausea, vomiting.   Last colonoscopy 01/02/2013: Examination performed to cecum. Two small polyps removed from the sigmoid colon; one was cold snared and other one removed using biopsy forceps. Mild sigmoid colon diverticulosis. Small external hemorrhoids and anal papillae Pathology was benign.    Past Medical History:  Diagnosis Date   Anemia    Anxiety    Arthritis    Atrial fibrillation (HCC) 03/2021   Depression    Diabetes mellitus without complication (HCC)    Dysrhythmia    Essential hypertension, benign 10/29/2017   Hypertension     Past Surgical History:  Procedure Laterality Date   ABDOMINAL HYSTERECTOMY     ABDOMINAL SURGERY     to remove fibroid tumors   BIOPSY  11/25/2017   Procedure: BIOPSY;  Surgeon: Golda Claudis PENNER, MD;  Location: [facility];  Service: Endoscopy;;  gastric   CARDIOVERSION N/A 05/09/2021   Procedure: CARDIOVERSION;  Surgeon: Shlomo Wilbert SAUNDERS, MD;  Location: [facility];  Service: Cardiovascular;  Laterality: N/A;   CHOLECYSTECTOMY     COLONOSCOPY N/A 01/02/2013   Procedure: COLONOSCOPY;  Surgeon: Claudis PENNER Golda, MD;  Location: [facility];  Service: Endoscopy;  Laterality: N/A;  825   ESOPHAGOGASTRODUODENOSCOPY N/A 11/25/2017   Procedure: ESOPHAGOGASTRODUODENOSCOPY (EGD);  Surgeon: Golda Claudis PENNER, MD;  Location: [facility];  Service: Endoscopy;  Laterality: N/A;  12:00-moved to 11/11 @ 9:55am  per Jenkins   REFRACTIVE SURGERY Bilateral    TOTAL KNEE ARTHROPLASTY Left 05/10/2020   Procedure: TOTAL KNEE ARTHROPLASTY;  Surgeon: Beverley Evalene BIRCH, MD;  Location: [facility];  Service: Orthopedics;  Laterality: Left;    Current Outpatient Medications  Medication Sig Dispense Refill   acetaminophen  (TYLENOL ) 500 MG tablet Take 2 tablets (1,000 mg total) by mouth every 6 (six) hours as needed for mild pain or moderate pain. (Patient taking differently: Take 1,000 mg by mouth 2 (two) times daily as needed for mild pain (pain score 1-3).) 60 tablet 0   ALPRAZolam  (XANAX ) 0.5 MG tablet Take 0.25-0.5 mg by mouth at bedtime as needed for anxiety or sleep.      carboxymethylcellulose (REFRESH PLUS) 0.5 % SOLN Place 1 drop into both eyes daily.     Cholecalciferol (VITAMIN D3) 1000 units CAPS Take 1,000 Units by mouth daily.      diltiazem  (TIAZAC ) 300 MG 24 hr capsule Take 1 capsule (300 mg total) by mouth daily. 30 capsule 6   dofetilide  (TIKOSYN ) 500 MCG capsule TAKE ONE CAPSULE (500 MCG TOTAL) BY MOUTH TWO TIMES DAILY. 180 capsule 2   metFORMIN  (GLUCOPHAGE ) 500 MG tablet Take 250 mg by mouth 2 (two) times daily.     olmesartan (BENICAR) 20 MG tablet Take 20 mg by mouth daily.     Omega-3 Fatty Acids (FISH OIL) 1000 MG CAPS Take 1,000 mg by mouth daily.     potassium chloride  (KLOR-CON ) 10 MEQ tablet TAKE ONE TABLET ( TOTAL) BY MOUTH DAILY 90 tablet 1  trolamine salicylate (ASPERCREME) 10 % cream Apply 1 Application topically at bedtime as needed (hand pain).     XARELTO  20 MG TABS tablet TAKE ONE (1) TABLET BY MOUTH EVERY DAY WITH SUPPER 30 tablet 5   furosemide  (LASIX ) 20 MG tablet Take 1 tablet (20 mg total) by mouth daily as needed. 90 tablet 3   rosuvastatin (CRESTOR) 10 MG tablet Take 10 mg by mouth daily. (Patient not taking: Reported on 02/21/2023)     No current facility-administered medications for this visit.    Allergies as of 02/21/2023 - Review Complete 02/21/2023  Allergen  Reaction Noted   Z-pak [azithromycin] Rash 03/08/2014   Zinacef [cefuroxime] Rash     Family History  Problem Relation Age of Onset   Atrial fibrillation Mother    Colon cancer Neg Hx     Social History   Socioeconomic History   Marital status: Widowed    Spouse name: Not on file   Number of children: Not on file   Years of education: Not on file   Highest education level: Not on file  Occupational History   Not on file  Tobacco Use   Smoking status: Never   Smokeless tobacco: Never   Tobacco comments:    Never smoke 08/10/21  Vaping Use   Vaping status: Never Used  Substance and Sexual Activity   Alcohol  use: No   Drug use: No   Sexual activity: Not on file  Other Topics Concern   Not on file  Social History Narrative   Not on file   Social Drivers of Health   Financial Resource Strain: Not on file  Food Insecurity: Not on file  Transportation Needs: Not on file  Physical Activity: Not on file  Stress: Not on file  Social Connections: Not on file  Intimate Partner Violence: Not on file    Review of Systems: Gen: Denies any fever, chills, cold or flu like symptoms, pre-syncope, or syncope.  CV: Denies chest pain, heart palpitations. Resp: Denies shortness of breath, cough.  GI: See HPI GU : Denies urinary burning, urinary frequency, urinary hesitancy MS: Denies joint pain.  Derm: Denies rash. Psych: Denies depression, anxiety. Heme: See HPI  Physical Exam: BP 139/75 (BP Location: Right Arm, Patient Position: Sitting, Cuff Size: Normal)   Pulse 61   Temp 97.6 F (36.4 C) (Temporal)   Ht 5' 1 (1.549 m)   Wt 167 lb 12.8 oz (76.1 kg)   BMI 31.71 kg/m  General:   Alert and oriented. Pleasant and cooperative. Well-nourished and well-developed.  Head:  Normocephalic and atraumatic. Eyes:  Without icterus, sclera clear and conjunctiva pink.  Ears:  Normal auditory acuity. Lungs:  Clear to auscultation bilaterally. No wheezes, rales, or rhonchi. No  distress.  Heart:  S1, S2 present without murmurs appreciated.  Abdomen:  +BS, soft, non-tender and non-distended. No HSM noted. No guarding or rebound. No masses appreciated.  Rectal:  Deferred  Msk:  Symmetrical without gross deformities. Normal posture. Extremities:  Without edema. Neurologic:  Alert and  oriented x4;  grossly normal neurologically. Skin:  Intact without significant lesions or rashes. Psych: Normal mood and affect.    Assessment:  73 year old female with history of atrial fibrillation on Xarelto , diabetes, depression, HTN, anxiety, presenting today at the request of Dr. Marvine to discuss scheduling colonoscopy due to positive Cologuard completed 02/11/2023.  Patient denies any significant GI symptoms.  No alarm symptoms.  Her last colonoscopy was in December 2014.  She had benign  polyps removed from her colon with recommendations to repeat in 10 years.   Plan:  Proceed with colonoscopy with propofol  by Dr. Shaaron in near future. The risks, benefits, and alternatives have been discussed with the patient in detail. The patient states understanding and desires to proceed.  ASA 3 We will get approval to hold Xarelto  for 48 hours prior to colonoscopy from cardiology Patient will hold metformin  morning of her procedure. Follow-up as needed   Josette Rudy RIGGERS [facility] 02/21/2023

## 2023-02-21 ENCOUNTER — Encounter: Payer: Self-pay | Admitting: Gastroenterology

## 2023-02-21 ENCOUNTER — Telehealth: Payer: Self-pay | Admitting: *Deleted

## 2023-02-21 ENCOUNTER — Ambulatory Visit: Payer: Medicare PPO | Admitting: Gastroenterology

## 2023-02-21 VITALS — BP 139/75 | HR 61 | Temp 97.6°F | Ht 61.0 in | Wt 167.8 lb

## 2023-02-21 DIAGNOSIS — R195 Other fecal abnormalities: Secondary | ICD-10-CM

## 2023-02-21 DIAGNOSIS — Z860100 Personal history of colon polyps, unspecified: Secondary | ICD-10-CM | POA: Diagnosis not present

## 2023-02-21 NOTE — Telephone Encounter (Signed)
  Request for patient to stop medication prior to procedure or is needing cleareance  02/21/23  Patricia Sharp [facility] Jun 10, 1950  What type of surgery is being performed? Colonoscopy  When is surgery scheduled? TBD  What type of clearance is required (medical or pharmacy to hold medication or both? medication  Are there any medications that need to be held prior to surgery and how long? Xarelto  x 2 days  Name of physician performing surgery?  Dr.Rourk [facility] at [employer]: [phone] Fax: [phone]  Anethesia type (none, local, MAC, general)? MAC

## 2023-02-21 NOTE — Patient Instructions (Signed)
 We will get you scheduled for a colonoscopy in the near future with Dr. Riley Cheadle.   We will reach out to your cardiologist to get approval for holding Xatrelto x 48 hours prior.    Shana Daring, PA-C [facility]astroenter[facility]

## 2023-02-22 ENCOUNTER — Encounter: Payer: Self-pay | Admitting: Gastroenterology

## 2023-02-25 ENCOUNTER — Telehealth: Payer: Self-pay | Admitting: *Deleted

## 2023-02-25 NOTE — Telephone Encounter (Signed)
 Primary Cardiologist:Peter Stann Earnest, MD   Preoperative team, please contact this patient and set up a phone call appointment for further preoperative risk assessment. Please obtain consent and complete medication review. Thank you for your help.   Per office protocol, patient can hold Xarelto  for 2 days prior to procedure.    I also confirmed the patient resides in the state of [place] . As per [facility] Medical Board telemedicine laws, the patient must reside in the state in which the provider is licensed.   Gerldine Koch, NP-C  02/25/2023, 11:35 AM [address] Office [phone] Fax [phone]

## 2023-02-25 NOTE — Telephone Encounter (Signed)
 Pt tele preop appt 03/08/23. Procedure will not be scheduled until pt has been cleared per the pt.    Med rec and consent are done.

## 2023-02-25 NOTE — Telephone Encounter (Signed)
 Patient with diagnosis of afib on Xarelto  for anticoagulation.    Procedure: Colonoscopy  Date of procedure: TBD   CHA2DS2-VASc Score = 4   This indicates a 4.8% annual risk of stroke. The patient's score is based upon: CHF History: 0 HTN History: 1 Diabetes History: 1 Stroke History: 0 Vascular Disease History: 0 Age Score: 1 Gender Score: 1      CrCl 44 ml/min Platelet count 278  Per office protocol, patient can hold Xarelto  for 2 days prior to procedure.    **This guidance is not considered finalized until pre-operative APP has relayed final recommendations.**

## 2023-02-25 NOTE — Telephone Encounter (Signed)
 Pt tele preop appt 03/08/23. Procedure will not be scheduled until pt has been cleared per the pt.   Med rec and consent are done.      Patient Consent for Virtual Visit        Patricia Sharp has provided verbal consent on 02/25/2023 for a virtual visit (video or telephone).   CONSENT FOR VIRTUAL VISIT FOR:  Patricia Sharp  By participating in this virtual visit I agree to the following:  I hereby voluntarily request, consent and authorize [facility] and its employed or contracted physicians, physician assistants, nurse practitioners or other licensed health care professionals (the Practitioner), to provide me with telemedicine health care services (the "Services") as deemed necessary by the treating Practitioner. I acknowledge and consent to receive the Services by the Practitioner via telemedicine. I understand that the telemedicine visit will involve communicating with the Practitioner through live audiovisual communication technology and the disclosure of certain medical information by electronic transmission. I acknowledge that I have been given the opportunity to request an in-person assessment or other available alternative prior to the telemedicine visit and am voluntarily participating in the telemedicine visit.  I understand that I have the right to withhold or withdraw my consent to the use of telemedicine in the course of my care at any time, without affecting my right to future care or treatment, and that the Practitioner or I may terminate the telemedicine visit at any time. I understand that I have the right to inspect all information obtained and/or recorded in the course of the telemedicine visit and may receive copies of available information for a reasonable fee.  I understand that some of the potential risks of receiving the Services via telemedicine include:  Delay or interruption in medical evaluation due to technological equipment failure or disruption; Information  transmitted may not be sufficient (e.g. poor resolution of images) to allow for appropriate medical decision making by the Practitioner; and/or  In rare instances, security protocols could fail, causing a breach of personal health information.  Furthermore, I acknowledge that it is my responsibility to provide information about my medical history, conditions and care that is complete and accurate to the best of my ability. I acknowledge that Practitioner's advice, recommendations, and/or decision may be based on factors not within their control, such as incomplete or inaccurate data provided by me or distortions of diagnostic images or specimens that may result from electronic transmissions. I understand that the practice of medicine is not an exact science and that Practitioner makes no warranties or guarantees regarding treatment outcomes. I acknowledge that a copy of this consent can be made available to me via my patient portal [facility] MyChart), or I can request a printed copy by calling the office of [facility].    I understand that my insurance will be billed for this visit.   I have read or had this consent read to me. I understand the contents of this consent, which adequately explains the benefits and risks of the Services being provided via telemedicine.  I have been provided ample opportunity to ask questions regarding this consent and the Services and have had my questions answered to my satisfaction. I give my informed consent for the services to be provided through the use of telemedicine in my medical care

## 2023-03-06 NOTE — Progress Notes (Unsigned)
 Virtual Visit via Telephone Note   Because of Patricia Sharp co-morbid illnesses, she is at least at moderate risk for complications without adequate follow up.  This format is felt to be most appropriate for this patient at this time.  Due to technical limitations with video connection Web designer), today's appointment will be conducted as an audio only telehealth visit, and Patricia Sharp verbally agreed to proceed in this manner.   All issues noted in this document were discussed and addressed.  No physical exam could be performed with this format.  Evaluation Performed:  Preoperative cardiovascular risk assessment _____________   Date:  03/08/2023   Patient ID:  Patricia Sharp, Patricia Sharp [birth year], MRN [id number] Patient Location:  Home Provider location:   Office  Primary Care Provider:  Assunta Found, MD Primary Cardiologist:  Charlton Haws, MD  Chief Complaint / Patient Profile   73 y.o. y/o female with a h/o HTN, DM, HL, atrial fibrillation. Patient is on Xarelto for a CHADS2VASC score of 4. She underwent DCCV 05/09/21 but had early return of afib. She was seen by Dr Ladona Ridgel who recommended dofetilide for rhythm control. Patient is s/p dofetilide admission 7/17-7/20/23. She converted with the medication and did not require DCCV.     She is pending colonoscopy with Dr.Rourke on date to be determined and presents today for telephonic preoperative cardiovascular risk assessment and recommendations concerning holding Xarelto peri-operatively.   History of Present Illness    Patricia Sharp is a 73 y.o. female who presents via audio/video conferencing for a telehealth visit today.  Pt was last seen in cardiology clinic on 09/12/2022 by by Alphonzo Severance, PA.  At that time Patricia Sharp was doing well .  The patient is now pending procedure as outlined above. Since her last visit, she   Past Medical History    Past Medical History:  Diagnosis Date   Anemia    Anxiety    Arthritis    Atrial  fibrillation (HCC) 03/2021   Depression    Diabetes mellitus without complication (HCC)    Dysrhythmia    Essential hypertension, benign 10/29/2017   Hypertension    Past Surgical History:  Procedure Laterality Date   ABDOMINAL HYSTERECTOMY     ABDOMINAL SURGERY     to remove fibroid tumors   BIOPSY  11/25/2017   Procedure: BIOPSY;  Surgeon: Malissa Hippo, MD;  Location: [facility];  Service: Endoscopy;;  gastric   CARDIOVERSION N/A 05/09/2021   Procedure: CARDIOVERSION;  Surgeon: Quintella Reichert, MD;  Location: [facility];  Service: Cardiovascular;  Laterality: N/A;   CHOLECYSTECTOMY     COLONOSCOPY N/A 01/02/2013   Procedure: COLONOSCOPY;  Surgeon: Malissa Hippo, MD;  Location: [facility];  Service: Endoscopy;  Laterality: N/A;  825   ESOPHAGOGASTRODUODENOSCOPY N/A 11/25/2017   Procedure: ESOPHAGOGASTRODUODENOSCOPY (EGD);  Surgeon: Malissa Hippo, MD;  Location: [facility];  Service: Endoscopy;  Laterality: N/A;  12:00-moved to 11/11 @ 9:55am per Dewayne Hatch   REFRACTIVE SURGERY Bilateral    TOTAL KNEE ARTHROPLASTY Left 05/10/2020   Procedure: TOTAL KNEE ARTHROPLASTY;  Surgeon: Sheral Apley, MD;  Location: [facility];  Service: Orthopedics;  Laterality: Left;    Allergies  Allergies  Allergen Reactions   Z-Pak [Azithromycin] Rash   Zinacef [Cefuroxime] Rash    Home Medications    Prior to Admission medications   Medication Sig Start Date End Date Taking? Authorizing Provider  acetaminophen (TYLENOL) 500 MG tablet Take 2  tablets (1,000 mg total) by mouth every 6 (six) hours as needed for mild pain or moderate pain. Patient taking differently: Take 1,000 mg by mouth 2 (two) times daily as needed for mild pain (pain score 1-3). 05/11/20   Jenne Pane, PA-C  ALPRAZolam Prudy Feeler) 0.5 MG tablet Take 0.25-0.5 mg by mouth at bedtime as needed for anxiety or sleep.     [provider]  carboxymethylcellulose (REFRESH PLUS) 0.5 % SOLN Place 1 drop into both eyes daily.     [provider]  Cholecalciferol (VITAMIN D3) 1000 units CAPS Take 1,000 Units by mouth daily.     [provider]  diltiazem (TIAZAC) 300 MG 24 hr capsule Take 1 capsule (300 mg total) by mouth daily. 09/12/22   Fenton, Clint R, PA  dofetilide (TIKOSYN) 500 MCG capsule TAKE ONE CAPSULE (500 MCG TOTAL) BY MOUTH TWO TIMES DAILY. 08/27/22   Fenton, Clint R, PA  furosemide (LASIX) 20 MG tablet Take 1 tablet (20 mg total) by mouth daily as needed. 12/12/21 12/12/22  Marinus Maw, MD  metFORMIN (GLUCOPHAGE) 500 MG tablet Take 250 mg by mouth 2 (two) times daily.    [provider]  olmesartan (BENICAR) 20 MG tablet Take 20 mg by mouth daily. 03/01/21   [provider]  Omega-3 Fatty Acids (FISH OIL) 1000 MG CAPS Take 1,000 mg by mouth daily.    [provider]  potassium chloride (KLOR-CON) 10 MEQ tablet TAKE ONE TABLET ( TOTAL) BY MOUTH DAILY 09/12/22   Wendall Stade, MD  rosuvastatin (CRESTOR) 10 MG tablet Take 10 mg by mouth daily. Patient not taking: Reported on 09/12/2022 09/05/22   [provider]  trolamine salicylate (ASPERCREME) 10 % cream Apply 1 Application topically at bedtime as needed (hand pain).    [provider]  XARELTO 20 MG TABS tablet TAKE ONE (1) TABLET BY MOUTH EVERY DAY WITH SUPPER 09/20/22   Wendall Stade, MD    Physical Exam    Vital Signs:  Patricia Sharp does not have vital signs available for review today.  Given telephonic nature of communication, physical exam is limited. AAOx3. NAD. Normal affect.  Speech and respirations are unlabored.  Accessory Clinical Findings    None  Assessment & Plan    1.  Preoperative Cardiovascular Risk Assessment:. According to the Revised Cardiac Risk Index (RCRI), her Perioperative Risk of Major Cardiac Event is (%): 0.4  Her Functional Capacity in METs is: 8.97 according to the [facility] Activity Status Index (DASI).   Per office protocol, patient can hold Xarelto  for 2 days prior to procedure.   The patient was advised that if she develops new symptoms prior to surgery to contact our office to arrange for a follow-up visit, and she verbalized understanding.   Therefore, based on ACC/AHA guidelines, patient would be at acceptable risk for the planned procedure without further cardiovascular testing. I will route this recommendation to the requesting party via Epic fax function.   A copy of this note will be routed to requesting surgeon.   Time:   Today, I have spent 10 minutes with the patient with telehealth technology discussing medical history, symptoms, and management plan.     Joni Reining, NP   03/08/2023, 9:44 AM

## 2023-03-08 ENCOUNTER — Ambulatory Visit: Payer: Medicare PPO | Attending: Cardiology

## 2023-03-08 DIAGNOSIS — Z01810 Encounter for preprocedural cardiovascular examination: Secondary | ICD-10-CM | POA: Diagnosis not present

## 2023-03-08 DIAGNOSIS — Z01818 Encounter for other preprocedural examination: Secondary | ICD-10-CM

## 2023-03-11 NOTE — Telephone Encounter (Signed)
 Pt had a telephone visit on 03/08/23. Please advise. Thank you

## 2023-03-13 ENCOUNTER — Encounter (HOSPITAL_COMMUNITY): Payer: Self-pay | Admitting: Physician Assistant

## 2023-03-13 ENCOUNTER — Ambulatory Visit (HOSPITAL_COMMUNITY)
Admission: RE | Admit: 2023-03-13 | Discharge: 2023-03-13 | Disposition: A | Discharge status: Home / Self Care | Payer: Medicare PPO | Source: Ambulatory Visit | Attending: Physician Assistant | Admitting: Physician Assistant

## 2023-03-13 VITALS — BP 160/70 | HR 60 | Ht 61.0 in | Wt 169.2 lb

## 2023-03-13 DIAGNOSIS — Z7984 Long term (current) use of oral hypoglycemic drugs: Secondary | ICD-10-CM | POA: Insufficient documentation

## 2023-03-13 DIAGNOSIS — Z7901 Long term (current) use of anticoagulants: Secondary | ICD-10-CM | POA: Diagnosis not present

## 2023-03-13 DIAGNOSIS — Z5181 Encounter for therapeutic drug level monitoring: Secondary | ICD-10-CM | POA: Diagnosis not present

## 2023-03-13 DIAGNOSIS — Z79899 Other long term (current) drug therapy: Secondary | ICD-10-CM | POA: Diagnosis not present

## 2023-03-13 DIAGNOSIS — E119 Type 2 diabetes mellitus without complications: Secondary | ICD-10-CM | POA: Insufficient documentation

## 2023-03-13 DIAGNOSIS — D6869 Other thrombophilia: Secondary | ICD-10-CM | POA: Insufficient documentation

## 2023-03-13 DIAGNOSIS — I10 Essential (primary) hypertension: Secondary | ICD-10-CM | POA: Diagnosis not present

## 2023-03-13 DIAGNOSIS — I4819 Other persistent atrial fibrillation: Secondary | ICD-10-CM | POA: Diagnosis not present

## 2023-03-13 LAB — BASIC METABOLIC PANEL
Anion gap: 13 (ref 5–15)
BUN: 16 mg/dL (ref 8–23)
CO2: 24 mmol/L (ref 22–32)
Calcium: 9.1 mg/dL (ref 8.9–10.3)
Chloride: 100 mmol/L (ref 98–111)
Creatinine, Ser: 0.96 mg/dL (ref 0.44–1.00)
GFR, Estimated: >60 mL/min (ref >60)
Glucose, Bld: 260 mg/dL — ABNORMAL HIGH (ref 70–99)
Potassium: 3.7 mmol/L (ref 3.5–5.1)
Sodium: 137 mmol/L (ref 135–145)

## 2023-03-13 LAB — MAGNESIUM: Magnesium: 1.8 mg/dL (ref 1.7–2.4)

## 2023-03-13 NOTE — Telephone Encounter (Signed)
 Reviewed. Patient  can hold Xarelto for 2 days prior to procedure. Proceed with scheduling.

## 2023-03-13 NOTE — Progress Notes (Signed)
 Primary Care Physician: Assunta Found, MD Primary Cardiologist: Dr Eden Emms Primary Electrophysiologist: Dr Ladona Ridgel Referring Physician: Dr Netty Starring is a 73 y.o. female with a history of HTN, DM, atrial fibrillation who presents for follow up in the [facility]. Patient is on Xarelto for a CHADS2VASC score of 4. She underwent DCCV 05/09/21 but had early return of afib. She was seen by Dr Ladona Ridgel who recommended dofetilide for rhythm control. Patient is s/p dofetilide admission 7/17-7/20/23. She converted with the medication and did not require DCCV.   Patient returns for follow up for atrial fibrillation and dofetilide monitoring. She reports that she has done well from a cardiac standpoint. She has not had any interim symptoms of afib. She recently did a cologuard which was positive and is getting worked up for a colonoscopy. No bleeding issues on anticoagulation.   Today, he denies symptoms of palpitations, chest pain, shortness of breath, orthopnea, PND, lower extremity edema, dizziness, presyncope, syncope, snoring, daytime somnolence, bleeding, or neurologic sequela. The patient is tolerating medications without difficulties and is otherwise without complaint today.    Atrial Fibrillation Risk Factors:  she does not have symptoms or diagnosis of sleep apnea. she does not have a history of rheumatic fever.   Atrial Fibrillation Management history:  Previous antiarrhythmic drugs: dofetilide  Previous cardioversions: 05/09/21 Previous ablations: none Anticoagulation history: Xarelto   Past Medical History:  Diagnosis Date   Anemia    Anxiety    Arthritis    Atrial fibrillation (HCC) 03/2021   Depression    Diabetes mellitus without complication (HCC)    Dysrhythmia    Essential hypertension, benign 10/29/2017   Hypertension     Current Outpatient Medications  Medication Sig Dispense Refill   acetaminophen (TYLENOL) 500 MG tablet  Take 2 tablets (1,000 mg total) by mouth every 6 (six) hours as needed for mild pain or moderate pain. (Patient taking differently: Take 1,000 mg by mouth 2 (two) times daily as needed for mild pain (pain score 1-3).) 60 tablet 0   ALPRAZolam (XANAX) 0.5 MG tablet Take 0.25-0.5 mg by mouth at bedtime as needed for anxiety or sleep.      carboxymethylcellulose (REFRESH PLUS) 0.5 % SOLN Place 1 drop into both eyes daily.     Cholecalciferol (VITAMIN D3) 1000 units CAPS Take 1,000 Units by mouth daily.      diltiazem (TIAZAC) 300 MG 24 hr capsule Take 1 capsule (300 mg total) by mouth daily. 30 capsule 6   dofetilide (TIKOSYN) 500 MCG capsule TAKE ONE CAPSULE (500 MCG TOTAL) BY MOUTH TWO TIMES DAILY. 180 capsule 2   furosemide (LASIX) 20 MG tablet Take 1 tablet (20 mg total) by mouth daily as needed. 90 tablet 3   metFORMIN (GLUCOPHAGE) 500 MG tablet Take 250 mg by mouth 2 (two) times daily.     olmesartan (BENICAR) 20 MG tablet Take 20 mg by mouth in the morning and at bedtime.     Omega-3 Fatty Acids (FISH OIL) 1000 MG CAPS Take 1,000 mg by mouth daily.     potassium chloride (KLOR-CON) 10 MEQ tablet TAKE ONE TABLET ( TOTAL) BY MOUTH DAILY 90 tablet 1   rosuvastatin (CRESTOR) 10 MG tablet Take 10 mg by mouth daily.     trolamine salicylate (ASPERCREME) 10 % cream Apply 1 Application topically at bedtime as needed (hand pain).     XARELTO 20 MG TABS tablet TAKE ONE (1) TABLET BY MOUTH EVERY DAY  WITH SUPPER 30 tablet 5   No current facility-administered medications for this encounter.   ROS- All systems are reviewed and negative except as per the HPI above.  Physical Exam: Vitals:   03/13/23 0940  BP: (!) 160/70  Pulse: 60  Weight: 76.7 kg  Height: 5\' 1"  (1.549 m)    GEN: Well nourished, well developed in no acute distress CARDIAC: Regular rate and rhythm, no murmurs, rubs, gallops RESPIRATORY:  Clear to auscultation without rales, wheezing or rhonchi  ABDOMEN: Soft, non-tender,  non-distended EXTREMITIES:  No edema; No deformity    Wt Readings from Last 3 Encounters:  03/13/23 76.7 kg  02/21/23 76.1 kg  09/12/22 77.9 kg    EKG today demonstrates  SR, NST Vent. rate 60 BPM PR interval 138 ms QRS duration 84 ms QT/QTcB 428/428 ms   Echo 04/14/21 demonstrated   1. Left ventricular ejection fraction, by estimation, is 60 to 65%. The  left ventricle has normal function. The left ventricle has no regional  wall motion abnormalities. There is mild left ventricular hypertrophy.  Left ventricular diastolic parameters are indeterminate.   2. Right ventricular systolic function is normal. The right ventricular  size is normal. Tricuspid regurgitation signal is inadequate for assessing PA pressure.   3. Left atrial size was severely dilated.   4. Right atrial size was mildly dilated.   5. The mitral valve is normal in structure. Trivial mitral valve  regurgitation. No evidence of mitral stenosis.   6. The aortic valve is tricuspid. Aortic valve regurgitation is mild. No  aortic stenosis is present.   7. The inferior vena cava is normal in size with greater than 50%  respiratory variability, suggesting right atrial pressure of 3 mmHg.   Epic records are reviewed at length today  CHA2DS2-VASc Score = 4  The patient's score is based upon: CHF History: 0 HTN History: 1 Diabetes History: 1 Stroke History: 0 Vascular Disease History: 0 Age Score: 1 Gender Score: 1       ASSESSMENT AND PLAN: Persistent Atrial Fibrillation (ICD10:  I48.19) The patient's CHA2DS2-VASc score is 4, indicating a 4.8% annual risk of stroke.   S/p dofetilide loading 07/2021 Patient appears to be maintaining SR. Continue dofetilide 500 mcg BID Continue Xarelto 20 mg daily Continue diltiazem 300 mg daily  Secondary Hypercoagulable State (ICD10:  D68.69) The patient is at significant risk for stroke/thromboembolism based upon her CHA2DS2-VASc Score of 4.  Continue Rivaroxaban  (Xarelto). No bleeding issues.  High Risk Medication Monitoring (ICD 10: Z79.899) QT interval on ECG acceptable for dofetilide monitoring. Check bmet/mag   HTN Elevated today, olmesartan just increased two days ago Continue follow up with PCP.    Follow up with Dr Ladona Ridgel per recall. AF clinic in one year.    Jorja Loa PA-C [facility] [address] [place], [place] [address] [phone] 03/13/2023 9:45 AM

## 2023-03-14 ENCOUNTER — Encounter: Payer: Self-pay | Admitting: *Deleted

## 2023-03-14 ENCOUNTER — Other Ambulatory Visit: Payer: Self-pay | Admitting: *Deleted

## 2023-03-14 MED ORDER — PEG 3350-KCL-NA BICARB-NACL 420 G PO SOLR: 4000.0000 mL | Freq: Once | ORAL | 0 refills | Status: AC

## 2023-03-14 NOTE — Telephone Encounter (Signed)
 Attempted to call pt, unable to leave message due to voicemail not being set up

## 2023-03-14 NOTE — Telephone Encounter (Signed)
 Pt has been scheduled for 05/15/23. Instructions mailed and prep sent to the pharmacy.  [employer] PA:  Approved Authorization #[id number]  Tracking #[id number] Dates of service 05/15/2023 - 08/14/2023

## 2023-03-14 NOTE — Telephone Encounter (Signed)
 Spoke to pt and gave her the only date available for procedure which in 4/30. She says she will have check with her daughter and call back

## 2023-03-15 ENCOUNTER — Other Ambulatory Visit (HOSPITAL_COMMUNITY): Payer: Self-pay | Admitting: *Deleted

## 2023-03-15 DIAGNOSIS — I4819 Other persistent atrial fibrillation: Secondary | ICD-10-CM

## 2023-03-15 MED ORDER — MAGNESIUM OXIDE 400 MG PO TABS: 400.0000 mg | ORAL_TABLET | Freq: Every day | ORAL | 1 refills | Status: DC

## 2023-03-15 MED ORDER — POTASSIUM CHLORIDE ER 10 MEQ PO TBCR: 20.0000 meq | EXTENDED_RELEASE_TABLET | Freq: Every day | ORAL | 1 refills | Status: DC

## 2023-03-21 DIAGNOSIS — J101 Influenza due to other identified influenza virus with other respiratory manifestations: Secondary | ICD-10-CM | POA: Diagnosis not present

## 2023-03-21 DIAGNOSIS — Z20828 Contact with and (suspected) exposure to other viral communicable diseases: Secondary | ICD-10-CM | POA: Diagnosis not present

## 2023-03-21 DIAGNOSIS — E6609 Other obesity due to excess calories: Secondary | ICD-10-CM | POA: Diagnosis not present

## 2023-03-21 DIAGNOSIS — Z6832 Body mass index (BMI) 32.0-32.9, adult: Secondary | ICD-10-CM | POA: Diagnosis not present

## 2023-04-01 ENCOUNTER — Other Ambulatory Visit (HOSPITAL_COMMUNITY): Payer: Self-pay | Admitting: Physician Assistant

## 2023-04-03 ENCOUNTER — Other Ambulatory Visit (HOSPITAL_COMMUNITY): Payer: Self-pay | Admitting: Physician Assistant

## 2023-04-03 DIAGNOSIS — I4819 Other persistent atrial fibrillation: Secondary | ICD-10-CM | POA: Diagnosis not present

## 2023-04-04 LAB — BASIC METABOLIC PANEL
BUN/Creatinine Ratio: 17 (ref 12–28)
BUN: 18 mg/dL (ref 8–27)
CO2: 20 mmol/L (ref 20–29)
Calcium: 9 mg/dL (ref 8.7–10.3)
Chloride: 100 mmol/L (ref 96–106)
Creatinine, Ser: 1.03 mg/dL — ABNORMAL HIGH (ref 0.57–1.00)
Glucose: 246 mg/dL — ABNORMAL HIGH (ref 70–99)
Potassium: 4.4 mmol/L (ref 3.5–5.2)
Sodium: 140 mmol/L (ref 134–144)
eGFR: 57 mL/min/{1.73_m2} — ABNORMAL LOW (ref >59)

## 2023-04-04 LAB — MAGNESIUM: Magnesium: 1.8 mg/dL (ref 1.6–2.3)

## 2023-04-24 DIAGNOSIS — H401131 Primary open-angle glaucoma, bilateral, mild stage: Secondary | ICD-10-CM | POA: Diagnosis not present

## 2023-04-24 DIAGNOSIS — H35033 Hypertensive retinopathy, bilateral: Secondary | ICD-10-CM | POA: Diagnosis not present

## 2023-04-24 DIAGNOSIS — E119 Type 2 diabetes mellitus without complications: Secondary | ICD-10-CM | POA: Diagnosis not present

## 2023-04-24 DIAGNOSIS — H2513 Age-related nuclear cataract, bilateral: Secondary | ICD-10-CM | POA: Diagnosis not present

## 2023-04-26 ENCOUNTER — Other Ambulatory Visit (HOSPITAL_COMMUNITY): Payer: Self-pay | Admitting: *Deleted

## 2023-04-26 MED ORDER — POTASSIUM CHLORIDE ER 10 MEQ PO TBCR: 20.0000 meq | EXTENDED_RELEASE_TABLET | Freq: Every day | ORAL | 2 refills | Status: DC

## 2023-05-06 ENCOUNTER — Other Ambulatory Visit: Payer: Self-pay | Admitting: *Deleted

## 2023-05-10 NOTE — Patient Instructions (Signed)
 Patricia Sharp  05/10/2023     @PREFPERIOPPHARMACY @   Your procedure is scheduled on  05/15/2023.   Report to [facility] at  0600 A.M.   Call this number if you have problems the morning of surgery:  [phone]  If you experience any cold or flu symptoms such as cough, fever, chills, shortness of breath, etc. between now and your scheduled surgery, please notify us  at the above number.   Remember:        Your last dose of Xarelto  should be on 05/12/2023.        DO NOT take any medications for diabetes the morning of your procedure.   Follow the diet and prep instructions given to you by the office.    You may drink clear liquids until  0330 am on 05/15/2023.   Clear liquids allowed are:                    Water , Juice (No red color; non-citric and without pulp; diabetics please choose diet or no sugar options), Carbonated beverages (diabetics please choose diet or no sugar options), Clear Tea (No creamer, milk, or cream, including half & half and powdered creamer), Black Coffee Only (No creamer, milk or cream, including half & half and powdered creamer), and Clear Sports drink (No red color; diabetics please choose diet or no sugar options)    Take these medicines the morning of surgery with A SIP OF WATER                          alprazolam  (if needed), diltiazem , tikosyn .    Do not wear jewelry, make-up or nail polish, including gel polish,  artificial nails, or any other type of covering on natural nails (fingers and  toes).  Do not wear lotions, powders, or perfumes, or deodorant.  Do not shave 48 hours prior to surgery.  Men may shave face and neck.  Do not bring valuables to the hospital.  [facility] is not responsible for any belongings or valuables.  Contacts, dentures or bridgework may not be worn into surgery.  Leave your suitcase in the car.  After surgery it may be brought to your room.  For patients admitted to the hospital, discharge time will be  determined by your treatment team.  Patients discharged the day of surgery will not be allowed to drive home and must have someone with them for 24 hours.    Special instructions:   DO NOT smoke tobacco or vape for 24 hors before your procedure.  Please read over the following fact sheets that you were given. Anesthesia Post-op Instructions and Care and Recovery After Surgery      Colonoscopy, Adult, Care After The following information offers guidance on how to care for yourself after your procedure. Your health care provider may also give you more specific instructions. If you have problems or questions, contact your health care provider. What can I expect after the procedure? After the procedure, it is common to have: A small amount of blood in your stool for 24 hours after the procedure. Some gas. Mild cramping or bloating of your abdomen. Follow these instructions at home: Eating and drinking  Drink enough fluid to keep your urine pale yellow. Follow instructions from your health care provider about eating or drinking restrictions. Resume your normal diet as told by your health care provider. Avoid heavy or fried foods that  are hard to digest. Activity Rest as told by your health care provider. Avoid sitting for a long time without moving. Get up to take short walks every 1-2 hours. This is important to improve blood flow and breathing. Ask for help if you feel weak or unsteady. Return to your normal activities as told by your health care provider. Ask your health care provider what activities are safe for you. Managing cramping and bloating  Try walking around when you have cramps or feel bloated. If directed, apply heat to your abdomen as told by your health care provider. Use the heat source that your health care provider recommends, such as a moist heat pack or a heating pad. Place a towel between your skin and the heat source. Leave the heat on for 20-30 minutes. Remove  the heat if your skin turns bright red. This is especially important if you are unable to feel pain, heat, or cold. You have a greater risk of getting burned. General instructions If you were given a sedative during the procedure, it can affect you for several hours. Do not drive or operate machinery until your health care provider says that it is safe. For the first 24 hours after the procedure: Do not sign important documents. Do not drink alcohol . Do your regular daily activities at a slower pace than normal. Eat soft foods that are easy to digest. Take over-the-counter and prescription medicines only as told by your health care provider. Keep all follow-up visits. This is important. Contact a health care provider if: You have blood in your stool 2-3 days after the procedure. Get help right away if: You have more than a small spotting of blood in your stool. You have large blood clots in your stool. You have swelling of your abdomen. You have nausea or vomiting. You have a fever. You have increasing pain in your abdomen that is not relieved with medicine. These symptoms may be an emergency. Get help right away. Call 911. Do not wait to see if the symptoms will go away. Do not drive yourself to the hospital. Summary After the procedure, it is common to have a small amount of blood in your stool. You may also have mild cramping and bloating of your abdomen. If you were given a sedative during the procedure, it can affect you for several hours. Do not drive or operate machinery until your health care provider says that it is safe. Get help right away if you have a lot of blood in your stool, nausea or vomiting, a fever, or increased pain in your abdomen. This information is not intended to replace advice given to you by your health care provider. Make sure you discuss any questions you have with your health care provider. Document Revised: 02/13/2022 Document Reviewed: 08/24/2020 Elsevier  Patient Education  2024 [employer]General Anesthesia, Adult, Care After The following information offers guidance on how to care for yourself after your procedure. Your health care provider may also give you more specific instructions. If you have problems or questions, contact your health care provider. What can I expect after the procedure? After the procedure, it is common for people to: Have pain or discomfort at the IV site. Have nausea or vomiting. Have a sore throat or hoarseness. Have trouble concentrating. Feel cold or chills. Feel weak, sleepy, or tired (fatigue). Have soreness and body aches. These can affect parts of the body that were not involved in surgery. Follow these instructions at home: For the time  period you were told by your health care provider:  Rest. Do not participate in activities where you could fall or become injured. Do not drive or use machinery. Do not drink alcohol . Do not take sleeping pills or medicines that cause drowsiness. Do not make important decisions or sign legal documents. Do not take care of children on your own. General instructions Drink enough fluid to keep your urine pale yellow. If you have sleep apnea, surgery and certain medicines can increase your risk for breathing problems. Follow instructions from your health care provider about wearing your sleep device: Anytime you are sleeping, including during daytime naps. While taking prescription pain medicines, sleeping medicines, or medicines that make you drowsy. Return to your normal activities as told by your health care provider. Ask your health care provider what activities are safe for you. Take over-the-counter and prescription medicines only as told by your health care provider. Do not use any products that contain nicotine or tobacco. These products include cigarettes, chewing tobacco, and vaping devices, such as e-cigarettes. These can delay incision healing after surgery. If  you need help quitting, ask your health care provider. Contact a health care provider if: You have nausea or vomiting that does not get better with medicine. You vomit every time you eat or drink. You have pain that does not get better with medicine. You cannot urinate or have bloody urine. You develop a skin rash. You have a fever. Get help right away if: You have trouble breathing. You have chest pain. You vomit blood. These symptoms may be an emergency. Get help right away. Call 911. Do not wait to see if the symptoms will go away. Do not drive yourself to the hospital. Summary After the procedure, it is common to have a sore throat, hoarseness, nausea, vomiting, or to feel weak, sleepy, or fatigue. For the time period you were told by your health care provider, do not drive or use machinery. Get help right away if you have difficulty breathing, have chest pain, or vomit blood. These symptoms may be an emergency. This information is not intended to replace advice given to you by your health care provider. Make sure you discuss any questions you have with your health care provider. Document Revised: 03/31/2021 Document Reviewed: 03/31/2021 Elsevier Patient Education  2024 [employer].

## 2023-05-13 ENCOUNTER — Other Ambulatory Visit: Payer: Self-pay

## 2023-05-13 ENCOUNTER — Encounter (HOSPITAL_COMMUNITY): Payer: Self-pay

## 2023-05-13 ENCOUNTER — Encounter (HOSPITAL_COMMUNITY)
Admission: RE | Admit: 2023-05-13 | Discharge: 2023-05-13 | Disposition: A | Discharge status: Home / Self Care | Payer: Medicare PPO | Source: Ambulatory Visit | Attending: Internal Medicine | Admitting: Internal Medicine

## 2023-05-13 VITALS — BP 164/58 | HR 55 | Temp 98.0°F | Resp 18 | Ht 61.0 in | Wt 169.1 lb

## 2023-05-13 DIAGNOSIS — Z01818 Encounter for other preprocedural examination: Secondary | ICD-10-CM | POA: Diagnosis present

## 2023-05-13 DIAGNOSIS — Z01812 Encounter for preprocedural laboratory examination: Secondary | ICD-10-CM | POA: Diagnosis not present

## 2023-05-13 DIAGNOSIS — E119 Type 2 diabetes mellitus without complications: Secondary | ICD-10-CM | POA: Insufficient documentation

## 2023-05-13 DIAGNOSIS — D649 Anemia, unspecified: Secondary | ICD-10-CM | POA: Diagnosis not present

## 2023-05-13 LAB — CBC WITH DIFFERENTIAL/PLATELET
Abs Immature Granulocytes: 0.03 10*3/uL (ref 0.00–0.07)
Basophils Absolute: 0 10*3/uL (ref 0.0–0.1)
Basophils Relative: 0 %
Eosinophils Absolute: 0.1 10*3/uL (ref 0.0–0.5)
Eosinophils Relative: 1 %
HCT: 40.6 % (ref 36.0–46.0)
Hemoglobin: 13.2 g/dL (ref 12.0–15.0)
Immature Granulocytes: 0 %
Lymphocytes Relative: 11 %
Lymphs Abs: 1 10*3/uL (ref 0.7–4.0)
MCH: 29.4 pg (ref 26.0–34.0)
MCHC: 32.5 g/dL (ref 30.0–36.0)
MCV: 90.4 fL (ref 80.0–100.0)
Monocytes Absolute: 0.6 10*3/uL (ref 0.1–1.0)
Monocytes Relative: 6 %
Neutro Abs: 7.8 10*3/uL — ABNORMAL HIGH (ref 1.7–7.7)
Neutrophils Relative %: 82 %
Platelets: 257 10*3/uL (ref 150–400)
RBC: 4.49 MIL/uL (ref 3.87–5.11)
RDW: 13.7 % (ref 11.5–15.5)
WBC: 9.5 10*3/uL (ref 4.0–10.5)
nRBC: 0 % (ref 0.0–0.2)

## 2023-05-13 LAB — BASIC METABOLIC PANEL WITH GFR
Anion gap: 9 (ref 5–15)
BUN: 28 mg/dL — ABNORMAL HIGH (ref 8–23)
CO2: 24 mmol/L (ref 22–32)
Calcium: 9.2 mg/dL (ref 8.9–10.3)
Chloride: 101 mmol/L (ref 98–111)
Creatinine, Ser: 1.05 mg/dL — ABNORMAL HIGH (ref 0.44–1.00)
GFR, Estimated: 56 mL/min — ABNORMAL LOW (ref >60)
Glucose, Bld: 121 mg/dL — ABNORMAL HIGH (ref 70–99)
Potassium: 4 mmol/L (ref 3.5–5.1)
Sodium: 134 mmol/L — ABNORMAL LOW (ref 135–145)

## 2023-05-14 NOTE — Anesthesia Preprocedure Evaluation (Signed)
 Anesthesia Evaluation  Patient identified by MRN, date of birth, ID band Patient awake    Reviewed: Allergy & Precautions, NPO status , Patient's Chart, lab work & pertinent test results  Airway Mallampati: III  TM Distance: >3 FB Neck ROM: Full  Mouth opening: Limited Mouth Opening  Dental no notable dental hx. (+) Dental Advisory Given, Teeth Intact   Pulmonary neg pulmonary ROS   Pulmonary exam normal breath sounds clear to auscultation       Cardiovascular Exercise Tolerance: Good hypertension, Pt. on medications Normal cardiovascular exam+ dysrhythmias Atrial Fibrillation  Rhythm:Irregular Rate:Normal  1. Left ventricular ejection fraction, by estimation, is 60 to 65%. The left ventricle has normal function. The left ventricle has no regional wall motion abnormalities. There is mild left ventricular hypertrophy.  Left ventricular diastolic parameters are indeterminate.  2. Right ventricular systolic function is normal. The right ventricular size is normal. Tricuspid regurgitation signal is inadequate for assessing PA pressure.  3. Left atrial size was severely dilated.  4. Right atrial size was mildly dilated.  5. The mitral valve is normal in structure. Trivial mitral valve regurgitation. No evidence of mitral stenosis.  6. The aortic valve is tricuspid. Aortic valve regurgitation is mild. No aortic stenosis is present.  7. The inferior vena cava is normal in size with greater than 50% respiratory variability, suggesting right atrial pressure of 3 mmHg.    Neuro/Psych  PSYCHIATRIC DISORDERS Anxiety Depression    negative neurological ROS     GI/Hepatic negative GI ROS, Neg liver ROS,,,  Endo/Other  diabetes, Well Controlled, Type 2, Oral Hypoglycemic Agents    Renal/GU negative Renal ROS  negative genitourinary   Musculoskeletal  (+) Arthritis , Osteoarthritis,    Abdominal   Peds negative pediatric ROS (+)   Hematology  (+) Blood dyscrasia, anemia   Anesthesia Other Findings   Reproductive/Obstetrics negative OB ROS                             Anesthesia Physical Anesthesia Plan  ASA: 3  Anesthesia Plan: General   Post-op Pain Management: Minimal or no pain anticipated   Induction: Intravenous  PONV Risk Score and Plan: Propofol  infusion  Airway Management Planned: Nasal Cannula and Natural Airway  Additional Equipment: None  Intra-op Plan:   Post-operative Plan:   Informed Consent: I have reviewed the patients History and Physical, chart, labs and discussed the procedure including the risks, benefits and alternatives for the proposed anesthesia with the patient or authorized representative who has indicated his/her understanding and acceptance.     Dental advisory given  Plan Discussed with: CRNA  Anesthesia Plan Comments:         Anesthesia Quick Evaluation

## 2023-05-15 ENCOUNTER — Encounter (HOSPITAL_COMMUNITY): Payer: Self-pay | Admitting: Internal Medicine

## 2023-05-15 ENCOUNTER — Other Ambulatory Visit: Payer: Self-pay

## 2023-05-15 ENCOUNTER — Ambulatory Visit (HOSPITAL_COMMUNITY): Payer: Self-pay | Admitting: Anesthesiology

## 2023-05-15 ENCOUNTER — Encounter (HOSPITAL_COMMUNITY)
Admission: RE | Disposition: A | Discharge status: Home / Self Care | Payer: Self-pay | Source: Home / Self Care | Attending: Internal Medicine

## 2023-05-15 ENCOUNTER — Ambulatory Visit (HOSPITAL_COMMUNITY)
Admission: RE | Admit: 2023-05-15 | Discharge: 2023-05-15 | Disposition: A | Discharge status: Home / Self Care | Payer: Medicare PPO | Attending: Internal Medicine | Admitting: Internal Medicine

## 2023-05-15 ENCOUNTER — Ambulatory Visit (HOSPITAL_BASED_OUTPATIENT_CLINIC_OR_DEPARTMENT_OTHER): Payer: Self-pay | Admitting: Anesthesiology

## 2023-05-15 DIAGNOSIS — D6869 Other thrombophilia: Secondary | ICD-10-CM | POA: Diagnosis not present

## 2023-05-15 DIAGNOSIS — K635 Polyp of colon: Secondary | ICD-10-CM | POA: Diagnosis not present

## 2023-05-15 DIAGNOSIS — I4891 Unspecified atrial fibrillation: Secondary | ICD-10-CM | POA: Diagnosis not present

## 2023-05-15 DIAGNOSIS — Z79899 Other long term (current) drug therapy: Secondary | ICD-10-CM | POA: Diagnosis not present

## 2023-05-15 DIAGNOSIS — M1990 Unspecified osteoarthritis, unspecified site: Secondary | ICD-10-CM | POA: Diagnosis not present

## 2023-05-15 DIAGNOSIS — I119 Hypertensive heart disease without heart failure: Secondary | ICD-10-CM | POA: Insufficient documentation

## 2023-05-15 DIAGNOSIS — Z7901 Long term (current) use of anticoagulants: Secondary | ICD-10-CM | POA: Insufficient documentation

## 2023-05-15 DIAGNOSIS — D123 Benign neoplasm of transverse colon: Secondary | ICD-10-CM

## 2023-05-15 DIAGNOSIS — K5730 Diverticulosis of large intestine without perforation or abscess without bleeding: Secondary | ICD-10-CM

## 2023-05-15 DIAGNOSIS — R195 Other fecal abnormalities: Secondary | ICD-10-CM | POA: Insufficient documentation

## 2023-05-15 DIAGNOSIS — E119 Type 2 diabetes mellitus without complications: Secondary | ICD-10-CM | POA: Insufficient documentation

## 2023-05-15 DIAGNOSIS — F419 Anxiety disorder, unspecified: Secondary | ICD-10-CM | POA: Insufficient documentation

## 2023-05-15 DIAGNOSIS — Z1211 Encounter for screening for malignant neoplasm of colon: Secondary | ICD-10-CM | POA: Diagnosis not present

## 2023-05-15 DIAGNOSIS — K641 Second degree hemorrhoids: Secondary | ICD-10-CM | POA: Diagnosis not present

## 2023-05-15 DIAGNOSIS — Z7984 Long term (current) use of oral hypoglycemic drugs: Secondary | ICD-10-CM | POA: Diagnosis not present

## 2023-05-15 DIAGNOSIS — E782 Mixed hyperlipidemia: Secondary | ICD-10-CM | POA: Diagnosis not present

## 2023-05-15 LAB — GLUCOSE, CAPILLARY: Glucose-Capillary: 100 mg/dL — ABNORMAL HIGH (ref 70–99)

## 2023-05-15 SURGERY — COLONOSCOPY WITH PROPOFOL
Anesthesia: General

## 2023-05-15 MED ORDER — LIDOCAINE 2% (20 MG/ML) 5 ML SYRINGE
INTRAMUSCULAR | Status: DC | PRN
  Administered 2023-05-15: 60 mg via INTRAVENOUS

## 2023-05-15 MED ORDER — PROPOFOL 10 MG/ML IV BOLUS
INTRAVENOUS | Status: DC | PRN
  Administered 2023-05-15: 100 ug/kg/min via INTRAVENOUS
  Administered 2023-05-15: 50 mg via INTRAVENOUS

## 2023-05-15 MED ORDER — LACTATED RINGERS IV SOLN: INTRAVENOUS | Status: DC | PRN

## 2023-05-15 MED ORDER — STERILE WATER FOR IRRIGATION IR SOLN
Status: DC | PRN
  Administered 2023-05-15: 100 mL

## 2023-05-15 NOTE — H&P (Signed)
 @LOGO @   Primary Care Physician:  Minus Amel, MD Primary Gastroenterologist:  Dr. Riley Cheadle  Pre-Procedure History & Physical: HPI:  Patricia Sharp is a 73 y.o. female here for colonoscopy.  Positive Cologuard.  Negative colonoscopy (benign polyps) 2014.  Past Medical History:  Diagnosis Date   Anemia    Anxiety    Arthritis    Atrial fibrillation (HCC) 03/2021   Depression    Diabetes mellitus without complication (HCC)    Dysrhythmia    Essential hypertension, benign 10/29/2017   Hypertension     Past Surgical History:  Procedure Laterality Date   ABDOMINAL HYSTERECTOMY     ABDOMINAL SURGERY     to remove fibroid tumors   BIOPSY  11/25/2017   Procedure: BIOPSY;  Surgeon: Ruby Corporal, MD;  Location: [facility];  Service: Endoscopy;;  gastric   CARDIOVERSION N/A 05/09/2021   Procedure: CARDIOVERSION;  Surgeon: Jacqueline Matsu, MD;  Location: [facility];  Service: Cardiovascular;  Laterality: N/A;   CHOLECYSTECTOMY     COLONOSCOPY N/A 01/02/2013   Procedure: COLONOSCOPY;  Surgeon: Ruby Corporal, MD;  Location: [facility];  Service: Endoscopy;  Laterality: N/A;  825   ESOPHAGOGASTRODUODENOSCOPY N/A 11/25/2017   Procedure: ESOPHAGOGASTRODUODENOSCOPY (EGD);  Surgeon: Ruby Corporal, MD;  Location: [facility];  Service: Endoscopy;  Laterality: N/A;  12:00-moved to 11/11 @ 9:55am per Lorelee Roger   REFRACTIVE SURGERY Bilateral    TOTAL KNEE ARTHROPLASTY Left 05/10/2020   Procedure: TOTAL KNEE ARTHROPLASTY;  Surgeon: Saundra Curl, MD;  Location: [facility];  Service: Orthopedics;  Laterality: Left;    Prior to Admission medications   Medication Sig Start Date End Date Taking? Authorizing Provider  acetaminophen  (TYLENOL ) 500 MG tablet Take 2 tablets (1,000 mg total) by mouth every 6 (six) hours as needed for mild pain or moderate pain. Patient taking differently: Take 1,000 mg by mouth 2 (two) times daily as needed for mild pain (pain score 1-3). 05/11/20  Yes Gawne, Meghan M,  PA-C  ALPRAZolam  (XANAX ) 0.5 MG tablet Take 0.25-0.5 mg by mouth at bedtime as needed for anxiety or sleep.    Yes [provider]  carboxymethylcellulose (REFRESH PLUS) 0.5 % SOLN Place 1 drop into both eyes daily.   Yes [provider]  Cholecalciferol (VITAMIN D3) 1000 units CAPS Take 1,000 Units by mouth daily.    Yes [provider]  Coenzyme Q10 (COQ10) 100 MG CAPS Take by mouth.   Yes [provider]  diltiazem  (CARDIZEM  CD) 300 MG 24 hr capsule TAKE ONE CAPSULE (300MG  TOTAL) BY MOUTH DAILY 04/02/23  Yes Fenton, Clint R, PA  dofetilide  (TIKOSYN ) 500 MCG capsule TAKE ONE CAPSULE (500 MCG TOTAL) BY MOUTH TWO TIMES DAILY. 08/27/22  Yes Fenton, Clint R, PA  magnesium  oxide (MAG-OX) 400 MG tablet Take 1 tablet (400 mg total) by mouth daily. 03/15/23  Yes Fenton, Clint R, PA  metFORMIN  (GLUCOPHAGE ) 500 MG tablet Take 250 mg by mouth 2 (two) times daily.   Yes [provider]  olmesartan (BENICAR) 40 MG tablet Take 40 mg by mouth daily. 04/15/23  Yes [provider]  Omega-3 Fatty Acids (FISH OIL) 1000 MG CAPS Take 1,000 mg by mouth daily.   Yes [provider]  potassium chloride  (KLOR-CON ) 10 MEQ tablet Take 2 tablets (20 mEq total) by mouth daily. 04/26/23  Yes Fenton, Clint R, PA  rosuvastatin (CRESTOR) 10 MG tablet Take 10 mg by mouth daily. 09/05/22  Yes [provider]  trolamine salicylate (ASPERCREME) 10 % cream Apply 1 Application topically at bedtime as needed (hand pain).   Yes [provider]  XARELTO  20 MG TABS tablet TAKE ONE (1) TABLET BY MOUTH EVERY DAY WITH SUPPER 09/20/22  Yes Loyde Rule, MD  furosemide  (LASIX ) 20 MG tablet Take 1 tablet (20 mg total) by mouth daily as needed. 12/12/21 03/13/23  Tammie Fall, MD    Allergies as of 03/14/2023 - Review Complete 03/13/2023  Allergen Reaction Noted   Z-pak [azithromycin] Rash 03/08/2014   Zinacef [cefuroxime] Rash     Family History  Problem  Relation Age of Onset   Atrial fibrillation Mother    Colon cancer Neg Hx     Social History   Socioeconomic History   Marital status: Widowed    Spouse name: Not on file   Number of children: Not on file   Years of education: Not on file   Highest education level: Not on file  Occupational History   Not on file  Tobacco Use   Smoking status: Never   Smokeless tobacco: Never   Tobacco comments:    Never smoke 08/10/21  Vaping Use   Vaping status: Never Used  Substance and Sexual Activity   Alcohol  use: No   Drug use: No   Sexual activity: Not on file  Other Topics Concern   Not on file  Social History Narrative   Not on file   Social Drivers of Health   Financial Resource Strain: Not on file  Food Insecurity: Not on file  Transportation Needs: Not on file  Physical Activity: Not on file  Stress: Not on file  Social Connections: Not on file  Intimate Partner Violence: Not on file    Review of Systems: See HPI, otherwise negative ROS  Physical Exam: BP (!) 187/63   Pulse 66   Temp 98.5 F (36.9 C) (Oral)   Resp 20   Ht 5\' 1"  (1.549 m)   Wt 74.8 kg   SpO2 98%   BMI 31.18 kg/m  General:   Alert,  Well-developed, well-nourished, pleasant and cooperative in NAD Neck:  Supple; no masses or thyromegaly. No significant cervical adenopathy. Lungs:  Clear throughout to auscultation.   No wheezes, crackles, or rhonchi. No acute distress. Heart:  Regular rate and rhythm; no murmurs, clicks, rubs,  or gallops. Abdomen: Non-distended, normal bowel sounds.  Soft and nontender without appreciable mass or hepatosplenomegaly.  Pulses:  Normal pulses noted. Extremities:  Without clubbing or edema.  Impression/Plan: 73 year old female here for colonoscopy due to a positive Cologuard.  The risks, benefits, limitations, alternatives and imponderables have been reviewed with the patient. Questions have been answered. All parties are agreeable.       Notice: This dictation  was prepared with Dragon dictation along with smaller phrase technology. Any transcriptional errors that result from this process are unintentional and may not be corrected upon review.

## 2023-05-15 NOTE — Transfer of Care (Signed)
 Immediate Anesthesia Transfer of Care Note  Patient: Patricia Sharp  Procedure(s) Performed: COLONOSCOPY WITH PROPOFOL   Patient Location: PACU  Anesthesia Type:General  Level of Consciousness: awake, alert , oriented, and patient cooperative  Airway & Oxygen Therapy: Patient Spontanous Breathing  Post-op Assessment: Report given to RN, Post -op Vital signs reviewed and stable, and Patient moving all extremities X 4  Post vital signs: Reviewed and stable  Last Vitals:  Vitals Value Taken Time  BP 139/47   Temp 97.7   Pulse 57   Resp 13   SpO2 97     Last Pain:  Vitals:   05/15/23 0732  TempSrc:   PainSc: 0-No pain         Complications: No notable events documented.

## 2023-05-15 NOTE — Op Note (Signed)
 [facility] Patient Name: Patricia Sharp Procedure Date: 05/15/2023 7:04 AM MRN: [id number] Date of Birth: 15-Aug-1950 Attending MD: Gemma Kelp , MD, [id number] CSN: [id number] Age: 73 Admit Type: Outpatient Procedure:                Colonoscopy Indications:              Positive Cologuard test Providers:                Gemma Kelp, MD, Vonna Guardian, [place] Wilson,                            Technician, Theola Fitch Referring MD:              Medicines:                Propofol  per Anesthesia Complications:            No immediate complications. Estimated Blood Loss:     Estimated blood loss was minimal. Procedure:                Pre-Anesthesia Assessment:                           - Prior to the procedure, a History and Physical                            was performed, and patient medications and                            allergies were reviewed. The patient's tolerance of                            previous anesthesia was also reviewed. The risks                            and benefits of the procedure and the sedation                            options and risks were discussed with the patient.                            All questions were answered, and informed consent                            was obtained. Prior Anticoagulants: The patient has                            taken no anticoagulant or antiplatelet agents. ASA                            Grade Assessment: III - A patient with severe                            systemic disease. After reviewing the risks and  benefits, the patient was deemed in satisfactory                            condition to undergo the procedure.                           After obtaining informed consent, the colonoscope                            was passed under direct vision. Throughout the                            procedure, the patient's blood pressure, pulse, and                            oxygen  saturations were monitored continuously. The                            [id number]) scope was introduced through the                            anus and advanced to the the cecum, identified by                            appendiceal orifice and ileocecal valve. The                            colonoscopy was performed without difficulty. The                            patient tolerated the procedure well. The quality                            of the bowel preparation was adequate. The                            ileocecal valve, appendiceal orifice, and rectum                            were photographed. The entire colon was well                            visualized. Scope In: 7:35:35 AM Scope Out: 7:54:05 AM Scope Withdrawal Time: 0 hours 14 minutes 37 seconds  Total Procedure Duration: 0 hours 18 minutes 30 seconds  Findings:      The perianal and digital rectal examinations were normal.      Four sessile polyps were found in the splenic flexure. The polyps were 4       to 5 mm in size. These polyps were removed with a cold snare. Resection       and retrieval were complete. Estimated blood loss was minimal.      Scattered medium-mouthed diverticula were found in the sigmoid colon and       descending colon.      Non-bleeding internal hemorrhoids were found during  retroflexion. The       hemorrhoids were moderate, medium-sized and Grade II (internal       hemorrhoids that prolapse but reduce spontaneously).      The exam was otherwise without abnormality on direct and retroflexion       views. Impression:               - Four 4 to 5 mm polyps at the splenic flexure,                            removed with a cold snare. Resected and retrieved.                           - Diverticulosis in the sigmoid colon and in the                            descending colon.                           - Non-bleeding internal hemorrhoids.                           - The examination was  otherwise normal on direct                            and retroflexion views. Moderate Sedation:      Moderate (conscious) sedation was personally administered by an       anesthesia professional. The following parameters were monitored: oxygen       saturation, heart rate, blood pressure, respiratory rate, EKG, adequacy       of pulmonary ventilation, and response to care. Recommendation:           - Patient has a contact number available for                            emergencies. The signs and symptoms of potential                            delayed complications were discussed with the                            patient. Return to normal activities tomorrow.                            Written discharge instructions were provided to the                            patient.                           - Advance diet as tolerated.                           - Continue present medications.                           - Repeat colonoscopy after studies  are complete for                            surveillance.                           - Return to GI office (date not yet determined). Procedure Code(s):        --- Professional ---                           [phone], Colonoscopy, flexible; with removal of                            tumor(s), polyp(s), or other lesion(s) by snare                            technique Diagnosis Code(s):        --- Professional ---                           D12.3, Benign neoplasm of transverse colon (hepatic                            flexure or splenic flexure)                           K64.1, Second degree hemorrhoids                           R19.5, Other fecal abnormalities                           K57.30, Diverticulosis of large intestine without                            perforation or abscess without bleeding CPT copyright 2022 American Medical Association. All rights reserved. The codes documented in this report are preliminary and upon coder review may  be  revised to meet current compliance requirements. [place] Hatcher. Tessah Patchen, MD Gemma Kelp, MD 05/15/2023 8:05:35 AM This report has been signed electronically. Number of Addenda: 0

## 2023-05-15 NOTE — Anesthesia Postprocedure Evaluation (Signed)
 Anesthesia Post Note  Patient: Patricia Sharp  Procedure(s) Performed: COLONOSCOPY WITH PROPOFOL   Patient location during evaluation: Phase II Anesthesia Type: General Level of consciousness: awake and alert Pain management: pain level controlled Vital Signs Assessment: post-procedure vital signs reviewed and stable Respiratory status: spontaneous breathing, nonlabored ventilation, respiratory function stable and patient connected to nasal cannula oxygen Cardiovascular status: blood pressure returned to baseline and stable Postop Assessment: no apparent nausea or vomiting Anesthetic complications: no   There were no known notable events for this encounter.   Last Vitals:  Vitals:   05/15/23 0758 05/15/23 0803  BP: (!) 141/41 (!) 154/54  Pulse: (!) 54 (!) 55  Resp: 19 19  Temp: 36.5 C   SpO2: 97% 97%    Last Pain:  Vitals:   05/15/23 0803  TempSrc:   PainSc: 0-No pain                 Shantai Tiedeman L Esthefany Herrig

## 2023-05-15 NOTE — Discharge Instructions (Addendum)
  Colonoscopy Discharge Instructions  Read the instructions outlined below and refer to this sheet in the next few weeks. These discharge instructions provide you with general information on caring for yourself after you leave the hospital. Your doctor may also give you specific instructions. While your treatment has been planned according to the most current medical practices available, unavoidable complications occasionally occur. If you have any problems or questions after discharge, call Dr. Riley Cheadle at [phone]. ACTIVITY You may resume your regular activity, but move at a slower pace for the next 24 hours.  Take frequent rest periods for the next 24 hours.  Walking will help get rid of the air and reduce the bloated feeling in your belly (abdomen).  No driving for 24 hours (because of the medicine (anesthesia) used during the test).   Do not sign any important legal documents or operate any machinery for 24 hours (because of the anesthesia used during the test).  NUTRITION Drink plenty of fluids.  You may resume your normal diet as instructed by your doctor.  Begin with a light meal and progress to your normal diet. Heavy or fried foods are harder to digest and may make you feel sick to your stomach (nauseated).  Avoid alcoholic beverages for 24 hours or as instructed.  MEDICATIONS You may resume your normal medications unless your doctor tells you otherwise.  WHAT YOU CAN EXPECT TODAY Some feelings of bloating in the abdomen.  Passage of more gas than usual.  Spotting of blood in your stool or on the toilet paper.  IF YOU HAD POLYPS REMOVED DURING THE COLONOSCOPY: No aspirin  products for 7 days or as instructed.  No alcohol  for 7 days or as instructed.  Eat a soft diet for the next 24 hours.  FINDING OUT THE RESULTS OF YOUR TEST Not all test results are available during your visit. If your test results are not back during the visit, make an appointment with your caregiver to find out the  results. Do not assume everything is normal if you have not heard from your caregiver or the medical facility. It is important for you to follow up on all of your test results.  SEEK IMMEDIATE MEDICAL ATTENTION IF: You have more than a spotting of blood in your stool.  Your belly is swollen (abdominal distention).  You are nauseated or vomiting.  You have a temperature over 101.  You have abdominal pain or discomfort that is severe or gets worse throughout the day.     4 small polyps found and removed today  Diverticulosis found  Information on both provided  Further recommendations to follow pending review of pathology report  Resume Xarelto  today  At patient request I called Patricia Sharp at [phone] -reviewed findings and recommendations

## 2023-05-16 ENCOUNTER — Encounter (HOSPITAL_COMMUNITY): Payer: Self-pay | Admitting: Internal Medicine

## 2023-05-16 LAB — SURGICAL PATHOLOGY

## 2023-05-23 ENCOUNTER — Encounter: Payer: Self-pay | Admitting: Internal Medicine

## 2023-07-11 ENCOUNTER — Other Ambulatory Visit (HOSPITAL_COMMUNITY): Payer: Self-pay | Admitting: Family Medicine

## 2023-07-11 DIAGNOSIS — Z1231 Encounter for screening mammogram for malignant neoplasm of breast: Secondary | ICD-10-CM

## 2023-07-15 DIAGNOSIS — I4891 Unspecified atrial fibrillation: Secondary | ICD-10-CM | POA: Diagnosis not present

## 2023-07-15 DIAGNOSIS — D6869 Other thrombophilia: Secondary | ICD-10-CM | POA: Diagnosis not present

## 2023-07-15 DIAGNOSIS — E782 Mixed hyperlipidemia: Secondary | ICD-10-CM | POA: Diagnosis not present

## 2023-07-17 DIAGNOSIS — M1711 Unilateral primary osteoarthritis, right knee: Secondary | ICD-10-CM | POA: Diagnosis not present

## 2023-07-23 DIAGNOSIS — Z7984 Long term (current) use of oral hypoglycemic drugs: Secondary | ICD-10-CM | POA: Diagnosis not present

## 2023-07-23 DIAGNOSIS — E782 Mixed hyperlipidemia: Secondary | ICD-10-CM | POA: Diagnosis not present

## 2023-07-23 DIAGNOSIS — I10 Essential (primary) hypertension: Secondary | ICD-10-CM | POA: Diagnosis not present

## 2023-07-23 DIAGNOSIS — I119 Hypertensive heart disease without heart failure: Secondary | ICD-10-CM | POA: Diagnosis not present

## 2023-07-23 DIAGNOSIS — I482 Chronic atrial fibrillation, unspecified: Secondary | ICD-10-CM | POA: Diagnosis not present

## 2023-07-23 DIAGNOSIS — Z7901 Long term (current) use of anticoagulants: Secondary | ICD-10-CM | POA: Diagnosis not present

## 2023-07-23 DIAGNOSIS — E1169 Type 2 diabetes mellitus with other specified complication: Secondary | ICD-10-CM | POA: Diagnosis not present

## 2023-07-23 DIAGNOSIS — Z7689 Persons encountering health services in other specified circumstances: Secondary | ICD-10-CM | POA: Diagnosis not present

## 2023-07-23 DIAGNOSIS — Z79899 Other long term (current) drug therapy: Secondary | ICD-10-CM | POA: Diagnosis not present

## 2023-07-24 ENCOUNTER — Ambulatory Visit: Attending: Internal Medicine | Admitting: Internal Medicine

## 2023-07-24 ENCOUNTER — Encounter: Payer: Self-pay | Admitting: Internal Medicine

## 2023-07-24 VITALS — BP 176/76 | HR 58 | Ht 61.0 in | Wt 170.0 lb

## 2023-07-24 DIAGNOSIS — I4819 Other persistent atrial fibrillation: Secondary | ICD-10-CM

## 2023-07-24 NOTE — Progress Notes (Signed)
 HPI [name] returns for followup of atrial fib. She is a pleasant 73 yo woman with HTN and was found to have atrial fib with a controlled VR. She had an echo demonstrating LAE with a LA size of 54 and preserved LV function. She underwent DCCV and went back to NSR but then had ERAF. She then was admitted for dofetilide  and is better and appears to be maintaining NSR, now for almost 2 years.  She notes some peripheral edema. She had been on HCTZ but this was stopped when we started the dofetilide . She has been unable to lose weight. No recurrent symptoms of afib.  Allergies  Allergen Reactions   Z-Pak [Azithromycin] Rash   Zinacef [Cefuroxime] Rash     Current Outpatient Medications  Medication Sig Dispense Refill   acetaminophen  (TYLENOL ) 500 MG tablet Take 2 tablets (1,000 mg total) by mouth every 6 (six) hours as needed for mild pain or moderate pain. (Patient taking differently: Take 1,000 mg by mouth 2 (two) times daily as needed for mild pain (pain score 1-3).) 60 tablet 0   ALPRAZolam  (XANAX ) 0.5 MG tablet Take 0.25-0.5 mg by mouth at bedtime as needed for anxiety or sleep.      carboxymethylcellulose (REFRESH PLUS) 0.5 % SOLN Place 1 drop into both eyes daily.     Cholecalciferol (VITAMIN D3) 1000 units CAPS Take 1,000 Units by mouth daily.      Coenzyme Q10 (COQ10) 100 MG CAPS Take by mouth.     diltiazem  (CARDIZEM  CD) 300 MG 24 hr capsule TAKE ONE CAPSULE (300MG  TOTAL) BY MOUTH DAILY 30 capsule 6   dofetilide  (TIKOSYN ) 500 MCG capsule TAKE ONE CAPSULE (500 MCG TOTAL) BY MOUTH TWO TIMES DAILY. 180 capsule 2   furosemide  (LASIX ) 20 MG tablet Take 1 tablet (20 mg total) by mouth daily as needed. 90 tablet 3   magnesium  oxide (MAG-OX) 400 MG tablet Take 1 tablet (400 mg total) by mouth daily. 90 tablet 1   metFORMIN  (GLUCOPHAGE ) 500 MG tablet Take 250 mg by mouth 2 (two) times daily.     olmesartan (BENICAR) 40 MG tablet Take 40 mg by mouth daily.     Omega-3 Fatty Acids (FISH  OIL) 1000 MG CAPS Take 1,000 mg by mouth daily.     potassium chloride  (KLOR-CON ) 10 MEQ tablet Take 2 tablets (20 mEq total) by mouth daily. 180 tablet 2   rosuvastatin (CRESTOR) 10 MG tablet Take 10 mg by mouth daily.     trolamine salicylate (ASPERCREME) 10 % cream Apply 1 Application topically at bedtime as needed (hand pain).     XARELTO  20 MG TABS tablet TAKE ONE (1) TABLET BY MOUTH EVERY DAY WITH SUPPER 30 tablet 5   No current facility-administered medications for this visit.     Past Medical History:  Diagnosis Date   Anemia    Anxiety    Arthritis    Atrial fibrillation (HCC) 03/2021   Depression    Diabetes mellitus without complication (HCC)    Dysrhythmia    Essential hypertension, benign 10/29/2017   Hypertension     ROS:   All systems reviewed and negative except as noted in the HPI.   Past Surgical History:  Procedure Laterality Date   ABDOMINAL HYSTERECTOMY     ABDOMINAL SURGERY     to remove fibroid tumors   BIOPSY  11/25/2017   Procedure: BIOPSY;  Surgeon: Golda Claudis PENNER, MD;  Location: [facility];  Service: Endoscopy;;  gastric   CARDIOVERSION N/A 05/09/2021   Procedure: CARDIOVERSION;  Surgeon: Shlomo Wilbert SAUNDERS, MD;  Location: [facility];  Service: Cardiovascular;  Laterality: N/A;   CHOLECYSTECTOMY     COLONOSCOPY N/A 01/02/2013   Procedure: COLONOSCOPY;  Surgeon: Claudis RAYMOND Rivet, MD;  Location: [facility];  Service: Endoscopy;  Laterality: N/A;  825   COLONOSCOPY WITH PROPOFOL  N/A 05/15/2023   Procedure: COLONOSCOPY WITH PROPOFOL ;  Surgeon: Shaaron Lamar HERO, MD;  Location: [facility];  Service: Endoscopy;  Laterality: N/A;  7:30 am, asa 3   ESOPHAGOGASTRODUODENOSCOPY N/A 11/25/2017   Procedure: ESOPHAGOGASTRODUODENOSCOPY (EGD);  Surgeon: Rivet Claudis RAYMOND, MD;  Location: [facility];  Service: Endoscopy;  Laterality: N/A;  12:00-moved to 11/11 @ 9:55am per Jenkins   REFRACTIVE SURGERY Bilateral    TOTAL KNEE ARTHROPLASTY Left 05/10/2020   Procedure:  TOTAL KNEE ARTHROPLASTY;  Surgeon: Beverley Evalene BIRCH, MD;  Location: [facility];  Service: Orthopedics;  Laterality: Left;     Family History  Problem Relation Age of Onset   Atrial fibrillation Mother    Colon cancer Neg Hx      Social History   Socioeconomic History   Marital status: Widowed    Spouse name: Not on file   Number of children: Not on file   Years of education: Not on file   Highest education level: Not on file  Occupational History   Not on file  Tobacco Use   Smoking status: Never   Smokeless tobacco: Never   Tobacco comments:    Never smoke 08/10/21  Vaping Use   Vaping status: Never Used  Substance and Sexual Activity   Alcohol  use: No   Drug use: No   Sexual activity: Not on file  Other Topics Concern   Not on file  Social History Narrative   Not on file   Social Drivers of Health   Financial Resource Strain: Not on file  Food Insecurity: Not on file  Transportation Needs: Not on file  Physical Activity: Not on file  Stress: Not on file  Social Connections: Not on file  Intimate Partner Violence: Not on file     BP (!) 178/80   Pulse (!) 58   Ht 5' 1 (1.549 m)   Wt 170 lb (77.1 kg)   SpO2 97%   BMI 32.12 kg/m   Physical Exam:  Well appearing 73 yo woman, NAD HEENT: Unremarkable Neck:  No JVD, no thyromegally Lymphatics:  No adenopathy Back:  No CVA tenderness Lungs:  Clear with no wheezes HEART:  Regular rate rhythm, no murmurs, no rubs, no clicks Abd:  soft, positive bowel sounds, no organomegally, no rebound, no guarding Ext:  2 plus pulses, no edema, no cyanosis, no clubbing Skin:  No rashes no nodules Neuro:  CN II through XII intact, motor grossly intact   Assess/Plan:  Persistent atrial fib - she appears to be maintaining NSR on dofetilide .  Peripheral edema - I encouraged her to take as needed lasix , reduce her salt intake and keep her legs elevated.  Obesity - she is encouraged to lose weight. We discussed  Ozempic. Hypertertension - Her bp is up in the office but better at home. I encouraged her to avoid salty foods.   Danelle Saxon Crosby,MD

## 2023-07-24 NOTE — Patient Instructions (Signed)

## 2023-07-25 ENCOUNTER — Other Ambulatory Visit (HOSPITAL_COMMUNITY): Payer: Self-pay

## 2023-07-25 DIAGNOSIS — Z13820 Encounter for screening for osteoporosis: Secondary | ICD-10-CM

## 2023-07-30 DIAGNOSIS — H35033 Hypertensive retinopathy, bilateral: Secondary | ICD-10-CM | POA: Diagnosis not present

## 2023-07-30 DIAGNOSIS — E119 Type 2 diabetes mellitus without complications: Secondary | ICD-10-CM | POA: Diagnosis not present

## 2023-07-30 DIAGNOSIS — H2513 Age-related nuclear cataract, bilateral: Secondary | ICD-10-CM | POA: Diagnosis not present

## 2023-07-30 DIAGNOSIS — H401131 Primary open-angle glaucoma, bilateral, mild stage: Secondary | ICD-10-CM | POA: Diagnosis not present

## 2023-08-13 DIAGNOSIS — I10 Essential (primary) hypertension: Secondary | ICD-10-CM | POA: Diagnosis not present

## 2023-08-13 DIAGNOSIS — E119 Type 2 diabetes mellitus without complications: Secondary | ICD-10-CM | POA: Diagnosis not present

## 2023-08-13 DIAGNOSIS — E782 Mixed hyperlipidemia: Secondary | ICD-10-CM | POA: Diagnosis not present

## 2023-08-13 DIAGNOSIS — Z7689 Persons encountering health services in other specified circumstances: Secondary | ICD-10-CM | POA: Diagnosis not present

## 2023-08-15 DIAGNOSIS — E782 Mixed hyperlipidemia: Secondary | ICD-10-CM | POA: Diagnosis not present

## 2023-08-15 DIAGNOSIS — D6869 Other thrombophilia: Secondary | ICD-10-CM | POA: Diagnosis not present

## 2023-08-15 DIAGNOSIS — I4891 Unspecified atrial fibrillation: Secondary | ICD-10-CM | POA: Diagnosis not present

## 2023-08-19 ENCOUNTER — Ambulatory Visit (HOSPITAL_COMMUNITY)
Admission: RE | Admit: 2023-08-19 | Discharge: 2023-08-19 | Disposition: A | Discharge status: Home / Self Care | Source: Ambulatory Visit

## 2023-08-19 ENCOUNTER — Ambulatory Visit (HOSPITAL_COMMUNITY)
Admission: RE | Admit: 2023-08-19 | Discharge: 2023-08-19 | Disposition: A | Discharge status: Home / Self Care | Source: Ambulatory Visit | Attending: Family Medicine | Admitting: Family Medicine

## 2023-08-19 ENCOUNTER — Encounter (HOSPITAL_COMMUNITY): Payer: Self-pay

## 2023-08-19 DIAGNOSIS — Z13820 Encounter for screening for osteoporosis: Secondary | ICD-10-CM | POA: Diagnosis not present

## 2023-08-19 DIAGNOSIS — Z780 Asymptomatic menopausal state: Secondary | ICD-10-CM | POA: Insufficient documentation

## 2023-08-19 DIAGNOSIS — Z1231 Encounter for screening mammogram for malignant neoplasm of breast: Secondary | ICD-10-CM | POA: Diagnosis not present

## 2023-08-20 DIAGNOSIS — E782 Mixed hyperlipidemia: Secondary | ICD-10-CM | POA: Diagnosis not present

## 2023-08-20 DIAGNOSIS — E119 Type 2 diabetes mellitus without complications: Secondary | ICD-10-CM | POA: Diagnosis not present

## 2023-08-20 DIAGNOSIS — I10 Essential (primary) hypertension: Secondary | ICD-10-CM | POA: Diagnosis not present

## 2023-08-20 DIAGNOSIS — Z0000 Encounter for general adult medical examination without abnormal findings: Secondary | ICD-10-CM | POA: Diagnosis not present

## 2023-08-20 DIAGNOSIS — I482 Chronic atrial fibrillation, unspecified: Secondary | ICD-10-CM | POA: Diagnosis not present

## 2023-08-30 ENCOUNTER — Telehealth: Payer: Self-pay | Admitting: Internal Medicine

## 2023-08-30 DIAGNOSIS — I10 Essential (primary) hypertension: Secondary | ICD-10-CM | POA: Diagnosis not present

## 2023-08-30 DIAGNOSIS — Z7984 Long term (current) use of oral hypoglycemic drugs: Secondary | ICD-10-CM | POA: Diagnosis not present

## 2023-08-30 DIAGNOSIS — I482 Chronic atrial fibrillation, unspecified: Secondary | ICD-10-CM | POA: Diagnosis not present

## 2023-08-30 DIAGNOSIS — Z79899 Other long term (current) drug therapy: Secondary | ICD-10-CM | POA: Diagnosis not present

## 2023-08-30 DIAGNOSIS — E782 Mixed hyperlipidemia: Secondary | ICD-10-CM | POA: Diagnosis not present

## 2023-08-30 DIAGNOSIS — E119 Type 2 diabetes mellitus without complications: Secondary | ICD-10-CM | POA: Diagnosis not present

## 2023-08-30 NOTE — Telephone Encounter (Signed)
 Pt c/o BP issue: STAT if pt c/o blurred vision, one-sided weakness or slurred speech.   1. What is your BP concern?  Taniya with Dr. Zack Hall's [facility] says their provider Lauraine, NP, saw patient at 10:30 AM today. BP has been elevated and she advised to follow up with cardiology regarding medications, Olmesartan 40 MG and Diltiazem .   2. Have you taken any BP medication today? Patient normally takes Olmesartan in the morning, but unsure whether or not she took it today.  3. What are your last 5 BP readings? Per Emile, NP reports patient monitors BP at home--systolic has been 194-200 and diastolic has been 60-160 over the past 2 weeks.  4. Are you having any other symptoms (ex. Dizziness, headache, blurred vision, passed out)?  No symptoms reported.

## 2023-08-30 NOTE — Telephone Encounter (Signed)
  Dr Shona needs to know if there are any treatment or if patient needs to be seen at our office due to AFib medication. Her bp is elevated 160/60. What would Dr Waddell recommend?

## 2023-08-30 NOTE — Telephone Encounter (Signed)
  Spoke to patient she stated she saw Dr.Hall's NP today.Her B/P was elevated and he wanted to make sure the medication he was going to prescribe ok to take with her other medications. Called Dr.Hall's office left message to call back with name of medication that NP what's to prescribe.

## 2023-08-30 NOTE — Telephone Encounter (Signed)
 Returned call to office to clarify what what was needed from our office as

## 2023-09-02 NOTE — Telephone Encounter (Signed)
Left message for office to call.

## 2023-09-03 NOTE — Telephone Encounter (Signed)
 Office called back to give two options for bp   Carvedilol or spironolactone is okay.  Please advise

## 2023-09-03 NOTE — Telephone Encounter (Signed)
I will forward to Dr.Taylor for review.

## 2023-09-04 DIAGNOSIS — Z7984 Long term (current) use of oral hypoglycemic drugs: Secondary | ICD-10-CM | POA: Diagnosis not present

## 2023-09-04 DIAGNOSIS — E119 Type 2 diabetes mellitus without complications: Secondary | ICD-10-CM | POA: Diagnosis not present

## 2023-09-04 DIAGNOSIS — Z79899 Other long term (current) drug therapy: Secondary | ICD-10-CM | POA: Diagnosis not present

## 2023-09-04 DIAGNOSIS — I10 Essential (primary) hypertension: Secondary | ICD-10-CM | POA: Diagnosis not present

## 2023-09-04 DIAGNOSIS — F419 Anxiety disorder, unspecified: Secondary | ICD-10-CM | POA: Diagnosis not present

## 2023-09-04 DIAGNOSIS — I119 Hypertensive heart disease without heart failure: Secondary | ICD-10-CM | POA: Diagnosis not present

## 2023-09-04 DIAGNOSIS — E782 Mixed hyperlipidemia: Secondary | ICD-10-CM | POA: Diagnosis not present

## 2023-09-04 DIAGNOSIS — I482 Chronic atrial fibrillation, unspecified: Secondary | ICD-10-CM | POA: Diagnosis not present

## 2023-09-06 NOTE — Telephone Encounter (Signed)
 Noted, will forward to Dr.Taylor

## 2023-09-06 NOTE — Telephone Encounter (Signed)
 Patricia Sharp with Dr Milford office left voicemail for Dr Waddell to follow up on this request. Patricia Sharp is best reached at [phone]

## 2023-09-09 ENCOUNTER — Telehealth: Payer: Self-pay | Admitting: Internal Medicine

## 2023-09-09 DIAGNOSIS — Z79899 Other long term (current) drug therapy: Secondary | ICD-10-CM

## 2023-09-09 DIAGNOSIS — I10 Essential (primary) hypertension: Secondary | ICD-10-CM

## 2023-09-09 MED ORDER — SPIRONOLACTONE 25 MG PO TABS: 25.0000 mg | ORAL_TABLET | Freq: Every day | ORAL | 3 refills | Status: AC

## 2023-09-09 NOTE — Telephone Encounter (Signed)
 Pt c/o BP issue: STAT if pt c/o blurred vision, one-sided weakness or slurred speech.  STAT if BP is GREATER than 180/120 TODAY.  STAT if BP is LESS than 90/60 and SYMPTOMATIC TODAY  1. What is your BP concern? Pt states that BP has been elevated for the last 2 weeks  2. Have you taken any BP medication today?Yes  3. What are your last 5 BP readings?  172/62, 187/64  4. Are you having any other symptoms (ex. Dizziness, headache, blurred vision, passed out)? No   Pt requesting c/b from nurse in regards to medications as well

## 2023-09-09 NOTE — Telephone Encounter (Signed)
 Patient calling to follow on request from PCP about changing her BP medications (see previous phone note). They were recommending carvedilol or spironolactone  and would like input from Dr. Waddell. Patient reports that BP has been elevated for a few weeks now. Averaging around 170-190s/60-90s. Patient is currently taking olmesartan 40 mg daily, lasix  20 mg, and diltiazem  300 mg daily. Reports that blood pressures are usually higher in the morning and evenings. She states that she has been feeling fine but is anxious. Advised message would be sent to Dr. Waddell.

## 2023-09-09 NOTE — Telephone Encounter (Signed)
 Per Dr. Waddell  Start Aldactone  25 mg Daily, check BMP in 2 wks.   Pt notified and agrees with plan of care.

## 2023-09-11 NOTE — Telephone Encounter (Signed)
 Pt would like to know if she can take her new medication from previous phone with her other BP medicine.

## 2023-09-11 NOTE — Telephone Encounter (Signed)
 I spoke with patient and she will take her spironolactone  with her other medications.

## 2023-09-17 NOTE — Telephone Encounter (Signed)
 Left message to call back on Dr Lemmie triage line.

## 2023-09-18 DIAGNOSIS — I482 Chronic atrial fibrillation, unspecified: Secondary | ICD-10-CM | POA: Diagnosis not present

## 2023-09-18 DIAGNOSIS — I10 Essential (primary) hypertension: Secondary | ICD-10-CM | POA: Diagnosis not present

## 2023-09-18 DIAGNOSIS — I110 Hypertensive heart disease with heart failure: Secondary | ICD-10-CM | POA: Diagnosis not present

## 2023-09-18 DIAGNOSIS — Z7901 Long term (current) use of anticoagulants: Secondary | ICD-10-CM | POA: Diagnosis not present

## 2023-09-18 DIAGNOSIS — F419 Anxiety disorder, unspecified: Secondary | ICD-10-CM | POA: Diagnosis not present

## 2023-09-18 DIAGNOSIS — Z79899 Other long term (current) drug therapy: Secondary | ICD-10-CM | POA: Diagnosis not present

## 2023-09-24 ENCOUNTER — Other Ambulatory Visit (HOSPITAL_COMMUNITY)
Admission: RE | Admit: 2023-09-24 | Discharge: 2023-09-24 | Disposition: A | Discharge status: Home / Self Care | Source: Ambulatory Visit | Attending: Internal Medicine | Admitting: Internal Medicine

## 2023-09-24 DIAGNOSIS — I10 Essential (primary) hypertension: Secondary | ICD-10-CM | POA: Diagnosis not present

## 2023-09-24 DIAGNOSIS — Z79899 Other long term (current) drug therapy: Secondary | ICD-10-CM | POA: Insufficient documentation

## 2023-09-24 LAB — BASIC METABOLIC PANEL WITH GFR
Anion gap: 12 (ref 5–15)
BUN: 25 mg/dL — ABNORMAL HIGH (ref 8–23)
CO2: 23 mmol/L (ref 22–32)
Calcium: 9.2 mg/dL (ref 8.9–10.3)
Chloride: 101 mmol/L (ref 98–111)
Creatinine, Ser: 1.2 mg/dL — ABNORMAL HIGH (ref 0.44–1.00)
GFR, Estimated: 48 mL/min — ABNORMAL LOW (ref >60)
Glucose, Bld: 195 mg/dL — ABNORMAL HIGH (ref 70–99)
Potassium: 4.4 mmol/L (ref 3.5–5.1)
Sodium: 136 mmol/L (ref 135–145)

## 2023-10-21 DIAGNOSIS — H35033 Hypertensive retinopathy, bilateral: Secondary | ICD-10-CM | POA: Diagnosis not present

## 2023-10-21 DIAGNOSIS — E119 Type 2 diabetes mellitus without complications: Secondary | ICD-10-CM | POA: Diagnosis not present

## 2023-10-21 DIAGNOSIS — H401131 Primary open-angle glaucoma, bilateral, mild stage: Secondary | ICD-10-CM | POA: Diagnosis not present

## 2023-10-21 DIAGNOSIS — H2513 Age-related nuclear cataract, bilateral: Secondary | ICD-10-CM | POA: Diagnosis not present

## 2023-10-23 DIAGNOSIS — R7303 Prediabetes: Secondary | ICD-10-CM | POA: Diagnosis not present

## 2023-10-23 DIAGNOSIS — I482 Chronic atrial fibrillation, unspecified: Secondary | ICD-10-CM | POA: Diagnosis not present

## 2023-10-23 DIAGNOSIS — I10 Essential (primary) hypertension: Secondary | ICD-10-CM | POA: Diagnosis not present

## 2023-10-23 DIAGNOSIS — I1310 Hypertensive heart and chronic kidney disease without heart failure, with stage 1 through stage 4 chronic kidney disease, or unspecified chronic kidney disease: Secondary | ICD-10-CM | POA: Diagnosis not present

## 2023-10-23 DIAGNOSIS — N1831 Chronic kidney disease, stage 3a: Secondary | ICD-10-CM | POA: Diagnosis not present

## 2023-10-23 DIAGNOSIS — F419 Anxiety disorder, unspecified: Secondary | ICD-10-CM | POA: Diagnosis not present

## 2023-10-31 ENCOUNTER — Other Ambulatory Visit (HOSPITAL_COMMUNITY): Payer: Self-pay | Admitting: Physician Assistant

## 2023-11-11 ENCOUNTER — Telehealth: Payer: Self-pay | Admitting: Internal Medicine

## 2023-11-11 NOTE — Telephone Encounter (Signed)
 Patient wants a call back to discuss her BP and heart issue.

## 2023-11-12 NOTE — Telephone Encounter (Signed)
 Pt c/o increased SOB and weakness over the last 2 weeks. Pt reports that when she exerts herself she is getting weak easier. She was seen recently by PCP and was put on Farxiga, Crestor 10 mg and Zoloft. Please advise.

## 2023-11-15 ENCOUNTER — Other Ambulatory Visit (HOSPITAL_COMMUNITY): Payer: Self-pay | Admitting: Physician Assistant

## 2023-11-15 NOTE — Telephone Encounter (Signed)
 Offered apt on 11/18/2023 at 1:30 pm with E.Peck,NP, patient accepted

## 2023-11-18 ENCOUNTER — Ambulatory Visit: Attending: Nurse Practitioner | Admitting: Nurse Practitioner

## 2023-11-18 ENCOUNTER — Ambulatory Visit: Attending: Nurse Practitioner

## 2023-11-18 ENCOUNTER — Encounter: Payer: Self-pay | Admitting: Nurse Practitioner

## 2023-11-18 ENCOUNTER — Telehealth: Payer: Self-pay | Admitting: Nurse Practitioner

## 2023-11-18 ENCOUNTER — Other Ambulatory Visit: Payer: Self-pay | Admitting: *Deleted

## 2023-11-18 VITALS — BP 136/58 | HR 47 | Ht 61.0 in | Wt 166.2 lb

## 2023-11-18 DIAGNOSIS — R29898 Other symptoms and signs involving the musculoskeletal system: Secondary | ICD-10-CM

## 2023-11-18 DIAGNOSIS — I10 Essential (primary) hypertension: Secondary | ICD-10-CM

## 2023-11-18 DIAGNOSIS — R0789 Other chest pain: Secondary | ICD-10-CM | POA: Diagnosis not present

## 2023-11-18 DIAGNOSIS — I4891 Unspecified atrial fibrillation: Secondary | ICD-10-CM | POA: Diagnosis not present

## 2023-11-18 DIAGNOSIS — R5383 Other fatigue: Secondary | ICD-10-CM

## 2023-11-18 DIAGNOSIS — R600 Localized edema: Secondary | ICD-10-CM | POA: Diagnosis not present

## 2023-11-18 DIAGNOSIS — R001 Bradycardia, unspecified: Secondary | ICD-10-CM | POA: Diagnosis not present

## 2023-11-18 DIAGNOSIS — E669 Obesity, unspecified: Secondary | ICD-10-CM

## 2023-11-18 MED ORDER — DILTIAZEM HCL ER COATED BEADS 240 MG PO CP24: 240.0000 mg | ORAL_CAPSULE | Freq: Every day | ORAL | 3 refills | Status: DC

## 2023-11-18 MED ORDER — DOFETILIDE 500 MCG PO CAPS: 500.0000 ug | ORAL_CAPSULE | Freq: Two times a day (BID) | ORAL | 0 refills | Status: AC

## 2023-11-18 NOTE — Telephone Encounter (Signed)
 Pt called to asking if her heart monitor that got put on today has been activated. please advise.

## 2023-11-18 NOTE — Telephone Encounter (Signed)
 Pt notified that monitor was activated in office during visit. She is to push the button for sx.

## 2023-11-18 NOTE — Telephone Encounter (Signed)
 7 day ZIO Monitor

## 2023-11-18 NOTE — Patient Instructions (Addendum)
 Medication Instructions:  Your physician has recommended you make the following change in your medication:   Decrease Diltiazem  to 240 mg Daily   Your physician recommends that you schedule a follow-up appointment in: 1 Week for a blood pressure check. Please bring your home monitor to this visit.   *If you need a refill on your cardiac medications before your next appointment, please call your pharmacy*  Lab Work: NONE   If you have labs (blood work) drawn today and your tests are completely normal, you will receive your results only by: MyChart Message (if you have MyChart) OR A paper copy in the mail If you have any lab test that is abnormal or we need to change your treatment, we will call you to review the results.  Testing/Procedures: ZIO XT- Long Term Monitor Instructions   Your physician has requested you wear your ZIO patch monitor___7____days.   This is a single patch monitor.  [employer] supplies one patch monitor per enrollment.  Additional stickers are not available.   Please do not apply patch if you will be having a Nuclear Stress Test, Echocardiogram, Cardiac CT, MRI, or Chest Xray during the time frame you would be wearing the monitor. The patch cannot be worn during these tests.  You cannot remove and re-apply the ZIO XT patch monitor.   Your ZIO patch monitor will be sent USPS Priority mail from [facility] directly to your home address. The monitor may also be mailed to a PO BOX if home delivery is not available.   It may take 3-5 days to receive your monitor after you have been enrolled.   Once you have received you monitor, please review enclosed instructions.  Your monitor has already been registered assigning a specific monitor serial # to you.   Applying the monitor   Shave hair from upper left chest.   Hold abrader disc by orange tab.  Rub abrader in 40 strokes over left upper chest as indicated in your monitor instructions.   Clean area with 4  enclosed alcohol  pads .  Use all pads to assure are is cleaned thoroughly.  Let dry.   Apply patch as indicated in monitor instructions.  Patch will be place under collarbone on left side of chest with arrow pointing upward.   Rub patch adhesive wings for 2 minutes.Remove white label marked 1.  Remove white label marked 2.  Rub patch adhesive wings for 2 additional minutes.   While looking in a mirror, press and release button in center of patch.  A small green light will flash 3-4 times .  This will be your only indicator the monitor has been turned on.     Do not shower for the first 24 hours.  You may shower after the first 24 hours.   Press button if you feel a symptom. You will hear a small click.  Record Date, Time and Symptom in the Patient Log Book.   When you are ready to remove patch, follow instructions on last 2 pages of Patient Log Book.  Stick patch monitor onto last page of Patient Log Book.   Place Patient Log Book in [place] box.  Use locking tab on box and tape box closed securely.  The [employer] has [employer] on it.  Please place in mailbox as soon as possible.  Your physician should have your test results approximately 7 days after the monitor has been mailed back to [facility].   Call [employer]  Customer Care at [phone] if you have questions regarding your ZIO XT patch monitor.  Call them immediately if you see an orange light blinking on your monitor.   If your monitor falls off in less than 4 days contact our Monitor department at [phone].  If your monitor becomes loose or falls off after 4 days call [employer] at [phone] for suggestions on securing your monitor.     Follow-Up: At [facility], you and your health needs are our priority.  As part of our continuing mission to provide you with exceptional heart care, our providers are all part of one team.  This team includes your primary Cardiologist (physician) and  Advanced Practice Providers or APPs (Physician Assistants and Nurse Practitioners) who all work together to provide you with the care you need, when you need it.  Your next appointment:   6-8 Weeks   Provider:   Almarie Crate, NP    We recommend signing up for the patient portal called MyChart.  Sign up information is provided on this After Visit Summary.  MyChart is used to connect with patients for Virtual Visits (Telemedicine).  Patients are able to view lab/test results, encounter notes, upcoming appointments, etc.  Non-urgent messages can be sent to your provider as well.   To learn more about what you can do with MyChart, go to [email or web address].   Other Instructions Thank you for choosing [facility]!

## 2023-11-18 NOTE — Progress Notes (Signed)
 Cardiology Office Note   Date:  11/18/2023  ID:  Kairy, [place] Aug 04, 1950, MRN [id number] PCP: [name], MD  [facility] Providers Cardiologist:  Maude Emmer, MD     History of Present Illness Patricia Sharp is a 73 y.o. female with a PMH of A-fib, HTN, peripheral edema, and obesity, who presents today for HTN evaluation. Followed by [facility] and EP.   Last seen by Laymon Qua, PA-C on 06/16/2021. Noted some occasional palpitations, but otherwise was overall doing well. In the interim, she has been followed by the [facility] and EP.   She is here for follow-up. She presents several chief concerns today. Admits to fatigue and shortness of breath with exertion, feels like she has no stamina. Does admit to occasional twinges of chest discomfort along the center of her chest, difficult to describe, denies any association to exertion. Brief in duration, but hard to tell how long it lasts. Does have some leg weakness at times that she attributes to her lower lumbar back pain. Does present BP log with SBP overall not at goal and showing elevated readings and HR averaging around 47 bpm. Denies any palpitations, syncope, presyncope, orthopnea, PND, swelling or significant weight changes, acute bleeding, or claudication. Does have some slight dizziness, denies any orthostatic symptoms.    ROS: Negative.   Studies Reviewed  EKG:  EKG Interpretation Date/Time:  Monday November 18 2023 13:26:45 EST Ventricular Rate:  47 PR Interval:  146 QRS Duration:  62 QT Interval:  490 QTC Calculation: 433 R Axis:   44  Text Interpretation: Sinus bradycardia Septal infarct (cited on or before 02-Aug-2021) When compared with ECG of 13-Mar-2023 09:42, Questionable change in initial forces of Anterior leads Nonspecific T wave abnormality now evident in Anterior leads Confirmed by Miriam Norris [phone]) on 11/18/2023 1:28:12 PM   Echo 03/2021:  1. Left ventricular ejection fraction, by  estimation, is 60 to 65%. The  left ventricle has normal function. The left ventricle has no regional  wall motion abnormalities. There is mild left ventricular hypertrophy.  Left ventricular diastolic parameters  are indeterminate.   2. Right ventricular systolic function is normal. The right ventricular  size is normal. Tricuspid regurgitation signal is inadequate for assessing  PA pressure.   3. Left atrial size was severely dilated.   4. Right atrial size was mildly dilated.   5. The mitral valve is normal in structure. Trivial mitral valve  regurgitation. No evidence of mitral stenosis.   6. The aortic valve is tricuspid. Aortic valve regurgitation is mild. No  aortic stenosis is present.   7. The inferior vena cava is normal in size with greater than 50%  respiratory variability, suggesting right atrial pressure of 3 mmHg.   Lexiscan  12/2017: No diagnostic ST segment changes to indicate ischemia. Occasional to frequent PVCs and PACs noted during infusion. Small, mild intensity, apical anterior defect that is fixed and consistent with breast attenuation. No definite ischemic defects. This is a low risk study. Nuclear stress EF: 69%.      Physical Exam VS:  BP (!) 136/58 (BP Location: Left Arm, Cuff Size: Large)   Pulse (!) 47   Ht 5' 1 (1.549 m)   Wt 166 lb 3.2 oz (75.4 kg)   SpO2 97%   BMI 31.40 kg/m        Wt Readings from Last 3 Encounters:  11/18/23 166 lb 3.2 oz (75.4 kg)  07/24/23 170 lb (77.1 kg)  05/15/23  165 lb (74.8 kg)    GEN: Obese 73 y.o. female in no acute distress NECK: No JVD; No carotid bruits CARDIAC: S1/S2, RRR, no murmurs, rubs, gallops RESPIRATORY:  Clear to auscultation without rales, wheezing or rhonchi  ABDOMEN: Soft, non-tender, non-distended EXTREMITIES:  Trace pitting edema to BLE (R>L - chronic per patient's report); No deformity   ASSESSMENT AND PLAN  Bradycardia, Paroxysmal A-fib, fatigue Heart rates have been averaging around mid  to upper 40s, believe this is contributing to a lot of her symptoms including her fatigue, low energy.  Will arrange 1 week ZIO XT monitor and decrease diltiazem  to 240 mg daily.  Will bring her back in 1 week for BP check-see below.  Heart healthy diet and regular cardiovascular exercise encouraged.  Care and ED precautions discussed.  Continue Xarelto  for stroke prevention.  Continue Tikosyn  - this being managed by electrophysiology and we will request refill as they are managing this.  Continue follow-up with EP and A-fib clinic.  2. Atypical chest pain Very atypical in etiology and difficult for her to describe.  Does have a nonspecific T wave abnormality that was seen from earlier this year, and seen today on EKG.  Denies any current chest pain.  No indication for ischemic valuation at this time due to her bradycardia and hesitant to prescribe nitroglycerin as needed due to this as well.  Will continue to monitor.  Care and ED precautions discussed.  If symptoms are not improved by next office visit, can discuss this further and possibly consider CTA cardiac if kidney function is stable.  HTN Home BPs are elevated not at goal, BP today in office is at goal.  Decreasing her diltiazem  as noted above.  Will bring her back in 1 week for BP check as her home cuff may not be accurate.  If BP continues to remain elevated, plan to stop olmesartan and switch to low dose telmisartan. Discussed to monitor BP at home at least 2 hours after medications and sitting for 5-10 minutes.  No other medication changes at this time. Heart healthy diet and regular cardiovascular exercise encouraged.   Leg edema, leg weakness She does appear to have some type of spinal disease, possibly lumbar disc disease that is affecting her sensation of leg weakness.  Leg edema is secondary due to chronic venous insufficiency.  Varicose veins noted on exam.  Slightly increased in right lower extremity compared to left lower extremity, has  had past knee replacement in this leg.  Continue current medication regimen.  Obesity   Weight loss via diet and exercise encouraged. Discussed the impact being overweight would have on cardiovascular risk.   Dispo: Care and ED precautions discussed. Follow-up with MD/APP in 6 to 8 weeks or sooner any changes.  Signed, Almarie Crate, NP

## 2023-11-22 ENCOUNTER — Telehealth: Payer: Self-pay | Admitting: Nurse Practitioner

## 2023-11-22 NOTE — Telephone Encounter (Signed)
 Patient is wearing a heart monitor. She is supposed to take it off on Monday at the same time she has appointment for a Blood Pressure Check. She is wanting to know if she can take it off on Monday morning instead of waiting.

## 2023-11-22 NOTE — Telephone Encounter (Signed)
 Reports monitor was placed 11/18/2023 at 2:30 pm. Advised to remove monitor at 2:30 pm on 11/25/2023. Verbalized understanding.

## 2023-11-25 ENCOUNTER — Ambulatory Visit: Attending: Cardiology | Admitting: *Deleted

## 2023-11-25 VITALS — BP 140/52 | HR 47

## 2023-11-25 DIAGNOSIS — I10 Essential (primary) hypertension: Secondary | ICD-10-CM

## 2023-11-25 NOTE — Progress Notes (Unsigned)
 Patient in office for nurse BP check - recent decrease on her Diltiazem  CD to 240mg  daily - states that she is feeling better since the decrease.   140/52  47 - manual  162/94 50 - her BP monitor   Reminded her to check BP readings 2 hours after medications with sitting 5-10 minutes prior.  BP LOG BELOW -   11/3 - 127/74  57 -  had to replace batteries 11/4 - 163/60  43 - am            144/90  51 - pm  11/5 - 161/67  48 - am            180/64  53 - pm 11/6 - 152/83  48 - am  11/7 - 156/69  46 - am            159/67  65 - pm  11/8 - 143/53  53 - am           147/56  48 - pm  11/9 - 142/59  51 - am            142/60  49 - pm  11/10 - 147/53  63 - am

## 2023-11-26 NOTE — Progress Notes (Signed)
 Patient notified and verbalized understanding.

## 2023-11-27 DIAGNOSIS — I4891 Unspecified atrial fibrillation: Secondary | ICD-10-CM | POA: Diagnosis not present

## 2023-11-27 DIAGNOSIS — R001 Bradycardia, unspecified: Secondary | ICD-10-CM | POA: Diagnosis not present

## 2023-11-29 DIAGNOSIS — I4891 Unspecified atrial fibrillation: Secondary | ICD-10-CM | POA: Diagnosis not present

## 2023-11-29 DIAGNOSIS — R001 Bradycardia, unspecified: Secondary | ICD-10-CM

## 2023-11-29 DIAGNOSIS — R5383 Other fatigue: Secondary | ICD-10-CM | POA: Diagnosis not present

## 2023-12-10 ENCOUNTER — Ambulatory Visit: Payer: Self-pay | Admitting: Nurse Practitioner

## 2023-12-10 MED ORDER — AMLODIPINE BESYLATE 2.5 MG PO TABS: 2.5000 mg | ORAL_TABLET | Freq: Every day | ORAL | 3 refills | Status: DC

## 2023-12-10 NOTE — Telephone Encounter (Signed)
-----   Message from Almarie Crate sent at 12/10/2023 10:16 AM EST ----- Chart reviewed.  Blood pressures are not at goal.  Want to avoid beta-blockers and AV nodal agents due to her heart rate.  Please begin amlodipine  2.5 mg daily and update us  in 2 to 3 weeks with her  BP readings.  Thanks!  Almarie Crate, AGNP-C ----- Message ----- From: Delbra Krebs T Sent: 11/18/2023   4:02 PM EST To: Almarie Crate, NP

## 2023-12-10 NOTE — Telephone Encounter (Signed)
 The patient has been notified of the result and verbalized understanding.  All questions (if any) were answered. Rosina JAYSON Cornea, CMA 12/10/2023 10:21 AM

## 2023-12-31 ENCOUNTER — Ambulatory Visit: Admitting: Nurse Practitioner

## 2023-12-31 VITALS — BP 148/60 | HR 45

## 2023-12-31 MED ORDER — AMLODIPINE BESYLATE 5 MG PO TABS: 5.0000 mg | ORAL_TABLET | Freq: Every day | ORAL | 6 refills | Status: DC

## 2023-12-31 MED ORDER — DILTIAZEM HCL ER COATED BEADS 180 MG PO CP24: 180.0000 mg | ORAL_CAPSULE | Freq: Every day | ORAL | 6 refills | Status: DC

## 2023-12-31 NOTE — Patient Instructions (Signed)
 Medication Instructions:   Decrease Diltiazem  CD to 180mg  daily Increase Norvasc  to 5mg  daily Continue all other medications.     Labwork:  none  Testing/Procedures:  none  Follow-Up:  6 weeks   Any Other Special Instructions Will Be Listed Below (If Applicable).  Please update the office in 2-3 weeks with BP readings   If you need a refill on your cardiac medications before your next appointment, please call your pharmacy.

## 2023-12-31 NOTE — Progress Notes (Unsigned)
 Cardiology Office Note   Date:  12/31/2023 ID:  [name], [name] Oct 20, 1950, MRN [id number] PCP: [name], MD  [facility] Providers Cardiologist:  Maude Emmer, MD     History of Present Illness Patricia Sharp is a 73 y.o. female with a PMH of A-fib, HTN, peripheral edema, and obesity, who presents today for scheduled follow-up. Followed by [facility] and EP.   Last seen by Laymon Qua, PA-C on 06/16/2021. Noted some occasional palpitations, but otherwise was overall doing well. In the interim, she has been followed by the A-fib Clinic and EP.   11/18/2023 - She is here for follow-up. She presents several chief concerns today. Admits to fatigue and shortness of breath with exertion, feels like she has no stamina. Does admit to occasional twinges of chest discomfort along the center of her chest, difficult to describe, denies any association to exertion. Brief in duration, but hard to tell how long it lasts. Does have some leg weakness at times that she attributes to her lower lumbar back pain. Does present BP log with SBP overall not at goal and showing elevated readings and HR averaging around 47 bpm. Denies any palpitations, syncope, presyncope, orthopnea, PND, swelling or significant weight changes, acute bleeding, or claudication. Does have some slight dizziness, denies any orthostatic symptoms.   12/31/2023 - Here for follow-up. Did have recent episode where she felt dizzy and lightheaded while on the couch, brief and unsure of what caused this. She does show me her BP and HR log since las visit that shows average SBP in 140's - 150's and HR dropping into the low 40's. Says legs feel tired and feels weak when walking. Denies any recent chest pain, shortness of breath, palpitations, syncope, presyncope, orthopnea, PND, swelling or significant weight changes, acute bleeding, or claudication.  ROS: Negative.   Studies Reviewed  EKG: EKG is not ordered today.   Cardiac  monitor 11/2023:    Patch Wear Time:  7 days and 0 hours (2025-11-03T14:43:50-0500 to 2025-11-10T15:00:13-0500)   Patient had a min HR of 39 bpm, max HR of 171 bpm, and avg HR of 53 bpm. Predominant underlying rhythm was Sinus Rhythm. 5 Ventricular Tachycardia runs occurred, the run with the fastest interval lasting 8 beats with a max rate of 171 bpm (avg 157 bpm);  the run with the fastest interval was also the longest. Atrial Fibrillation occurred (8% burden), ranging from 53-134 bpm (avg of 88 bpm), the longest lasting 10 hours 54 mins with an avg rate of 87 bpm. Atrial Fibrillation was detected within +/- 45  seconds of symptomatic patient event(s). Isolated SVEs were rare (<1.0%), SVE Couplets were rare (<1.0%), and no SVE Triplets were present. Isolated VEs were rare (<1.0%, 2659), VE Couplets were rare (<1.0%, 541), and VE Triplets were rare (<1.0%, 66).    Maude Emmer MD [facility]   Echo 03/2021:  1. Left ventricular ejection fraction, by estimation, is 60 to 65%. The  left ventricle has normal function. The left ventricle has no regional  wall motion abnormalities. There is mild left ventricular hypertrophy.  Left ventricular diastolic parameters  are indeterminate.   2. Right ventricular systolic function is normal. The right ventricular  size is normal. Tricuspid regurgitation signal is inadequate for assessing  PA pressure.   3. Left atrial size was severely dilated.   4. Right atrial size was mildly dilated.   5. The mitral valve is normal in structure. Trivial mitral valve  regurgitation. No evidence  of mitral stenosis.   6. The aortic valve is tricuspid. Aortic valve regurgitation is mild. No  aortic stenosis is present.   7. The inferior vena cava is normal in size with greater than 50%  respiratory variability, suggesting right atrial pressure of 3 mmHg.   Lexiscan  12/2017: No diagnostic ST segment changes to indicate ischemia. Occasional to frequent PVCs and PACs noted during  infusion. Small, mild intensity, apical anterior defect that is fixed and consistent with breast attenuation. No definite ischemic defects. This is a low risk study. Nuclear stress EF: 69%.      Physical Exam VS:  BP (!) 148/60   Pulse (!) 45   SpO2 98%        Wt Readings from Last 3 Encounters:  11/18/23 166 lb 3.2 oz (75.4 kg)  07/24/23 170 lb (77.1 kg)  05/15/23 165 lb (74.8 kg)    GEN: Obese 73 y.o. female in no acute distress NECK: No JVD; No carotid bruits CARDIAC: S1/S2, RRR, no murmurs, rubs, gallops RESPIRATORY:  Clear to auscultation without rales, wheezing or rhonchi  ABDOMEN: Soft, non-tender, non-distended EXTREMITIES:  Trace pitting edema to BLE (R>L - chronic per patient's report); No deformity   ASSESSMENT AND PLAN  Tachybrady syndrome, Bradycardia, Paroxysmal A-fib, fatigue She is symptomatic with low HR's on current medication regimen. Will decrease diltiazem  to 180 mg daily. Heart healthy diet and regular cardiovascular exercise encouraged.  Care and ED precautions discussed.  Continue Xarelto  for stroke prevention. Will consult EP about Tikosyn . Continue follow-up with EP and A-fib clinic.  HTN Home BPs are elevated not at goal.  Decreasing her diltiazem  as noted above, but will increase her Norvasc  to 5 mg daily.  Discussed to monitor BP at home at least 2 hours after medications and sitting for 5-10 minutes.  No other medication changes at this time. Heart healthy diet and regular cardiovascular exercise encouraged. She will update us  in 2-3 weeks with her readings via MyChart.   Leg edema, leg weakness She does appear to have some type of spinal disease, possibly lumbar disc disease that is affecting her sensation of leg weakness.  Leg edema is secondary due to chronic venous insufficiency.  Varicose veins noted on exam.  Slightly increased in right lower extremity compared to left lower extremity, has had past knee replacement in this leg.  Continue current  medication regimen.  Obesity   Weight loss via diet and exercise encouraged. Discussed the impact being overweight would have on cardiovascular risk.     Dispo: Care and ED precautions discussed. Follow-up with MD/APP in 6 weeks or sooner any changes.  Signed, Almarie Crate, NP

## 2024-01-18 ENCOUNTER — Telehealth: Payer: Self-pay | Admitting: Student in an Organized Health Care Education/Training Program

## 2024-01-18 ENCOUNTER — Other Ambulatory Visit: Payer: Self-pay | Admitting: Student in an Organized Health Care Education/Training Program

## 2024-01-18 NOTE — Telephone Encounter (Signed)
 Reviewed chart after message from [facility]. Patricia Sharp has an appt with me on 03/13/24 in [place]. On Tikosyn  and diltiazem  CD 180 mg daily. Has an appt with Almarie on 02/06.  She still feels generally fatigued which seems to possibly correlate to bradycardia.  She is symptomatic with her AF with recent monitor showing an 8% burden that correlated to symptoms as well.  I reviewed her TTE m from 2023 which showed severely dilated LA and mildly dilated RA which I presume is no better now than it was then.  Her bradycardia is going to limit ongoing antiarrhythmic medication options as well as rate control.  We discussed the future potential need for pacemaker but for now I would just like to get her off of medications and see if we could come up with a better strategy for her AF management.  We discussed the risk, benefits and alternatives of ablation over the phone.  I will arrange for an expedited appointment in [place] as I am not in [place] with additional availability before her appointment.  She is agreeable to this.  Will stop her diltiazem  today since she is persistently bradycardic.

## 2024-02-21 ENCOUNTER — Encounter: Payer: Self-pay | Admitting: Nurse Practitioner

## 2024-02-21 ENCOUNTER — Ambulatory Visit: Admitting: Nurse Practitioner

## 2024-02-21 VITALS — BP 158/72 | HR 96 | Ht 61.0 in | Wt 160.6 lb

## 2024-02-21 DIAGNOSIS — I4891 Unspecified atrial fibrillation: Secondary | ICD-10-CM

## 2024-02-21 MED ORDER — AMLODIPINE BESYLATE 5 MG PO TABS: 7.5000 mg | ORAL_TABLET | Freq: Every day | ORAL | 5 refills | Status: AC

## 2024-02-21 NOTE — Patient Instructions (Signed)
 Medication Instructions:  Your physician has recommended you make the following change in your medication:  Increase Amlodipine  to 7.5 mg once daily Continue taking all other medications as prescribed   Labwork: None  Testing/Procedures: None   Follow-Up: Your physician recommends that you schedule a follow-up appointment in: 6 months  Any Other Special Instructions Will Be Listed Below (If Applicable). Thank you for choosing [facility]!     If you need a refill on your cardiac medications before your next appointment, please call your pharmacy.

## 2024-02-21 NOTE — Progress Notes (Unsigned)
 " Cardiology Office Note   Date:  12/31/2023 ID:  Patricia, Sharp October 25, 1950, MRN [id number] PCP: Patricia Norleen PEDLAR, MD  [facility] Providers Cardiologist:  Patricia Emmer, MD     History of Present Illness Patricia Sharp is a 74 y.o. female with a PMH of A-fib, HTN, peripheral edema, and obesity, who presents today for scheduled follow-up. Followed by [facility] and EP.   Last seen by Laymon Qua, PA-C on 06/16/2021. Noted some occasional palpitations, but otherwise was overall doing well. In the interim, she has been followed by the A-fib Clinic and EP.   11/18/2023 - She is here for follow-up. She presents several chief concerns today. Admits to fatigue and shortness of breath with exertion, feels like she has no stamina. Does admit to occasional twinges of chest discomfort along the center of her chest, difficult to describe, denies any association to exertion. Brief in duration, but hard to tell how long it lasts. Does have some leg weakness at times that she attributes to her lower lumbar back pain. Does present BP log with SBP overall not at goal and showing elevated readings and HR averaging around 47 bpm. Denies any palpitations, syncope, presyncope, orthopnea, PND, swelling or significant weight changes, acute bleeding, or claudication. Does have some slight dizziness, denies any orthostatic symptoms.   12/31/2023 - Here for follow-up. Did have recent episode where she felt dizzy and lightheaded while on the couch, brief and unsure of what caused this. She does show me her BP and HR log since las visit that shows average SBP in 140's - 150's and HR dropping into the low 40's. Says legs feel tired and feels weak when walking. Denies any recent chest pain, shortness of breath, palpitations, syncope, presyncope, orthopnea, PND, swelling or significant weight changes, acute bleeding, or claudication.  ROS: Negative.   Studies Reviewed  EKG: EKG is not ordered today.   Cardiac  monitor 11/2023:    Patch Wear Time:  7 days and 0 hours (2025-11-03T14:43:50-0500 to 2025-11-10T15:00:13-0500)   Patient had a min HR of 39 bpm, max HR of 171 bpm, and avg HR of 53 bpm. Predominant underlying rhythm was Sinus Rhythm. 5 Ventricular Tachycardia runs occurred, the run with the fastest interval lasting 8 beats with a max rate of 171 bpm (avg 157 bpm);  the run with the fastest interval was also the longest. Atrial Fibrillation occurred (8% burden), ranging from 53-134 bpm (avg of 88 bpm), the longest lasting 10 hours 54 mins with an avg rate of 87 bpm. Atrial Fibrillation was detected within +/- 45  seconds of symptomatic patient event(s). Isolated SVEs were rare (<1.0%), SVE Couplets were rare (<1.0%), and no SVE Triplets were present. Isolated VEs were rare (<1.0%, 2659), VE Couplets were rare (<1.0%, 541), and VE Triplets were rare (<1.0%, 66).    Patricia Emmer MD [facility]   Echo 03/2021:  1. Left ventricular ejection fraction, by estimation, is 60 to 65%. The  left ventricle has normal function. The left ventricle has no regional  wall motion abnormalities. There is mild left ventricular hypertrophy.  Left ventricular diastolic parameters  are indeterminate.   2. Right ventricular systolic function is normal. The right ventricular  size is normal. Tricuspid regurgitation signal is inadequate for assessing  PA pressure.   3. Left atrial size was severely dilated.   4. Right atrial size was mildly dilated.   5. The mitral valve is normal in structure. Trivial mitral valve  regurgitation. No  evidence of mitral stenosis.   6. The aortic valve is tricuspid. Aortic valve regurgitation is mild. No  aortic stenosis is present.   7. The inferior vena cava is normal in size with greater than 50%  respiratory variability, suggesting right atrial pressure of 3 mmHg.   Lexiscan  12/2017: No diagnostic ST segment changes to indicate ischemia. Occasional to frequent PVCs and PACs noted during  infusion. Small, mild intensity, apical anterior defect that is fixed and consistent with breast attenuation. No definite ischemic defects. This is a low risk study. Nuclear stress EF: 69%.      Physical Exam VS:  BP (!) 158/72 (BP Location: Left Arm)   Pulse 96   Ht 5' 1 (1.549 m)   Wt 160 lb 9.6 oz (72.8 kg)   SpO2 96%   BMI 30.35 kg/m        Wt Readings from Last 3 Encounters:  02/21/24 160 lb 9.6 oz (72.8 kg)  11/18/23 166 lb 3.2 oz (75.4 kg)  07/24/23 170 lb (77.1 kg)    GEN: Obese 74 y.o. female in no acute distress NECK: No JVD; No carotid bruits CARDIAC: S1/S2, RRR, no murmurs, rubs, gallops RESPIRATORY:  Clear to auscultation without rales, wheezing or rhonchi  ABDOMEN: Soft, non-tender, non-distended EXTREMITIES:  Trace pitting edema to BLE (R>L - chronic per patient's report); No deformity   ASSESSMENT AND PLAN  Tachybrady syndrome, Bradycardia, Paroxysmal A-fib, fatigue She is symptomatic with low HR's on current medication regimen. Will decrease diltiazem  to 180 mg daily. Heart healthy diet and regular cardiovascular exercise encouraged.  Care and ED precautions discussed.  Continue Xarelto  for stroke prevention. Will consult EP about Tikosyn . Continue follow-up with EP and A-fib clinic.  HTN Home BPs are elevated not at goal.  Decreasing her diltiazem  as noted above, but will increase her Norvasc  to 5 mg daily.  Discussed to monitor BP at home at least 2 hours after medications and sitting for 5-10 minutes.  No other medication changes at this time. Heart healthy diet and regular cardiovascular exercise encouraged. She will update us  in 2-3 weeks with her readings via MyChart.   Leg edema, leg weakness She does appear to have some type of spinal disease, possibly lumbar disc disease that is affecting her sensation of leg weakness.  Leg edema is secondary due to chronic venous insufficiency.  Varicose veins noted on exam.  Slightly increased in right lower extremity  compared to left lower extremity, has had past knee replacement in this leg.  Continue current medication regimen.  Obesity   Weight loss via diet and exercise encouraged. Discussed the impact being overweight would have on cardiovascular risk.  CKD....      Dispo: Care and ED precautions discussed. Follow-up with MD/APP in 6 weeks or sooner any changes.  Signed, Almarie Crate, NP   "

## 2024-03-13 ENCOUNTER — Ambulatory Visit: Admitting: Student in an Organized Health Care Education/Training Program
